# Patient Record
Sex: Female | Born: 1950 | Race: Black or African American | Hispanic: No | Smoking: Never smoker
Health system: Southern US, Community
[De-identification: ages and names within clinical notes are randomized; demographics above are authoritative.]

## PROBLEM LIST (undated history)

## (undated) DIAGNOSIS — I10 Essential (primary) hypertension: Secondary | ICD-10-CM

## (undated) DIAGNOSIS — G4730 Sleep apnea, unspecified: Secondary | ICD-10-CM

## (undated) DIAGNOSIS — E7800 Pure hypercholesterolemia, unspecified: Secondary | ICD-10-CM

## (undated) DIAGNOSIS — M1990 Unspecified osteoarthritis, unspecified site: Secondary | ICD-10-CM

## (undated) HISTORY — PX: FRACTURE SURGERY: SHX138

## (undated) HISTORY — PX: ABDOMINAL HYSTERECTOMY: SHX81

---

## 2000-08-11 ENCOUNTER — Emergency Department (HOSPITAL_COMMUNITY): Admission: EM | Admit: 2000-08-11 | Discharge: 2000-08-11 | Payer: Self-pay | Admitting: *Deleted

## 2000-12-22 ENCOUNTER — Encounter: Payer: Self-pay | Admitting: Family Medicine

## 2000-12-22 ENCOUNTER — Ambulatory Visit (HOSPITAL_COMMUNITY): Admission: RE | Admit: 2000-12-22 | Discharge: 2000-12-22 | Payer: Self-pay | Admitting: Family Medicine

## 2001-02-09 ENCOUNTER — Emergency Department (HOSPITAL_COMMUNITY): Admission: EM | Admit: 2001-02-09 | Discharge: 2001-02-10 | Payer: Self-pay | Admitting: *Deleted

## 2001-04-02 ENCOUNTER — Emergency Department (HOSPITAL_COMMUNITY): Admission: EM | Admit: 2001-04-02 | Discharge: 2001-04-02 | Payer: Self-pay | Admitting: Emergency Medicine

## 2001-04-02 ENCOUNTER — Encounter: Payer: Self-pay | Admitting: *Deleted

## 2001-12-24 ENCOUNTER — Ambulatory Visit (HOSPITAL_COMMUNITY): Admission: RE | Admit: 2001-12-24 | Discharge: 2001-12-24 | Payer: Self-pay | Admitting: Family Medicine

## 2001-12-24 ENCOUNTER — Encounter: Payer: Self-pay | Admitting: Family Medicine

## 2002-03-01 ENCOUNTER — Ambulatory Visit (HOSPITAL_BASED_OUTPATIENT_CLINIC_OR_DEPARTMENT_OTHER): Admission: RE | Admit: 2002-03-01 | Discharge: 2002-03-01 | Payer: Self-pay | Admitting: Orthopedic Surgery

## 2002-03-17 ENCOUNTER — Encounter (HOSPITAL_COMMUNITY): Admission: RE | Admit: 2002-03-17 | Discharge: 2002-04-16 | Payer: Self-pay | Admitting: Orthopedic Surgery

## 2002-04-18 ENCOUNTER — Encounter (HOSPITAL_COMMUNITY): Admission: RE | Admit: 2002-04-18 | Discharge: 2002-05-18 | Payer: Self-pay | Admitting: Orthopedic Surgery

## 2002-05-23 ENCOUNTER — Encounter (HOSPITAL_COMMUNITY): Admission: RE | Admit: 2002-05-23 | Discharge: 2002-06-22 | Payer: Self-pay | Admitting: Orthopedic Surgery

## 2002-06-22 ENCOUNTER — Encounter (HOSPITAL_COMMUNITY): Admission: RE | Admit: 2002-06-22 | Discharge: 2002-07-22 | Payer: Self-pay | Admitting: Orthopedic Surgery

## 2003-03-13 ENCOUNTER — Ambulatory Visit (HOSPITAL_COMMUNITY): Admission: RE | Admit: 2003-03-13 | Discharge: 2003-03-13 | Payer: Self-pay | Admitting: Family Medicine

## 2003-03-19 ENCOUNTER — Emergency Department (HOSPITAL_COMMUNITY): Admission: EM | Admit: 2003-03-19 | Discharge: 2003-03-19 | Payer: Self-pay | Admitting: Emergency Medicine

## 2004-05-09 ENCOUNTER — Ambulatory Visit (HOSPITAL_COMMUNITY): Admission: RE | Admit: 2004-05-09 | Discharge: 2004-05-09 | Payer: Self-pay | Admitting: Family Medicine

## 2004-06-08 ENCOUNTER — Emergency Department (HOSPITAL_COMMUNITY): Admission: EM | Admit: 2004-06-08 | Discharge: 2004-06-08 | Payer: Self-pay | Admitting: *Deleted

## 2004-07-30 ENCOUNTER — Emergency Department (HOSPITAL_COMMUNITY): Admission: EM | Admit: 2004-07-30 | Discharge: 2004-07-30 | Payer: Self-pay | Admitting: *Deleted

## 2005-03-04 ENCOUNTER — Ambulatory Visit (HOSPITAL_COMMUNITY): Admission: RE | Admit: 2005-03-04 | Discharge: 2005-03-04 | Payer: Self-pay | Admitting: Family Medicine

## 2005-03-24 ENCOUNTER — Emergency Department (HOSPITAL_COMMUNITY): Admission: EM | Admit: 2005-03-24 | Discharge: 2005-03-24 | Payer: Self-pay | Admitting: Emergency Medicine

## 2005-04-04 ENCOUNTER — Ambulatory Visit (HOSPITAL_COMMUNITY): Admission: RE | Admit: 2005-04-04 | Discharge: 2005-04-04 | Payer: Self-pay | Admitting: Family Medicine

## 2005-05-19 ENCOUNTER — Emergency Department (HOSPITAL_COMMUNITY): Admission: EM | Admit: 2005-05-19 | Discharge: 2005-05-19 | Payer: Self-pay | Admitting: Emergency Medicine

## 2005-07-04 ENCOUNTER — Ambulatory Visit (HOSPITAL_COMMUNITY): Admission: RE | Admit: 2005-07-04 | Discharge: 2005-07-04 | Payer: Self-pay | Admitting: Family Medicine

## 2005-07-22 ENCOUNTER — Ambulatory Visit: Admission: RE | Admit: 2005-07-22 | Discharge: 2005-07-22 | Payer: Self-pay | Admitting: Family Medicine

## 2005-08-07 ENCOUNTER — Ambulatory Visit: Payer: Self-pay | Admitting: Pulmonary Disease

## 2006-05-21 IMAGING — US US ABDOMEN COMPLETE
1 series · 14 of 25 positions shown · non-contrast
Comparison: none

CLINICAL DATA: Abdominal pain.
 ABDOMEN ULTRASOUND:
TECHNIQUE: Complete abdominal ultrasound examination was performed including evaluation of the liver, gallbladder, bile ducts, pancreas, kidneys, spleen, IVC, and abdominal aorta.

[Series 1: unknown · 0.33mm/px · 14 of 58 slices shown]
[im 1/58]
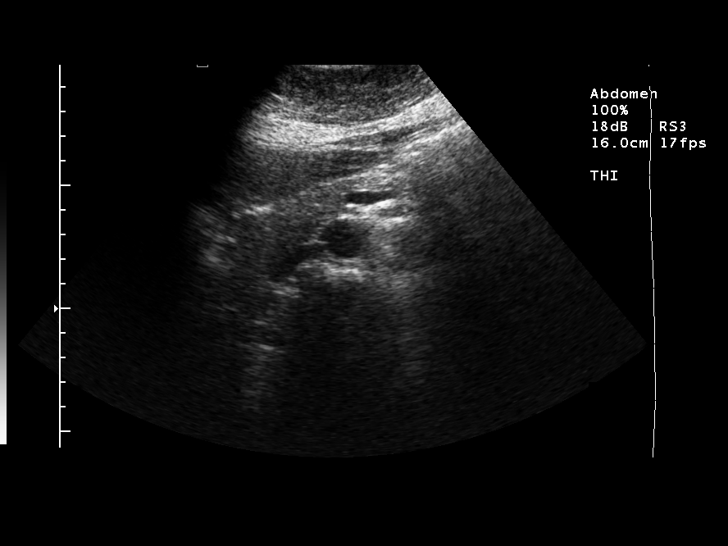
[im 5/58]
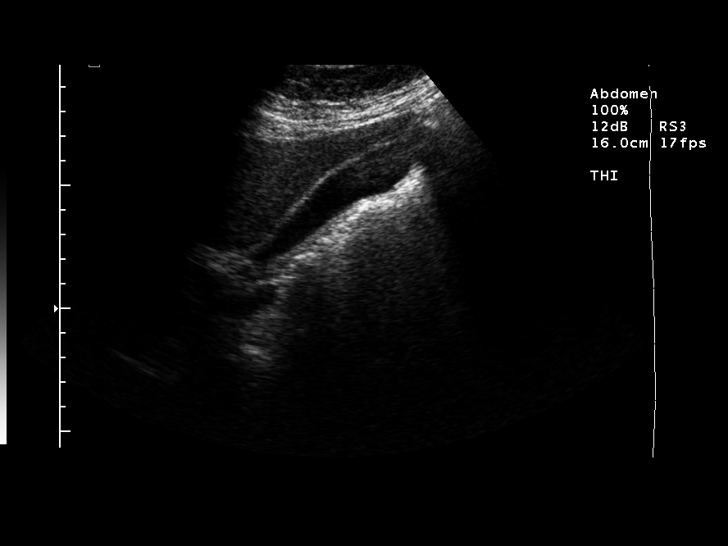
[im 10/58]
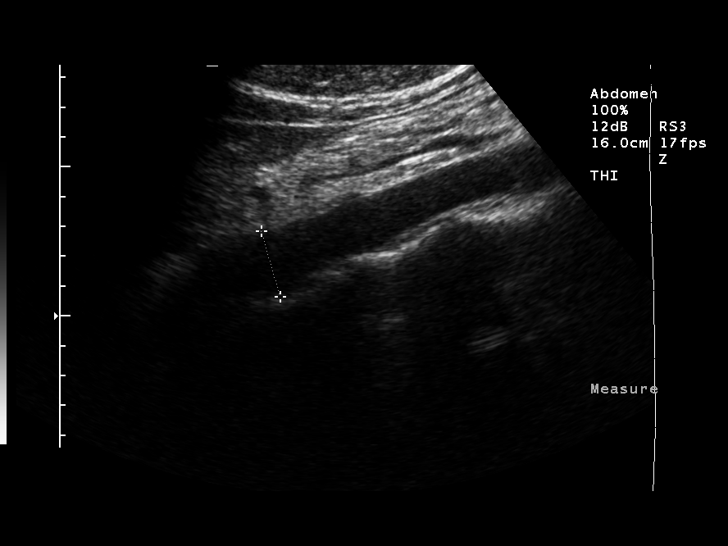
[im 15/58]
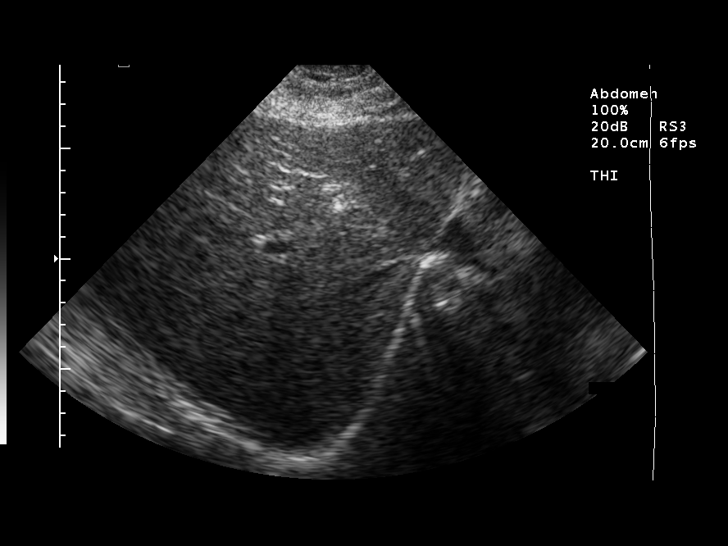
[im 20/58]
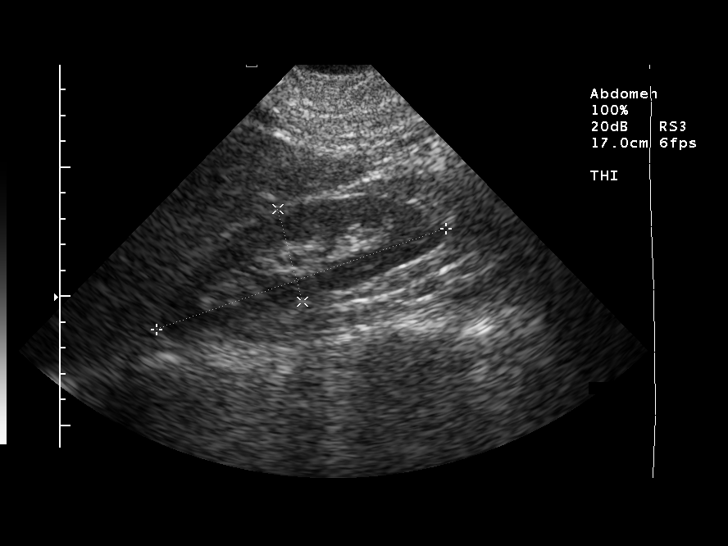
[im 22/58]
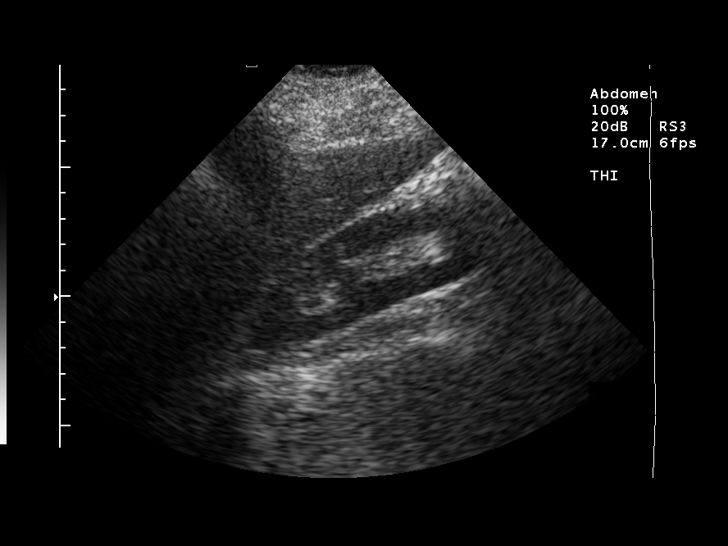
[im 27/58]
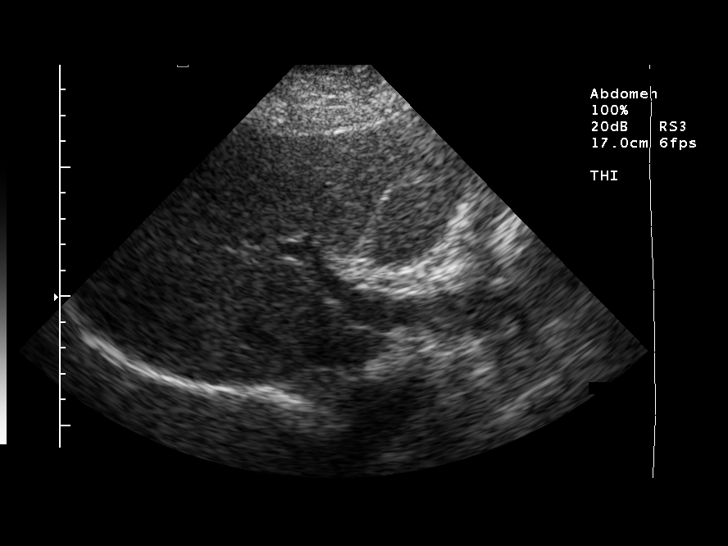
[im 31/58]
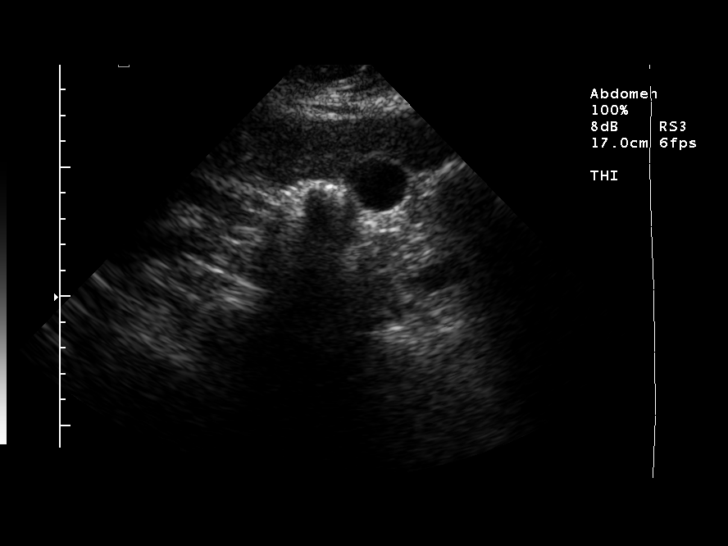
[im 36/58]
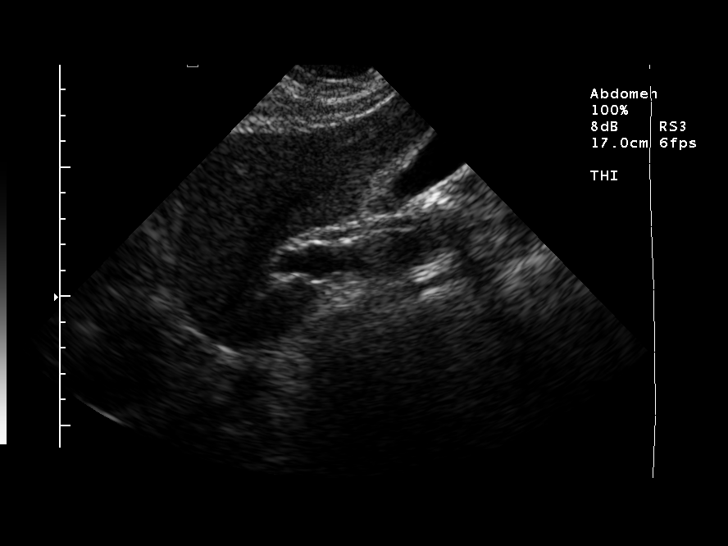
[im 39/58]
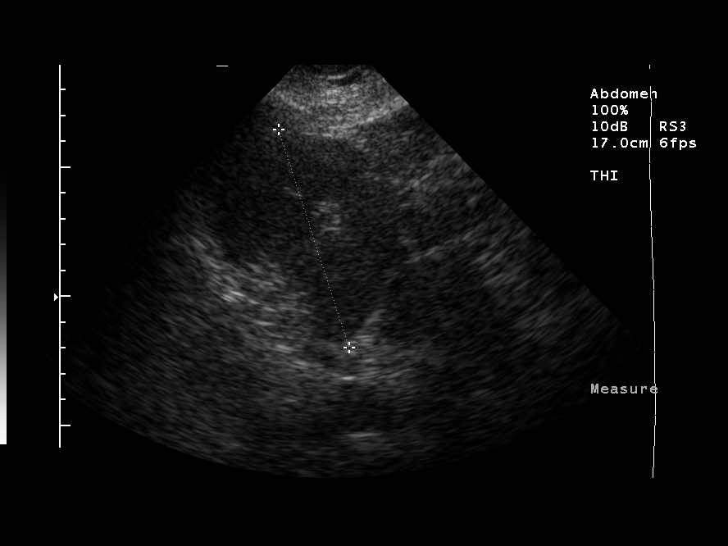
[im 43/58]
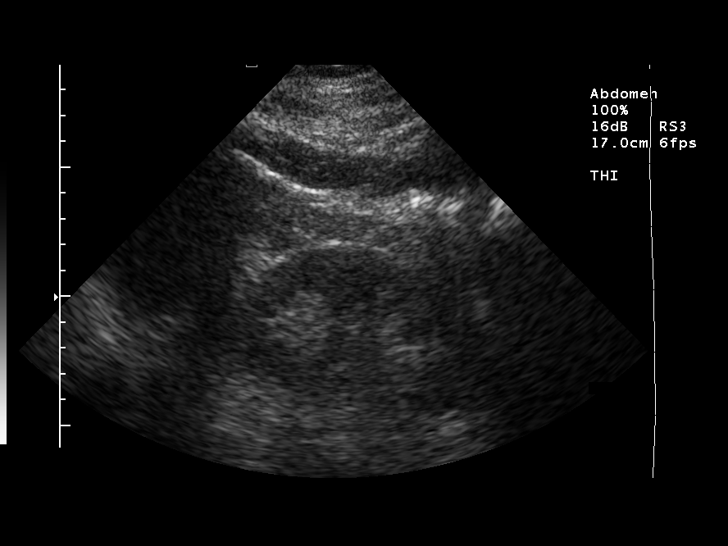
[im 48/58]
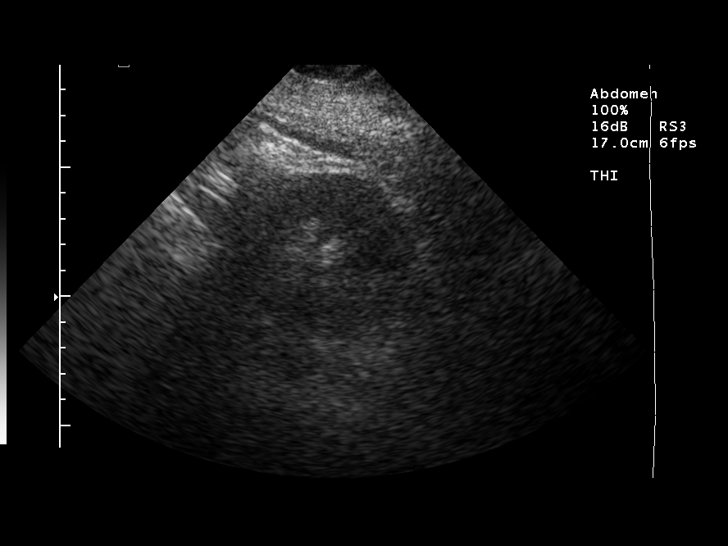
[im 53/58]
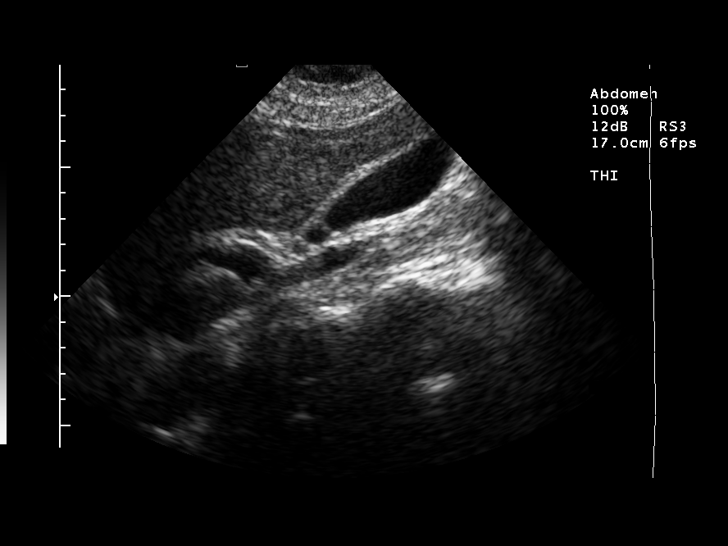
[im 58/58]
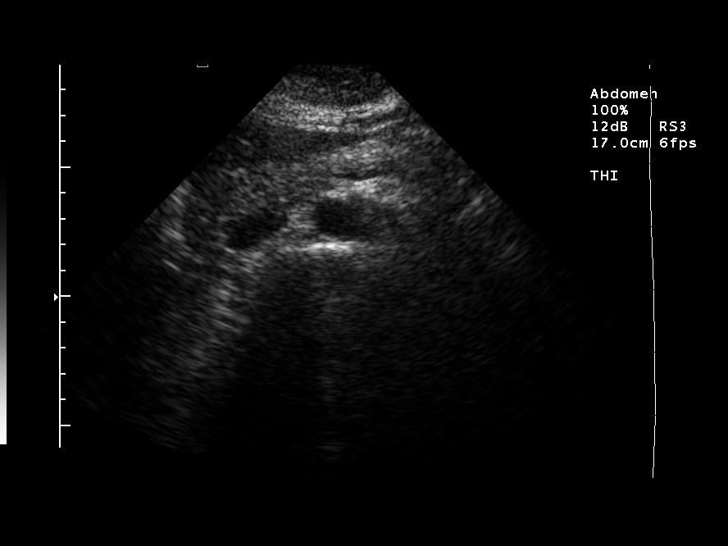

[14 of 25 positions shown; findings below may reference images not displayed]

FINDINGS: Multiple scans of the abdomen are made and show the gallbladder to be normal with a wall thickness of 2.6 mm.  The common bile duct is normal and measures 5.2 mm in diameter.  The liver, inferior vena cava and pancreas are normal.  Spleen is normal and measures 8.7 cm.  The right kidney is normal and measures 11.8 cm.  The left kidney is normal and measures 11.2 cm.  Abdominal aorta is normal and measures 2.3 cm.
IMPRESSION: Normal complete abdominal ultrasound.

## 2007-01-28 ENCOUNTER — Emergency Department (HOSPITAL_COMMUNITY): Admission: EM | Admit: 2007-01-28 | Discharge: 2007-01-28 | Payer: Self-pay | Admitting: Emergency Medicine

## 2007-02-16 ENCOUNTER — Ambulatory Visit (HOSPITAL_COMMUNITY): Admission: RE | Admit: 2007-02-16 | Discharge: 2007-02-16 | Payer: Self-pay | Admitting: Family Medicine

## 2007-03-14 ENCOUNTER — Emergency Department (HOSPITAL_COMMUNITY): Admission: EM | Admit: 2007-03-14 | Discharge: 2007-03-14 | Payer: Self-pay | Admitting: Emergency Medicine

## 2007-03-15 ENCOUNTER — Emergency Department (HOSPITAL_COMMUNITY): Admission: EM | Admit: 2007-03-15 | Discharge: 2007-03-15 | Payer: Self-pay | Admitting: Emergency Medicine

## 2007-05-20 ENCOUNTER — Emergency Department (HOSPITAL_COMMUNITY): Admission: EM | Admit: 2007-05-20 | Discharge: 2007-05-20 | Payer: Self-pay | Admitting: Emergency Medicine

## 2007-06-22 ENCOUNTER — Emergency Department (HOSPITAL_COMMUNITY): Admission: EM | Admit: 2007-06-22 | Discharge: 2007-06-22 | Payer: Self-pay | Admitting: Emergency Medicine

## 2007-06-25 ENCOUNTER — Emergency Department (HOSPITAL_COMMUNITY): Admission: EM | Admit: 2007-06-25 | Discharge: 2007-06-25 | Payer: Self-pay | Admitting: Emergency Medicine

## 2007-11-28 ENCOUNTER — Emergency Department (HOSPITAL_COMMUNITY): Admission: EM | Admit: 2007-11-28 | Discharge: 2007-11-28 | Payer: Self-pay | Admitting: Emergency Medicine

## 2008-03-15 ENCOUNTER — Ambulatory Visit (HOSPITAL_COMMUNITY): Admission: RE | Admit: 2008-03-15 | Discharge: 2008-03-15 | Payer: Self-pay | Admitting: Family Medicine

## 2008-10-02 ENCOUNTER — Ambulatory Visit (HOSPITAL_COMMUNITY): Admission: RE | Admit: 2008-10-02 | Discharge: 2008-10-02 | Payer: Self-pay | Admitting: Pulmonary Disease

## 2008-10-16 ENCOUNTER — Ambulatory Visit (HOSPITAL_COMMUNITY): Admission: RE | Admit: 2008-10-16 | Discharge: 2008-10-16 | Payer: Self-pay | Admitting: Pulmonary Disease

## 2008-10-21 ENCOUNTER — Emergency Department (HOSPITAL_COMMUNITY): Admission: EM | Admit: 2008-10-21 | Discharge: 2008-10-21 | Payer: Self-pay | Admitting: Emergency Medicine

## 2009-03-19 ENCOUNTER — Emergency Department (HOSPITAL_COMMUNITY): Admission: EM | Admit: 2009-03-19 | Discharge: 2009-03-19 | Payer: Self-pay | Admitting: Emergency Medicine

## 2009-04-16 ENCOUNTER — Ambulatory Visit (HOSPITAL_COMMUNITY): Admission: RE | Admit: 2009-04-16 | Discharge: 2009-04-16 | Payer: Self-pay | Admitting: Family Medicine

## 2009-04-24 ENCOUNTER — Emergency Department (HOSPITAL_COMMUNITY): Admission: EM | Admit: 2009-04-24 | Discharge: 2009-04-24 | Payer: Self-pay | Admitting: Emergency Medicine

## 2009-09-25 ENCOUNTER — Emergency Department (HOSPITAL_COMMUNITY): Admission: EM | Admit: 2009-09-25 | Discharge: 2009-09-25 | Payer: Self-pay | Admitting: Emergency Medicine

## 2010-03-20 ENCOUNTER — Emergency Department (HOSPITAL_COMMUNITY)
Admission: EM | Admit: 2010-03-20 | Discharge: 2010-03-20 | Disposition: A | Discharge status: Home / Self Care | Payer: Self-pay | Attending: Emergency Medicine | Admitting: Emergency Medicine

## 2010-03-20 DIAGNOSIS — M7660 Achilles tendinitis, unspecified leg: Secondary | ICD-10-CM | POA: Insufficient documentation

## 2010-03-20 DIAGNOSIS — I10 Essential (primary) hypertension: Secondary | ICD-10-CM | POA: Insufficient documentation

## 2010-03-20 DIAGNOSIS — M25579 Pain in unspecified ankle and joints of unspecified foot: Secondary | ICD-10-CM | POA: Insufficient documentation

## 2010-03-23 ENCOUNTER — Emergency Department (HOSPITAL_COMMUNITY)
Admission: EM | Admit: 2010-03-23 | Discharge: 2010-03-23 | Disposition: A | Discharge status: Home / Self Care | Payer: Self-pay | Attending: Emergency Medicine | Admitting: Emergency Medicine

## 2010-03-23 DIAGNOSIS — H538 Other visual disturbances: Secondary | ICD-10-CM | POA: Insufficient documentation

## 2010-03-23 DIAGNOSIS — J45909 Unspecified asthma, uncomplicated: Secondary | ICD-10-CM | POA: Insufficient documentation

## 2010-03-23 DIAGNOSIS — I10 Essential (primary) hypertension: Secondary | ICD-10-CM | POA: Insufficient documentation

## 2010-03-23 DIAGNOSIS — Z79899 Other long term (current) drug therapy: Secondary | ICD-10-CM | POA: Insufficient documentation

## 2010-04-08 ENCOUNTER — Other Ambulatory Visit (HOSPITAL_COMMUNITY): Payer: Self-pay | Admitting: Family Medicine

## 2010-04-08 DIAGNOSIS — Z139 Encounter for screening, unspecified: Secondary | ICD-10-CM

## 2010-05-03 ENCOUNTER — Ambulatory Visit (HOSPITAL_COMMUNITY)
Admission: RE | Admit: 2010-05-03 | Discharge: 2010-05-03 | Disposition: A | Discharge status: Home / Self Care | Payer: PRIVATE HEALTH INSURANCE | Source: Ambulatory Visit | Attending: Family Medicine | Admitting: Family Medicine

## 2010-05-03 DIAGNOSIS — Z139 Encounter for screening, unspecified: Secondary | ICD-10-CM

## 2010-05-03 DIAGNOSIS — Z1231 Encounter for screening mammogram for malignant neoplasm of breast: Secondary | ICD-10-CM | POA: Insufficient documentation

## 2010-05-10 LAB — BLOOD GAS, ARTERIAL
Acid-Base Excess: 1.3 mmol/L (ref 0.0–2.0)
Bicarbonate: 25 mEq/L — ABNORMAL HIGH (ref 20.0–24.0)
FIO2: 0.21 %
O2 Saturation: 97.1 %
Patient temperature: 37
TCO2: 21.8 mmol/L (ref 0–100)
pCO2 arterial: 37.4 mmHg (ref 35.0–45.0)

## 2010-06-21 NOTE — Op Note (Signed)
Sheri Fleming, Sheri Fleming                         ACCOUNT NO.:  [id number]   MEDICAL RECORD NO.:  [id number]                   PATIENT TYPE:  AMB   LOCATION:  DSC                                  FACILITY:  [facility]   PHYSICIAN:  Leonides Grills, M.D.                  DATE OF BIRTH:  [birth year]   DATE OF PROCEDURE:  03/01/2002  DATE OF DISCHARGE:                                 OPERATIVE REPORT   PREOPERATIVE DIAGNOSES:  1. Left ankle complication of hardware, status post open reduction and     internal fixation bimalleolar ankle fracture.  2. Left dorsal medial talar spurs.  3. Left anterior medial tibial spurs.   POSTOPERATIVE DIAGNOSES:  1. Left ankle complication of hardware, status post open reduction and     internal fixation bimalleolar ankle fracture.  2. Left dorsal medial talar spurs.  3. Left anterior medial tibial spurs.  4. Anterior talar dome osteochondral lesion.   PROCEDURE:  1. Hardware removal deep left ankle.  2. Excision of left tibial spurs.  3. Excision of left talar spurs.  4. Left ankle arthrotomy with drilling of anterior medial talar dome     osteochondral lesion.   SURGEON:  Leonides Grills, M.D.   ASSISTANT:  Lianne Cure, P.A.   ANESTHESIA:  General endotracheal.   ESTIMATED BLOOD LOSS:  Minimal.   TOURNIQUET TIME:  Approximately 1/2 hour.   COMPLICATIONS:  Stable.   INDICATIONS:  This is a 60 year old female who has had longstanding ankle  pain since she has had open reduction and internal fixation of a bimalleolar  ankle fracture.  She was consented for the above procedure.  All risks which  include infection, neurovascular injury, stiffness, arthritis, persistent  pain, worsening of pain, possible ankle fusion in the future were all  explained.  Questions were encouraged and answered.   DESCRIPTION OF PROCEDURE:  The patient was brought to the operating room and  placed in the supine position.  After adequate general endotracheal  anesthesia was administered as well as Ancef 1 g IV piggyback, the left  lower extremity was then prepped and draped in a sterile manner with a  proximally placed thigh tourniquet.  The left limb was gravity  exsanguinated, and the tourniquet was elevated to 290 mmHg.  We went through  the previous medial approach that was used for open reduction internal  fixation of her medial malleolar ankle fracture.  Dissection was carried  down to the medial malleolus.  Hemostasis was obtained.  Soft tissues were  elevated anteriorly to the medial malleolus.  There was a large spur on the  dorsal medial aspect of the talar dome that was impinging the corner of the  tibia, and there was a kissing osteophyte off the tibia as well that  extended along the entire anterior aspect of the medial malleolus, tibia  region down to the corner where the screw and  wire were removed with needle-  nose pliers and screwdrivers, respectively.  Both were intact without any  evidence of hardware failure.  We then with the curved 0.25 inch osteotome,  these spurs were removed off the tibia and talus, respectively.  There was  an approximately 4 x 3 mm osteochondral lesion on the anterior medial aspect  of the talar dome with a 6Q K-wire.  Multiple drill holes were then placed  in this area as well.  The wound was copiously irrigated with normal saline.  All loose bodies and debris were removed meteciously with rongeur and  irrigation.  The tourniquet was deflated.  Hemostasis was obtained.  The  wound was closed in layers with 3-0 Vicryl and 4-0 nylon, respectively.  Cam  walker boot was applied.  Sterile dressing was applied.  The patient was  stable to the PR.                                                   Leonides Grills, M.D.    PB/MEDQ  D:  03/01/2002  T:  03/01/2002  Job:  [id number]

## 2010-06-21 NOTE — Procedures (Signed)
Sheri Fleming, Sheri Fleming               ACCOUNT NO.:  [id number]   MEDICAL RECORD NO.:  [id number]          PATIENT TYPE:  OUT   LOCATION:  SLEEP LAB                     FACILITY:  [facility]   PHYSICIAN:  Marcelyn Bruins, M.D. [facility] DATE OF BIRTH:  [birth year]   DATE OF STUDY:  07/22/2005                              NOCTURNAL POLYSOMNOGRAM   REFERRING PHYSICIAN:  Dr. Lilyan Punt   INDICATION FOR STUDY:  Hypersomnia with sleep apnea.   EPWORTH SCORE:  13.   SLEEP ARCHITECTURE:  The patient had a total sleep time of 316 minutes with  significantly decreased slow wave sleep as well as REM.  Sleep onset latency  was normal and REM onset was prolonged at 164 minutes.  Sleep efficiency was  decreased at 74%.   RESPIRATORY DATA:  Patient was found to have 56 hypopneas and 43 apneas for  a respiratory disturbance index of 19 events per hour.  The events occurred  in all body positions, but were more common in the supine position.  There  was moderate to loud snoring noted throughout.   OXYGEN DATA:  The patient had O2 desaturation as low as 88% with her  obstructive events.   CARDIAC DATA:  Frequent PACs were noted throughout.   MOVEMENT/PARASOMNIA:  Patient was found to have small numbers of leg jerks  with little to no sleep disruption.   IMPRESSION/RECOMMENDATION:  1.  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  The patient did not meet split night criteria secondary      to the majority of her events occurring well after 2 a.m.  Treatment for      this degree of sleep apnea should focus on weight loss alone if      applicable, upper airway surgery, oral appliance, and also CPAP.      Clinical correlation is      suggested.  2.  Frequent PACs noted throughout.                                          ______________________________  Marcelyn Bruins, M.D. [facility]  Diplomate,  American Board of Sleep  Medicine    KC/MEDQ  D:  08/08/2005 11:33:56  T:  08/08/2005 12:13:55  Job:  [id number]

## 2010-10-30 LAB — BASIC METABOLIC PANEL
BUN: 12
Calcium: 8.8
Creatinine, Ser: 0.78

## 2010-10-30 LAB — DIFFERENTIAL
Basophils Absolute: 0
Basophils Relative: 0
Lymphocytes Relative: 5 — ABNORMAL LOW
Monocytes Absolute: 0.2
Monocytes Relative: 3
Neutro Abs: 6.2

## 2010-10-30 LAB — URINALYSIS, ROUTINE W REFLEX MICROSCOPIC
Glucose, UA: NEGATIVE
Ketones, ur: NEGATIVE
Nitrite: NEGATIVE
Protein, ur: NEGATIVE
Urobilinogen, UA: 0.2
pH: 6

## 2010-10-30 LAB — URINE MICROSCOPIC-ADD ON

## 2010-10-30 LAB — CBC
HCT: 39.8
Hemoglobin: 13.9
RBC: 4.56
RDW: 12.7
WBC: 6.8

## 2010-12-27 ENCOUNTER — Emergency Department (HOSPITAL_COMMUNITY): Payer: Self-pay

## 2010-12-27 ENCOUNTER — Emergency Department (HOSPITAL_COMMUNITY)
Admission: EM | Admit: 2010-12-27 | Discharge: 2010-12-28 | Disposition: A | Discharge status: Home / Self Care | Payer: Self-pay | Attending: Emergency Medicine | Admitting: Emergency Medicine

## 2010-12-27 DIAGNOSIS — J45909 Unspecified asthma, uncomplicated: Secondary | ICD-10-CM | POA: Insufficient documentation

## 2010-12-27 DIAGNOSIS — J40 Bronchitis, not specified as acute or chronic: Secondary | ICD-10-CM | POA: Insufficient documentation

## 2010-12-27 DIAGNOSIS — I10 Essential (primary) hypertension: Secondary | ICD-10-CM | POA: Insufficient documentation

## 2010-12-27 HISTORY — DX: Essential (primary) hypertension: I10

## 2010-12-27 MED ORDER — IPRATROPIUM BROMIDE 0.02 % IN SOLN
0.5000 mg | Freq: Once | RESPIRATORY_TRACT | Status: AC
  Administered 2010-12-27: 0.5 mg via RESPIRATORY_TRACT
  Filled 2010-12-27: qty 2.5

## 2010-12-27 MED ORDER — ALBUTEROL SULFATE (5 MG/ML) 0.5% IN NEBU
5.0000 mg | INHALATION_SOLUTION | Freq: Once | RESPIRATORY_TRACT | Status: AC
  Administered 2010-12-27: 5 mg via RESPIRATORY_TRACT
  Filled 2010-12-27: qty 1

## 2010-12-27 NOTE — ED Notes (Signed)
Pt reports productive cough recently, using nebs and inhalers without relief.

## 2010-12-27 NOTE — ED Provider Notes (Signed)
History     CSN: [id number] Arrival date & time: 12/27/2010 11:15 PM   First MD Initiated Contact with Patient 12/27/10 2316      Chief Complaint  Patient presents with  . Cough  . Wheezing    (Consider location/radiation/quality/duration/timing/severity/associated sxs/prior treatment) HPI Pt presents with c/o asthma flare.  She states she has had URI symptom with nasal congestion and cough productive of yellow sputum.  Has been using albuterol twice daily with some relief.  Has run out of her singulair.  No fever/chills.  No vomiting, no abdominal pain or chest pain.   Has continued to drink liquids well.  No leg swelling.  Albuterol seems to help her symptoms, nothing makes them worse.  No other systemic symptoms.  Has a hx of prior asthma exacerbations- has not been intubated, last steriods approx 1 year ago.  Past Medical History  Diagnosis Date  . Asthma   . Hypertension     History reviewed. No pertinent past surgical history.  No family history on file.  History  Substance Use Topics  . Smoking status: Never Smoker   . Smokeless tobacco: Not on file  . Alcohol Use: No    OB History    Grav Para Term Preterm Abortions TAB SAB Ect Mult Living                  Review of Systems ROS reviewed and otherwise negative except for mentioned in HPI  Allergies  Review of patient's allergies indicates no known allergies.  Home Medications   Current Outpatient Rx  Name Route Sig Dispense Refill  . ALBUTEROL SULFATE (2.5 MG/3ML) 0.083% IN NEBU Nebulization Take 2.5 mg by nebulization every 6 (six) hours as needed.      Marland Kitchen MONTELUKAST SODIUM 10 MG PO TABS Oral Take 10 mg by mouth at bedtime.      Marland Kitchen MONTELUKAST SODIUM 10 MG PO TABS Oral Take 1 tablet (10 mg total) by mouth at bedtime. 30 tablet 0    BP 192/107  Pulse 87  Temp(Src) 98.3 F (36.8 C) (Oral)  Resp 20  Ht 5' 3.5" (1.613 m)  Wt 180 lb (81.647 kg)  BMI 31.39 kg/m2  SpO2 99% Vitals reviewed Physical  Exam Physical Examination: General appearance - alert, well appearing, and in no distress Mental status - alert, oriented to person, place, and time Eyes - pupils equal and reactive, no conjunctival injection Mouth - mucous membranes moist, pharynx normal without lesions Lymphatics - no palpable lymphadenopathy Chest - clear to auscultation, no wheezes, rales or rhonchi, symmetric air entry Heart - normal rate, regular rhythm, normal S1, S2, no murmurs, rubs, clicks or gallops Abdomen - soft, nontender, nondistended, no masses or organomegaly Neurological - motor and sensory grossly normal bilaterally Musculoskeletal - no joint tenderness, deformity or swelling Extremities - peripheral pulses normal, no pedal edema, no clubbing or cyanosis Skin - normal coloration and turgor, no rashes  ED Course  Procedures (including critical care time)   Labs Reviewed  POCT I-STAT, CHEM 8  I-STAT, CHEM 8   Dg Chest 2 View  12/27/2010  *RADIOLOGY REPORT*  Clinical Data: Wheezing, productive cough and congestion; history of asthma.  CHEST - 2 VIEW  Comparison: Chest radiograph performed 04/24/2009  Findings: The lungs are well-aerated and clear.  There is no evidence of focal opacification, pleural effusion or pneumothorax.  The heart is normal in size; the mediastinal contour is within normal limits.  No acute osseous abnormalities are seen.  There is elevation of the left hemidiaphragm.  IMPRESSION: No acute cardiopulmonary process seen.  Original Report Authenticated By: Tonia Ghent, M.D.     1. Bronchitis   2. Asthma   3. Hypertension       MDM  Pt with URI symptoms and sensation of asthma exacerbation- she also c/o productive cough.  CXR was negative for acute process.  Pt without wheezing on exam- has been using albuterol only twice daily- no indication for steroid therapy at this time- advised patient to maximize bronchidilator therapy.  She is also hypertensive- istat reassuring- no  signs of end organ damage.  She also needs a refill of her singulair.  Discharged with strict return precautions.  Pt agreeable with plan.         Ethelda Chick, MD 12/28/10 [phone]

## 2010-12-28 LAB — POCT I-STAT, CHEM 8
Chloride: 104 mEq/L (ref 96–112)
Hemoglobin: 12.9 g/dL (ref 12.0–15.0)
Potassium: 3.9 mEq/L (ref 3.5–5.1)
Sodium: 142 mEq/L (ref 135–145)

## 2010-12-28 MED ORDER — MONTELUKAST SODIUM 10 MG PO TABS: 10.0000 mg | ORAL_TABLET | Freq: Every day | ORAL | Status: DC

## 2011-05-08 ENCOUNTER — Other Ambulatory Visit (HOSPITAL_COMMUNITY): Payer: Self-pay | Admitting: Physician Assistant

## 2011-05-08 DIAGNOSIS — Z1231 Encounter for screening mammogram for malignant neoplasm of breast: Secondary | ICD-10-CM

## 2011-05-12 ENCOUNTER — Ambulatory Visit (HOSPITAL_COMMUNITY)
Admission: RE | Admit: 2011-05-12 | Discharge: 2011-05-12 | Disposition: A | Discharge status: Home / Self Care | Payer: Self-pay | Source: Ambulatory Visit | Attending: Physician Assistant | Admitting: Physician Assistant

## 2011-05-12 DIAGNOSIS — Z1231 Encounter for screening mammogram for malignant neoplasm of breast: Secondary | ICD-10-CM

## 2012-06-01 ENCOUNTER — Emergency Department (HOSPITAL_COMMUNITY): Payer: Self-pay

## 2012-06-01 ENCOUNTER — Emergency Department (HOSPITAL_COMMUNITY)
Admission: EM | Admit: 2012-06-01 | Discharge: 2012-06-01 | Disposition: A | Discharge status: Home / Self Care | Payer: Self-pay | Attending: Emergency Medicine | Admitting: Emergency Medicine

## 2012-06-01 ENCOUNTER — Encounter (HOSPITAL_COMMUNITY): Payer: Self-pay | Admitting: *Deleted

## 2012-06-01 DIAGNOSIS — I10 Essential (primary) hypertension: Secondary | ICD-10-CM | POA: Insufficient documentation

## 2012-06-01 DIAGNOSIS — Z8639 Personal history of other endocrine, nutritional and metabolic disease: Secondary | ICD-10-CM | POA: Insufficient documentation

## 2012-06-01 DIAGNOSIS — R05 Cough: Secondary | ICD-10-CM | POA: Insufficient documentation

## 2012-06-01 DIAGNOSIS — Z862 Personal history of diseases of the blood and blood-forming organs and certain disorders involving the immune mechanism: Secondary | ICD-10-CM | POA: Insufficient documentation

## 2012-06-01 DIAGNOSIS — J209 Acute bronchitis, unspecified: Secondary | ICD-10-CM | POA: Insufficient documentation

## 2012-06-01 DIAGNOSIS — J309 Allergic rhinitis, unspecified: Secondary | ICD-10-CM | POA: Insufficient documentation

## 2012-06-01 DIAGNOSIS — J302 Other seasonal allergic rhinitis: Secondary | ICD-10-CM

## 2012-06-01 DIAGNOSIS — R059 Cough, unspecified: Secondary | ICD-10-CM | POA: Insufficient documentation

## 2012-06-01 DIAGNOSIS — Z79899 Other long term (current) drug therapy: Secondary | ICD-10-CM | POA: Insufficient documentation

## 2012-06-01 DIAGNOSIS — J45901 Unspecified asthma with (acute) exacerbation: Secondary | ICD-10-CM | POA: Insufficient documentation

## 2012-06-01 HISTORY — DX: Pure hypercholesterolemia, unspecified: E78.00

## 2012-06-01 HISTORY — DX: Sleep apnea, unspecified: G47.30

## 2012-06-01 MED ORDER — PREDNISONE 10 MG PO TABS: 20.0000 mg | ORAL_TABLET | Freq: Every day | ORAL | Status: DC

## 2012-06-01 MED ORDER — ALBUTEROL SULFATE (2.5 MG/3ML) 0.083% IN NEBU: 2.5000 mg | INHALATION_SOLUTION | Freq: Four times a day (QID) | RESPIRATORY_TRACT | Status: DC | PRN

## 2012-06-01 MED ORDER — BENZONATATE 100 MG PO CAPS: 100.0000 mg | ORAL_CAPSULE | Freq: Three times a day (TID) | ORAL | Status: DC

## 2012-06-01 MED ORDER — LORATADINE 10 MG PO TABS: 10.0000 mg | ORAL_TABLET | Freq: Every day | ORAL | Status: DC

## 2012-06-01 NOTE — ED Notes (Signed)
Pt c/o cough that is productive with sputum, no color noted, runny nose, wheezing, has to use her inhalers more frequently without improvement at home, denies any pain.

## 2012-06-01 NOTE — ED Provider Notes (Deleted)
History     CSN: [id number]  Arrival date & time 06/01/12  1726   First MD Initiated Contact with Patient 06/01/12 1758      Chief Complaint  Patient presents with  . Asthma    (Consider location/radiation/quality/duration/timing/severity/associated sxs/prior treatment) HPI  Past Medical History  Diagnosis Date  . Asthma   . Hypertension   . Sleep apnea   . High cholesterol     Past Surgical History  Procedure Laterality Date  . Abdominal hysterectomy      No family history on file.  History  Substance Use Topics  . Smoking status: Never Smoker   . Smokeless tobacco: Not on file  . Alcohol Use: No    OB History   Grav Para Term Preterm Abortions TAB SAB Ect Mult Living                  Review of Systems  Allergies  Review of patient's allergies indicates no known allergies.  Home Medications   Current Outpatient Rx  Name  Route  Sig  Dispense  Refill  . albuterol (PROVENTIL) (2.5 MG/3ML) 0.083% nebulizer solution   Nebulization   Take 3 mLs (2.5 mg total) by nebulization every 6 (six) hours as needed.   75 mL   0   . benzonatate (TESSALON) 100 MG capsule   Oral   Take 1 capsule (100 mg total) by mouth every 8 (eight) hours.   21 capsule   0   . loratadine (CLARITIN) 10 MG tablet   Oral   Take 1 tablet (10 mg total) by mouth daily. One po daily x 5 days   5 tablet   0   . montelukast (SINGULAIR) 10 MG tablet   Oral   Take 10 mg by mouth at bedtime.           Marland Kitchen EXPIRED: montelukast (SINGULAIR) 10 MG tablet   Oral   Take 1 tablet (10 mg total) by mouth at bedtime.   30 tablet   0   . predniSONE (DELTASONE) 10 MG tablet   Oral   Take 2 tablets (20 mg total) by mouth daily.   10 tablet   0     BP 137/71  Pulse 82  Temp(Src) 98.5 F (36.9 C) (Oral)  Resp 20  SpO2 100%  Physical Exam  ED Course  Procedures (including critical care time)  Labs Reviewed - No data to display Dg Chest 2 View  06/01/2012  *RADIOLOGY REPORT*   Clinical Data: Productive cough.  Rhinorrhea.  Wheezing.  Asthma.  CHEST - 2 VIEW  Comparison: 12/27/2010.  Findings: Stable normal sized heart and left diaphragmatic eventration.  Clear lungs.  Mild central peribronchial thickening with mild progression.  Thoracic spine degenerative changes.  IMPRESSION: Mild bronchitic changes with mild progression.   Original Report Authenticated By: Beckie Salts, M.D.      1. Bronchitis with bronchospasm   2. Seasonal allergies       MDM  I personally performed the services described in this documentation, which was scribed in my presence. The recorded information has been reviewed and is accurate.          Loren Racer, MD 06/01/12 [phone]

## 2012-06-01 NOTE — ED Notes (Signed)
Pt alert & oriented x4, stable gait. Patient given discharge instructions, paperwork & prescription(s). Patient  instructed to stop at the registration desk to finish any additional paperwork. Patient verbalized understanding. Pt left department w/ no further questions. 

## 2012-06-01 NOTE — ED Provider Notes (Signed)
History  This chart was scribed for Loren Racer, MD, by Candelaria Stagers, ED Scribe. This patient was seen in room [facility] and the patient's care was started at 6:05 PM   CSN: [id number]  Arrival date & time 06/01/12  1726   First MD Initiated Contact with Patient 06/01/12 1758      Chief Complaint  Patient presents with  . Asthma     The history is provided by the patient. No language interpreter was used.   Sheri Fleming is a 62 y.o. female who presents to the Emergency Department complaining of  exacerbation of asthma that started today including wheezing and productive cough.  Pt has h/o asthma and reports worsening symptoms with seasonal allergies.  Pt has used prescribed inhaler with some improvements.  She reports she has home breathing treatments, but is out of the medication.    Past Medical History  Diagnosis Date  . Asthma   . Hypertension   . Sleep apnea   . High cholesterol     Past Surgical History  Procedure Laterality Date  . Abdominal hysterectomy      No family history on file.  History  Substance Use Topics  . Smoking status: Never Smoker   . Smokeless tobacco: Not on file  . Alcohol Use: No    OB History   Grav Para Term Preterm Abortions TAB SAB Ect Mult Living                  Review of Systems  Respiratory: Positive for cough and wheezing.   All other systems reviewed and are negative.    Allergies  Review of patient's allergies indicates no known allergies.  Home Medications   Current Outpatient Rx  Name  Route  Sig  Dispense  Refill  . albuterol (PROVENTIL) (2.5 MG/3ML) 0.083% nebulizer solution   Nebulization   Take 3 mLs (2.5 mg total) by nebulization every 6 (six) hours as needed.   75 mL   0   . benzonatate (TESSALON) 100 MG capsule   Oral   Take 1 capsule (100 mg total) by mouth every 8 (eight) hours.   21 capsule   0   . loratadine (CLARITIN) 10 MG tablet   Oral   Take 1 tablet (10 mg total) by mouth  daily. One po daily x 5 days   5 tablet   0   . montelukast (SINGULAIR) 10 MG tablet   Oral   Take 10 mg by mouth at bedtime.           Marland Kitchen EXPIRED: montelukast (SINGULAIR) 10 MG tablet   Oral   Take 1 tablet (10 mg total) by mouth at bedtime.   30 tablet   0   . predniSONE (DELTASONE) 10 MG tablet   Oral   Take 2 tablets (20 mg total) by mouth daily.   10 tablet   0     BP 137/71  Pulse 82  Temp(Src) 98.5 F (36.9 C) (Oral)  Resp 20  SpO2 100%  Physical Exam  Nursing note and vitals reviewed. Constitutional: She is oriented to person, place, and time. She appears well-developed and well-nourished.  HENT:  Head: Normocephalic and atraumatic.  Mouth/Throat: Oropharynx is clear and moist. No oropharyngeal exudate.  Eyes: Conjunctivae and EOM are normal. Pupils are equal, round, and reactive to light.  Neck: Normal range of motion.  Cardiovascular: Normal rate, regular rhythm and normal heart sounds.   Pulmonary/Chest: Effort normal and breath sounds  normal. She has no wheezes. She has no rales.  Abdominal: Soft. Bowel sounds are normal. There is no tenderness.  Musculoskeletal: Normal range of motion. She exhibits no edema and no tenderness.  Neurological: She is alert and oriented to person, place, and time.  Skin: Skin is warm and dry.  Psychiatric: She has a normal mood and affect.    ED Course  Procedures   DIAGNOSTIC STUDIES: Oxygen Saturation is 100% on room air, normal by my interpretation.    COORDINATION OF CARE:  6:15 PM Discussed course of care with pt which includes chest xray and steroids.  Pt understands and agrees.  Labs Reviewed - No data to display No results found.   1. Bronchitis with bronchospasm   2. Seasonal allergies       MDM  I personally performed the services described in this documentation, which was scribed in my presence. The recorded information has been reviewed and is accurate.         Loren Racer,  MD 06/11/12 1650

## 2012-10-27 ENCOUNTER — Other Ambulatory Visit (HOSPITAL_COMMUNITY): Payer: Self-pay | Admitting: Physician Assistant

## 2012-10-27 DIAGNOSIS — Z139 Encounter for screening, unspecified: Secondary | ICD-10-CM

## 2012-11-15 ENCOUNTER — Ambulatory Visit (HOSPITAL_COMMUNITY): Payer: Self-pay

## 2012-11-21 ENCOUNTER — Encounter (HOSPITAL_COMMUNITY): Payer: Self-pay | Admitting: Emergency Medicine

## 2012-11-21 ENCOUNTER — Emergency Department (HOSPITAL_COMMUNITY)
Admission: EM | Admit: 2012-11-21 | Discharge: 2012-11-21 | Disposition: A | Discharge status: Home / Self Care | Payer: Self-pay | Attending: Emergency Medicine | Admitting: Emergency Medicine

## 2012-11-21 ENCOUNTER — Emergency Department (HOSPITAL_COMMUNITY): Payer: Self-pay

## 2012-11-21 DIAGNOSIS — J45901 Unspecified asthma with (acute) exacerbation: Secondary | ICD-10-CM | POA: Insufficient documentation

## 2012-11-21 DIAGNOSIS — Z79899 Other long term (current) drug therapy: Secondary | ICD-10-CM | POA: Insufficient documentation

## 2012-11-21 DIAGNOSIS — J069 Acute upper respiratory infection, unspecified: Secondary | ICD-10-CM | POA: Insufficient documentation

## 2012-11-21 DIAGNOSIS — E7800 Pure hypercholesterolemia, unspecified: Secondary | ICD-10-CM | POA: Insufficient documentation

## 2012-11-21 DIAGNOSIS — I10 Essential (primary) hypertension: Secondary | ICD-10-CM | POA: Insufficient documentation

## 2012-11-21 DIAGNOSIS — IMO0002 Reserved for concepts with insufficient information to code with codable children: Secondary | ICD-10-CM | POA: Insufficient documentation

## 2012-11-21 MED ORDER — IPRATROPIUM BROMIDE 0.02 % IN SOLN
0.5000 mg | Freq: Once | RESPIRATORY_TRACT | Status: AC
  Administered 2012-11-21: 0.5 mg via RESPIRATORY_TRACT
  Filled 2012-11-21: qty 2.5

## 2012-11-21 MED ORDER — PREDNISONE 20 MG PO TABS: 40.0000 mg | ORAL_TABLET | Freq: Every day | ORAL | Status: DC

## 2012-11-21 MED ORDER — ALBUTEROL SULFATE (5 MG/ML) 0.5% IN NEBU
5.0000 mg | INHALATION_SOLUTION | Freq: Once | RESPIRATORY_TRACT | Status: AC
  Administered 2012-11-21: 5 mg via RESPIRATORY_TRACT
  Filled 2012-11-21: qty 1

## 2012-11-21 MED ORDER — PREDNISONE 50 MG PO TABS
60.0000 mg | ORAL_TABLET | Freq: Once | ORAL | Status: AC
  Administered 2012-11-21: 60 mg via ORAL
  Filled 2012-11-21 (×2): qty 1

## 2012-11-21 MED ORDER — BENZONATATE 100 MG PO CAPS: 100.0000 mg | ORAL_CAPSULE | Freq: Three times a day (TID) | ORAL | Status: DC | PRN

## 2012-11-21 MED ORDER — ALBUTEROL SULFATE (2.5 MG/3ML) 0.083% IN NEBU: 2.5000 mg | INHALATION_SOLUTION | RESPIRATORY_TRACT | Status: DC | PRN

## 2012-11-21 NOTE — ED Provider Notes (Signed)
CSN: [id number]     Arrival date & time 11/21/12  1543 History   First MD Initiated Contact with Patient 11/21/12 1553     Chief Complaint  Patient presents with  . Shortness of Breath    HPI Pt was seen at 1610.   Per pt, c/o gradual onset and worsening of persistent cough, wheezing and SOB for the past 2 days.  Describes her symptoms as "my asthma is acting up."  Has been using home MDI with transient relief. States she has run out of her neb solution. Has been associated with runny/stuffy nose and sinus congestion. Denies CP/palpitations, no back pain, no abd pain, no N/V/D, no fevers, no rash.    Past Medical History  Diagnosis Date  . Asthma   . Hypertension   . Sleep apnea   . High cholesterol    Past Surgical History  Procedure Laterality Date  . Abdominal hysterectomy      History  Substance Use Topics  . Smoking status: Never Smoker   . Smokeless tobacco: Not on file  . Alcohol Use: No    Review of Systems ROS: Statement: All systems negative except as marked or noted in the HPI; Constitutional: Negative for fever and chills. ; ; Eyes: Negative for eye pain, redness and discharge. ; ; ENMT: Negative for ear pain, hoarseness, sore throat. +nasal congestion, rhinorrhea, sinus pressure. ; ; Cardiovascular: Negative for chest pain, palpitations, diaphoresis and peripheral edema. ; ; Respiratory: +cough, wheezing, SOB. Negative for stridor. ; ; Gastrointestinal: Negative for nausea, vomiting, diarrhea, abdominal pain, blood in stool, hematemesis, jaundice and rectal bleeding. . ; ; Genitourinary: Negative for dysuria, flank pain and hematuria. ; ; Musculoskeletal: Negative for back pain and neck pain. Negative for swelling and trauma.; ; Skin: Negative for pruritus, rash, abrasions, blisters, bruising and skin lesion.; ; Neuro: Negative for headache, lightheadedness and neck stiffness. Negative for weakness, altered level of consciousness , altered mental status, extremity  weakness, paresthesias, involuntary movement, seizure and syncope.       Allergies  Review of patient's allergies indicates no known allergies.  Home Medications   Current Outpatient Rx  Name  Route  Sig  Dispense  Refill  . albuterol (PROVENTIL) (2.5 MG/3ML) 0.083% nebulizer solution   Nebulization   Take 3 mLs (2.5 mg total) by nebulization every 6 (six) hours as needed.   75 mL   0   . benzonatate (TESSALON) 100 MG capsule   Oral   Take 1 capsule (100 mg total) by mouth every 8 (eight) hours.   21 capsule   0   . loratadine (CLARITIN) 10 MG tablet   Oral   Take 1 tablet (10 mg total) by mouth daily. One po daily x 5 days   5 tablet   0   . montelukast (SINGULAIR) 10 MG tablet   Oral   Take 10 mg by mouth at bedtime.           Marland Kitchen EXPIRED: montelukast (SINGULAIR) 10 MG tablet   Oral   Take 1 tablet (10 mg total) by mouth at bedtime.   30 tablet   0   . predniSONE (DELTASONE) 10 MG tablet   Oral   Take 2 tablets (20 mg total) by mouth daily.   10 tablet   0    BP 143/83  Pulse 109  Temp(Src) 99.7 F (37.6 C) (Oral)  Resp 18  SpO2 98% Physical Exam 1615: Physical examination:  Nursing notes reviewed; Vital signs  and O2 SAT reviewed;  Constitutional: Well developed, Well nourished, Well hydrated, In no acute distress; Head:  Normocephalic, atraumatic; Eyes: EOMI, PERRL, No scleral icterus; ENMT: TM's clear bilat. +edemetous nasal turbinates bilat with clear rhinorrhea. Mouth and pharynx normal, Mucous membranes moist; Neck: Supple, Full range of motion, No lymphadenopathy; Cardiovascular: Regular rate and rhythm, No gallop; Respiratory: Breath sounds diminished & equal bilaterally, faint scattered wheezes. +non-productive cough during exam. Speaking full sentences with ease, Normal respiratory effort/excursion; Chest: Nontender, Movement normal; Abdomen: Soft, Nontender, Nondistended, Normal bowel sounds; Genitourinary: No CVA tenderness; Extremities: Pulses  normal, No tenderness, No edema, No calf edema or asymmetry.; Neuro: AA&Ox3, Major CN grossly intact.  Speech clear. No gross focal motor or sensory deficits in extremities.; Skin: Color normal, Warm, Dry.   ED Course  Procedures     EKG Interpretation   None       MDM  MDM Reviewed: previous chart, nursing note and vitals Interpretation: x-ray     Dg Chest 2 View 11/21/2012   CLINICAL DATA:  Short of breath  EXAM: CHEST  2 VIEW  COMPARISON:  06/01/2012  FINDINGS: Normal heart size. Clear lungs. Left hemidiaphragm remains elevated. No pleural effusion. No pneumothorax.  IMPRESSION: No active cardiopulmonary disease.   Electronically Signed   By: Maryclare Bean M.D.   On: 11/21/2012 17:29     1745:  Pt states she "feels better" after neb and steroid.  NAD, lungs CTA bilat, no wheezing, resps easy, speaking full sentences, Sats 99% R/A. Has been ambulatory with steady gait, easy resps. Wants to go home now. Dx and testing d/w pt and family.  Questions answered.  Verb understanding, agreeable to d/c home with outpt f/u.  Marland Kitchen    Laray Anger, DO 11/23/12 1130

## 2012-11-21 NOTE — ED Notes (Signed)
Patient: -DC'd in no apparent distress with RR and effort WDL -ambulatory out of the ED with stable gait noted and companion at side -reported understanding DC instructions -stable vitals noted at DC -DC'd with 3 prescriptions

## 2012-11-21 NOTE — ED Notes (Addendum)
Pt reports has asthma.  C/O SOB, cough, and runny nose since yesterday.  Denies fever. Pt says is out of her albuterol nebulizer.

## 2012-11-21 NOTE — ED Notes (Signed)
Respiratory at bedside.

## 2012-11-21 NOTE — ED Notes (Signed)
Respiratory called and informed the patient is ready for breathing TX.

## 2012-11-29 ENCOUNTER — Ambulatory Visit (HOSPITAL_COMMUNITY): Payer: Self-pay

## 2013-01-03 ENCOUNTER — Ambulatory Visit (HOSPITAL_COMMUNITY)
Admission: RE | Admit: 2013-01-03 | Discharge: 2013-01-03 | Disposition: A | Discharge status: Home / Self Care | Payer: Self-pay | Source: Ambulatory Visit | Attending: Physician Assistant | Admitting: Physician Assistant

## 2013-01-03 DIAGNOSIS — Z139 Encounter for screening, unspecified: Secondary | ICD-10-CM | POA: Insufficient documentation

## 2013-03-26 ENCOUNTER — Emergency Department (HOSPITAL_COMMUNITY): Payer: Self-pay

## 2013-03-26 ENCOUNTER — Encounter (HOSPITAL_COMMUNITY): Payer: Self-pay | Admitting: Emergency Medicine

## 2013-03-26 ENCOUNTER — Emergency Department (HOSPITAL_COMMUNITY)
Admission: EM | Admit: 2013-03-26 | Discharge: 2013-03-26 | Disposition: A | Discharge status: Home / Self Care | Payer: Self-pay | Attending: Emergency Medicine | Admitting: Emergency Medicine

## 2013-03-26 DIAGNOSIS — J45909 Unspecified asthma, uncomplicated: Secondary | ICD-10-CM | POA: Insufficient documentation

## 2013-03-26 DIAGNOSIS — E7800 Pure hypercholesterolemia, unspecified: Secondary | ICD-10-CM | POA: Insufficient documentation

## 2013-03-26 DIAGNOSIS — I10 Essential (primary) hypertension: Secondary | ICD-10-CM | POA: Insufficient documentation

## 2013-03-26 DIAGNOSIS — J069 Acute upper respiratory infection, unspecified: Secondary | ICD-10-CM | POA: Insufficient documentation

## 2013-03-26 DIAGNOSIS — Z79899 Other long term (current) drug therapy: Secondary | ICD-10-CM | POA: Insufficient documentation

## 2013-03-26 MED ORDER — ALBUTEROL SULFATE (2.5 MG/3ML) 0.083% IN NEBU
INHALATION_SOLUTION | RESPIRATORY_TRACT | Status: AC
  Administered 2013-03-26: 2.5 mg via RESPIRATORY_TRACT
  Filled 2013-03-26: qty 3

## 2013-03-26 MED ORDER — ALBUTEROL SULFATE (2.5 MG/3ML) 0.083% IN NEBU
2.5000 mg | INHALATION_SOLUTION | Freq: Once | RESPIRATORY_TRACT | Status: AC
  Administered 2013-03-26 (×2): 2.5 mg via RESPIRATORY_TRACT

## 2013-03-26 MED ORDER — BENZONATATE 100 MG PO CAPS: 100.0000 mg | ORAL_CAPSULE | Freq: Three times a day (TID) | ORAL | Status: DC | PRN

## 2013-03-26 MED ORDER — IPRATROPIUM-ALBUTEROL 0.5-2.5 (3) MG/3ML IN SOLN
RESPIRATORY_TRACT | Status: AC
  Administered 2013-03-26: 3 mL via RESPIRATORY_TRACT
  Filled 2013-03-26: qty 3

## 2013-03-26 MED ORDER — IPRATROPIUM-ALBUTEROL 0.5-2.5 (3) MG/3ML IN SOLN
3.0000 mL | Freq: Once | RESPIRATORY_TRACT | Status: AC
  Administered 2013-03-26 (×2): 3 mL via RESPIRATORY_TRACT

## 2013-03-26 NOTE — ED Notes (Signed)
Pt c/o asthma flaring up. States that she has had a cold and noticed yesterday she was getting worse, admits to cough that is productive with yellow sputum, runny nose, congestion, has been using her inhaler with no relief in symptoms.

## 2013-03-26 NOTE — ED Notes (Signed)
Pt alert & oriented x4, stable gait. Patient given discharge instructions, paperwork & prescription(s). Patient  instructed to stop at the registration desk to finish any additional paperwork. Patient verbalized understanding. Pt left department w/ no further questions. 

## 2013-03-26 NOTE — ED Provider Notes (Signed)
CSN: [id number]     Arrival date & time 03/26/13  1558 History   First MD Initiated Contact with Patient 03/26/13 1609     Chief Complaint  Patient presents with  . Asthma     HPI Pt was seen at 1610.  Per pt, c/o gradual onset and persistence of constant runny/stuffy nose, sinus congestion, and cough for the past 2 to 3 days. Pt has been using her albuterol MDI without improvement in her cough. Denies wheezing, no SOB, no fevers, no rash, no CP/palpitations, no N/V/D, no abd pain.     Past Medical History  Diagnosis Date  . Asthma   . Hypertension   . Sleep apnea   . High cholesterol    Past Surgical History  Procedure Laterality Date  . Abdominal hysterectomy      History  Substance Use Topics  . Smoking status: Never Smoker   . Smokeless tobacco: Not on file  . Alcohol Use: No    Review of Systems ROS: Statement: All systems negative except as marked or noted in the HPI; Constitutional: Negative for fever and chills. ; ; Eyes: Negative for eye pain, redness and discharge. ; ; ENMT: Negative for ear pain, hoarseness, sore throat. +nasal congestion, sinus pressure. ; ; Cardiovascular: Negative for chest pain, palpitations, diaphoresis, dyspnea and peripheral edema. ; ; Respiratory: +cough. Negative for wheezing and stridor. ; ; Gastrointestinal: Negative for nausea, vomiting, diarrhea, abdominal pain, blood in stool, hematemesis, jaundice and rectal bleeding. . ; ; Genitourinary: Negative for dysuria, flank pain and hematuria. ; ; Musculoskeletal: Negative for back pain and neck pain. Negative for swelling and trauma.; ; Skin: Negative for pruritus, rash, abrasions, blisters, bruising and skin lesion.; ; Neuro: Negative for headache, lightheadedness and neck stiffness. Negative for weakness, altered level of consciousness , altered mental status, extremity weakness, paresthesias, involuntary movement, seizure and syncope.      Allergies  Review of patient's allergies indicates  no known allergies.  Home Medications   Current Outpatient Rx  Name  Route  Sig  Dispense  Refill  . albuterol (PROVENTIL HFA;VENTOLIN HFA) 108 (90 BASE) MCG/ACT inhaler   Inhalation   Inhale 2 puffs into the lungs every 6 (six) hours as needed for wheezing.         Marland Kitchen albuterol (PROVENTIL) (2.5 MG/3ML) 0.083% nebulizer solution   Nebulization   Take 3 mLs (2.5 mg total) by nebulization every 6 (six) hours as needed.   75 mL   0   . albuterol (PROVENTIL) (2.5 MG/3ML) 0.083% nebulizer solution   Nebulization   Take 3 mLs (2.5 mg total) by nebulization every 4 (four) hours as needed for wheezing.   75 mL   0   . benzonatate (TESSALON) 100 MG capsule   Oral   Take 1 capsule (100 mg total) by mouth 3 (three) times daily as needed for cough.   15 capsule   0   . lisinopril-hydrochlorothiazide (PRINZIDE,ZESTORETIC) 20-12.5 MG per tablet   Oral   Take 1 tablet by mouth every morning.         Marland Kitchen EXPIRED: montelukast (SINGULAIR) 10 MG tablet   Oral   Take 1 tablet (10 mg total) by mouth at bedtime.   30 tablet   0   . pravastatin (PRAVACHOL) 20 MG tablet   Oral   Take 20 mg by mouth at bedtime.         . predniSONE (DELTASONE) 20 MG tablet   Oral   Take  2 tablets (40 mg total) by mouth daily.   10 tablet   0    BP 181/98  Pulse 90  Temp(Src) 98.3 F (36.8 C) (Oral)  Resp 20  SpO2 100% Physical Exam 1615: Physical examination:  Nursing notes reviewed; Vital signs and O2 SAT reviewed;  Constitutional: Well developed, Well nourished, Well hydrated, In no acute distress. Talking on her cellphone on my arrival to ED exam room.; Head:  Normocephalic, atraumatic; Eyes: EOMI, PERRL, No scleral icterus; ENMT: TM's clear bilat. +edemetous nasal turbinates bilat with clear rhinorrhea. Mouth and pharynx normal, Mucous membranes moist; Neck: Supple, Full range of motion, No lymphadenopathy; Cardiovascular: Regular rate and rhythm, No murmur, rub, or gallop; Respiratory: Breath  sounds clear & equal bilaterally, No wheezes.  Speaking full sentences with ease, Normal respiratory effort/excursion; Chest: Nontender, Movement normal; Abdomen: Soft, Nontender, Nondistended, Normal bowel sounds; Genitourinary: No CVA tenderness; Extremities: Pulses normal, No tenderness, No edema, No calf edema or asymmetry.; Neuro: AA&Ox3, Major CN grossly intact.  Speech clear. No gross focal motor or sensory deficits in extremities. Climbs on and off stretcher easily by herself. Gait steady.; Skin: Color normal, Warm, Dry.   ED Course  Procedures     EKG Interpretation   None       MDM  MDM Reviewed: previous chart, nursing note and vitals Interpretation: x-ray   Dg Chest 2 View 03/26/2013   CLINICAL DATA:  Cough.  Shortness of breath.  Asthma.  EXAM: CHEST  2 VIEW  COMPARISON:  11/21/12  FINDINGS: Mild elevation of left hemidiaphragm is stable. Heart size is normal. Both lungs are clear. No evidence of pleural effusion. No mass or lymphadenopathy identified.  IMPRESSION: Stable exam.  No active disease.   Electronically Signed   By: Earle Gell M.D.   On: 03/26/2013 17:20    1725:  Lungs CTA bilat, no wheezing, Sats 100% R/A: but pt requesting neb treatment. Neb given without change in assessment. Pt states she feels "better" and wants to go home now. No infiltrate on CXR. Will tx symptomatically for URI at this time. Dx and testing d/w pt.  Questions answered.  Verb understanding, agreeable to d/c home with outpt f/u.   Alfonzo Feller, DO 03/29/13 1259

## 2013-03-26 NOTE — Discharge Instructions (Signed)
°Emergency Department Resource Guide °1) Find a Doctor and Pay Out of Pocket °Although you won't have to find out who is covered by your insurance plan, it is a good idea to ask around and get recommendations. You will then need to call the office and see if the doctor you have chosen will accept you as a new patient and what types of options they offer for patients who are self-pay. Some doctors offer discounts or will set up payment plans for their patients who do not have insurance, but you will need to ask so you aren't surprised when you get to your appointment. ° °2) Contact Your Local Health Department °Not all health departments have doctors that can see patients for sick visits, but many do, so it is worth a call to see if yours does. If you don't know where your local health department is, you can check in your phone book. The CDC also has a tool to help you locate your state's health department, and many state websites also have listings of all of their local health departments. ° °3) Find a Walk-in Clinic °If your illness is not likely to be very severe or complicated, you may want to try a walk in clinic. These are popping up all over the country in pharmacies, drugstores, and shopping centers. They're usually staffed by nurse practitioners or physician assistants that have been trained to treat common illnesses and complaints. They're usually fairly quick and inexpensive. However, if you have serious medical issues or chronic medical problems, these are probably not your best option. ° °No Primary Care Doctor: °- Call [facility] at  832-8000 - they can help you locate a primary care doctor that  accepts your insurance, provides certain services, etc. °- Physician Referral Service- 1-800-533-3463 ° °Chronic Pain Problems: °Organization         Address  Phone   Notes  °[facility]  (336) [phone] Patients need to be referred by their primary care doctor.  ° °Medication  Assistance: °Organization         Address  Phone   Notes  °[place] [facility] [address] °[place], [place] [address] (336) 641-8030 --Must be a resident of [place] °-- Must have NO insurance coverage whatsoever (no Medicaid/ Medicare, etc.) °-- The pt. MUST have a primary care doctor that directs their care regularly and follows them in the community °  °[facility]  (866) 331-1348   °[employer]  (888) 892-1162   ° °Agencies that provide inexpensive medical care: °Organization         Address  Phone   Notes  °[facility]  (336) 832-8035   °[facility]    (336) 832-7272   °[facility] [address] °[place], [place] [address] (336) 832-4777   °[facility] [address], °[place] (336) 271-4999   °[facility]    (336) 373-0678   °[facility]    (336) 272-1050   °[facility] ° [address], [place] Phone:  (336) 832-4444, Fax:  (336) 832-4440 Hours of Operation:  9 am - 6 pm, M-F.  Also accepts Medicaid/Medicare and self-pay.  °[facility] ° [address], [place] Phone: (336) 832-3150, Fax: (336) [phone]. Hours of Operation:  8:30 am - 5:30 pm, M-F.  Also accepts Medicaid and self-pay.  °[facility] [place] [address], [place] Phone: (336) 878-6027   °[facility] [address], [place], [place] (336)723-1848, Ext. 123 Mondays & Thursdays: 7-9 AM.  First 15 patients are seen on a first come, first serve basis. °  ° °Medicaid-accepting [place] Providers: ° °Organization         Address  Phone   Notes  °[facility] [address], [place] (336) 641-2100 Also accepts self-pay patients.  °[facility] [address], [place] ° (336) 856-9996   °[facility] [address], [place]  (336) 288-8857   °[facility] [address] [place] [address], [place] (336) 299-7000   °Veita Bland [address], [place]  ° (336) 373-1557 Only accepts [place] Access Medicaid patients after they have their name applied to their card.  ° °Self-Pay (no insurance) in [place]: ° °Organization         Address  Phone   Notes  °Sickle Cell Patients, [facility] [address], [place] (336) 832-1970   °[facility] [address], [place] (336) 832-4400   °[facility] ° [address], [place] (336) 992-4800   °[facility]/Dr. Osei-Bonsu ° [address] [place] [address], [place] or [address], [place] (336) [phone] Phone number for both [place] and [place] locations is the same.  °[facility] [address], [place] (336) 299-0000   °[facility] [address] [place] [address], [place] or [address] (336) 852-7530 °(336) 878-2260   °[facility] [address], [place] (336) 350-1642, phone; (336) 294-5005, fax Sees patients 1st and 3rd Saturday of every month.  Must not qualify for public or private insurance (i.e. Medicaid, Medicare, [place] Health Choice, Veterans' Benefits) • Household income should be no more than 200% of the poverty level •The clinic cannot treat you if you are pregnant or think you are pregnant • Sexually transmitted diseases are not treated at the clinic.  ° ° °Dental Care: °Organization         Address  Phone  Notes  °[place] [facility] [address], [place] (336) 641-6152 Accepts children up to age 21 who are enrolled in Medicaid or [place] Health Choice; pregnant women with a Medicaid card; and children who have applied for Medicaid or [place] Health Choice, but were declined, whose parents can pay a reduced fee at time of service.  °[place]  [facility] [place]  [address], [place] (336) 641-7733 Accepts children up to age 21 who are enrolled in Medicaid or [place] Health Choice; pregnant women with a Medicaid card; and children who have applied for Medicaid or [place] Health Choice, but were declined, whose parents can pay a reduced fee at time of service.  °[facility] ° [address], [place] (336) 641-4533 Patients are seen by appointment only. Walk-ins are not accepted. [facility] will see patients 18 years of age and older. °Monday - Tuesday (8am-5pm) °Most Wednesdays (8:30-5pm) °$30 per visit, cash only  °[facility] ° [address], [place] (336) 641-4533 Patients are seen by appointment only. Walk-ins are not accepted. [facility] will see patients 18 years of age and older. °One   Wednesday Evening (Monthly: Volunteer Based).  $30 per visit, cash only  °[facility]  (919) 537-3737 for adults; Children under age 4, call Graduate Pediatric Dentistry at ([phone]) [phone]. Children aged 4-14, please call (919) [phone] to request a pediatric application. ° Dental services are provided in all areas of dental care including fillings, crowns and bridges, complete and partial dentures, implants, gum treatment, root canals, and extractions. Preventive care is also provided. Treatment is provided to both adults and children. °Patients are selected via a lottery and there is often a waiting list. °  °[facility] [address], °[place] ° (336) 763-8833 [email or web address] °  °[facility] [address], [place], [place] (336)723-1848, Ext. 123 Second and Fourth Thursday of each month, opens at 6:30 AM; Clinic ends at 9 AM.  Patients are seen on a first-come first-served basis, and a limited number are seen during each clinic.  ° °[facility] ° [address], [place], [place] (336) 723-7904    Eligibility Requirements °You must have lived in [place], [place], or [place] counties for at least the last three months. °  You cannot be eligible for state or federal sponsored healthcare insurance, including Veterans Administration, Medicaid, or Medicare. °  You generally cannot be eligible for healthcare insurance through your employer.  °  How to apply: °Eligibility screenings are held every Tuesday and Wednesday afternoon from 1:00 pm until 4:00 pm. You do not need an appointment for the interview!  °[facility] [address], [place], [place] 336-631-2330   °[facility]  336-342-8273   °[facility]  336-703-3100   °[facility]  336-570-6415   ° °Behavioral Health Resources in the Community: °Intensive Outpatient Programs °Organization         Address  Phone  Notes  °[facility] [address], [place], [place] 336-878-6098   °[facility] [address], [place], [place] 336-832-9800   °[facility] [address], [place], [place] ° 336-882-2125   °[facility] [address],  °[place], [place] 1-800-853-5163 or 336-641-4981   °Substance Abuse Resources °Organization         Address  Phone  Notes  °[facility]  336-882-2125   °[facility]  336-784-9470   °[facility]  336-285-9073   °[facility]  336-845-3988   °Residential & Outpatient Substance Abuse Program  1-800-659-3381   °Psychological Services °Organization         Address  Phone  Notes  °[facility]  336- 832-9600   °[facility]  336- 378-7881   °[facility] [address], [place] 1-800-853-5163 or 336-641-4981   ° °Mobile Crisis Teams °Organization         Address  Phone  Notes  °[facility], Mobile Crisis Care Unit  1-877-626-1772   °Assertive °[facility] ° [address]  [place], [place] 336-834-9664   °Sharon DeEsch [address] °[place] 336-554-5454   ° °Self-Help/Support Groups °Organization         Address  Phone             Notes  °[facility] - variety of support groups  336- 373-1402 Call for more information  °Narcotics Anonymous (NA), [facility] [address], °[place]  2 meetings at this location  ° °  Residential Treatment Programs Organization         Address  Phone  Notes  [facility] [address]    [place]  1-601-360-3181   [facility]  [address] [place] [address], [place], [place]   [facility] [place], [place] 620-036-1271 Admissions: 8am-3pm M-F  [facility] [place] [address],    [place], [place] 174-081-4481   [facility] [address] [place], [place], [place]   [facility] [address],  [place], [place]   [facility] Intensive Outpatient [address], [place], [place]   [facility] ([place] [place]  [place], [place] 1-607-574-6339 or 667-061-1904   [facility] [address] [place], [place] Accepts Medicaid  [facility] [address]  [place] 1-870-457-2570 Substance Abuse/Addiction Treatment   [facility]         Address  Phone  Notes  [facility]  (343)004-3998   Domenic Schwab, PhD [address] Arlis Porta [place], [place]   331-735-2445 or (516) 228-2101   [facility] [place], [place] 820-694-8168   [facility] [address] [place], [place] (804)756-0682 Insurance/Medicaid/sponsorship through [facility] [address]                                    [place], [place] (705)701-8567 [place] [address], [place] (705)[phone]    Dr. Adele Schilder  470-774-7384   [facility] Dept. 1) [address] [place] 2) [place] 3)  [address], [place] (408)[phone] ([phone])191-2960  2170631796   [place] 9171202655 or ([phone] (After Hours)       Take the prescription as directed.  Use your albuterol inhaler (2 to 4 puffs) or your albuterol nebulizer (1 unit dose) every 4 hours for the next 7 days, then as needed for cough, wheezing, or shortness of breath. Take over the counter decongestant (such as sudafed), as directed on packaging, for the next week.  Use over the counter normal saline nasal spray, as instructed in the Emergency Department, several times per day for the next 2 weeks.  Call your regular medical doctor on Monday to schedule a follow up appointment in the next 2 to 3 days.  Return to the Emergency Department immediately if worsening.

## 2013-10-16 ENCOUNTER — Emergency Department (HOSPITAL_COMMUNITY): Payer: Self-pay

## 2013-10-16 ENCOUNTER — Encounter (HOSPITAL_COMMUNITY): Payer: Self-pay | Admitting: Emergency Medicine

## 2013-10-16 ENCOUNTER — Emergency Department (HOSPITAL_COMMUNITY)
Admission: EM | Admit: 2013-10-16 | Discharge: 2013-10-16 | Disposition: A | Discharge status: Home / Self Care | Payer: Self-pay | Attending: Emergency Medicine | Admitting: Emergency Medicine

## 2013-10-16 DIAGNOSIS — J45901 Unspecified asthma with (acute) exacerbation: Secondary | ICD-10-CM | POA: Insufficient documentation

## 2013-10-16 DIAGNOSIS — J40 Bronchitis, not specified as acute or chronic: Secondary | ICD-10-CM

## 2013-10-16 DIAGNOSIS — I10 Essential (primary) hypertension: Secondary | ICD-10-CM | POA: Insufficient documentation

## 2013-10-16 DIAGNOSIS — Z8639 Personal history of other endocrine, nutritional and metabolic disease: Secondary | ICD-10-CM | POA: Insufficient documentation

## 2013-10-16 DIAGNOSIS — J45909 Unspecified asthma, uncomplicated: Secondary | ICD-10-CM | POA: Insufficient documentation

## 2013-10-16 DIAGNOSIS — Z862 Personal history of diseases of the blood and blood-forming organs and certain disorders involving the immune mechanism: Secondary | ICD-10-CM | POA: Insufficient documentation

## 2013-10-16 MED ORDER — AMOXICILLIN 500 MG PO CAPS: 500.0000 mg | ORAL_CAPSULE | Freq: Three times a day (TID) | ORAL | Status: DC

## 2013-10-16 MED ORDER — HYDROCODONE-HOMATROPINE 5-1.5 MG/5ML PO SYRP: 5.0000 mL | ORAL_SOLUTION | Freq: Four times a day (QID) | ORAL | Status: DC | PRN

## 2013-10-16 MED ORDER — PREDNISONE 20 MG PO TABS: 60.0000 mg | ORAL_TABLET | Freq: Every day | ORAL | Status: DC

## 2013-10-16 NOTE — ED Notes (Signed)
Pt with asthma since Friday with productive cough of yellow phlegm, last breathing treatment this morning (inhaler and neb) at home

## 2013-10-16 NOTE — Discharge Instructions (Signed)
Asthma Asthma is a recurring condition in which the airways tighten and narrow. Asthma can make it difficult to breathe. It can cause coughing, wheezing, and shortness of breath. Asthma episodes, also called asthma attacks, range from minor to life-threatening. Asthma cannot be cured, but medicines and lifestyle changes can help control it. CAUSES Asthma is believed to be caused by inherited (genetic) and environmental factors, but its exact cause is unknown. Asthma may be triggered by allergens, lung infections, or irritants in the air. Asthma triggers are different for each person. Common triggers include:   Animal dander.  Dust mites.  Cockroaches.  Pollen from trees or grass.  Mold.  Smoke.  Air pollutants such as dust, household cleaners, hair sprays, aerosol sprays, paint fumes, strong chemicals, or strong odors.  Cold air, weather changes, and winds (which increase molds and pollens in the air).  Strong emotional expressions such as crying or laughing hard.  Stress.  Certain medicines (such as aspirin) or types of drugs (such as beta-blockers).  Sulfites in foods and drinks. Foods and drinks that may contain sulfites include dried fruit, potato chips, and sparkling grape juice.  Infections or inflammatory conditions such as the flu, a cold, or an inflammation of the nasal membranes (rhinitis).  Gastroesophageal reflux disease (GERD).  Exercise or strenuous activity. SYMPTOMS Symptoms may occur immediately after asthma is triggered or many hours later. Symptoms include:  Wheezing.  Excessive nighttime or early morning coughing.  Frequent or severe coughing with a common cold.  Chest tightness.  Shortness of breath. DIAGNOSIS  The diagnosis of asthma is made by a review of your medical history and a physical exam. Tests may also be performed. These may include:  Lung function studies. These tests show how much air you breathe in and out.  Allergy  tests.  Imaging tests such as X-rays. TREATMENT  Asthma cannot be cured, but it can usually be controlled. Treatment involves identifying and avoiding your asthma triggers. It also involves medicines. There are 2 classes of medicine used for asthma treatment:   Controller medicines. These prevent asthma symptoms from occurring. They are usually taken every day.  Reliever or rescue medicines. These quickly relieve asthma symptoms. They are used as needed and provide short-term relief. Your health care provider will help you create an asthma action plan. An asthma action plan is a written plan for managing and treating your asthma attacks. It includes a list of your asthma triggers and how they may be avoided. It also includes information on when medicines should be taken and when their dosage should be changed. An action plan may also involve the use of a device called a peak flow meter. A peak flow meter measures how well the lungs are working. It helps you monitor your condition. HOME CARE INSTRUCTIONS   Take medicines only as directed by your health care provider. Speak with your health care provider if you have questions about how or when to take the medicines.  Use a peak flow meter as directed by your health care provider. Record and keep track of readings.  Understand and use the action plan to help minimize or stop an asthma attack without needing to seek medical care.  Control your home environment in the following ways to help prevent asthma attacks:  Do not smoke. Avoid being exposed to secondhand smoke.  Change your heating and air conditioning filter regularly.  Limit your use of fireplaces and wood stoves.  Get rid of pests (such as roaches and  mice) and their droppings. °¨ Throw away plants if you see mold on them. °¨ Clean your floors and dust regularly. Use unscented cleaning products. °¨ Try to have someone else vacuum for you regularly. Stay out of rooms while they are  being vacuumed and for a short while afterward. If you vacuum, use a dust mask from a hardware store, a double-layered or microfilter vacuum cleaner bag, or a vacuum cleaner with a HEPA filter. °¨ Replace carpet with wood, tile, or vinyl flooring. Carpet can trap dander and dust. °¨ Use allergy-proof pillows, mattress covers, and box spring covers. °¨ Wash bed sheets and blankets every week in hot water and dry them in a dryer. °¨ Use blankets that are made of polyester or cotton. °¨ Clean bathrooms and kitchens with bleach. If possible, have someone repaint the walls in these rooms with mold-resistant paint. Keep out of the rooms that are being cleaned and painted. °¨ Wash hands frequently. °SEEK MEDICAL CARE IF:  °· You have wheezing, shortness of breath, or a cough even if taking medicine to prevent attacks. °· The colored mucus you cough up (sputum) is thicker than usual. °· Your sputum changes from clear or white to yellow, green, gray, or bloody. °· You have any problems that may be related to the medicines you are taking (such as a rash, itching, swelling, or trouble breathing). °· You are using a reliever medicine more than 2-3 times per week. °· Your peak flow is still at 50-79% of your personal best after following your action plan for 1 hour. °· You have a fever. °SEEK IMMEDIATE MEDICAL CARE IF:  °· You seem to be getting worse and are unresponsive to treatment during an asthma attack. °· You are short of breath even at rest. °· You get short of breath when doing very little physical activity. °· You have difficulty eating, drinking, or talking due to asthma symptoms. °· You develop chest pain. °· You develop a fast heartbeat. °· You have a bluish color to your lips or fingernails. °· You are light-headed, dizzy, or faint. °· Your peak flow is less than 50% of your personal best. °MAKE SURE YOU:  °· Understand these instructions. °· Will watch your condition. °· Will get help right away if you are not  doing well or get worse. °Document Released: 01/20/2005 Document Revised: 06/06/2013 Document Reviewed: 08/19/2012 °Exit[employer]® Patient Information ©2015 [employer]. This information is not intended to replace advice given to you by your health care provider. Make sure you discuss any questions you have with your health care provider. °Acute Bronchitis °Bronchitis is inflammation of the airways that extend from the windpipe into the lungs (bronchi). The inflammation often causes mucus to develop. This leads to a cough, which is the most common symptom of bronchitis.  °In acute bronchitis, the condition usually develops suddenly and goes away over time, usually in a couple weeks. Smoking, allergies, and asthma can make bronchitis worse. Repeated episodes of bronchitis may cause further lung problems.  °CAUSES °Acute bronchitis is most often caused by the same virus that causes a cold. The virus can spread from person to person (contagious) through coughing, sneezing, and touching contaminated objects. °SIGNS AND SYMPTOMS  °· Cough.   °· Fever.   °· Coughing up mucus.   °· Body aches.   °· Chest congestion.   °· Chills.   °· Shortness of breath.   °· Sore throat.   °DIAGNOSIS  °Acute bronchitis is usually diagnosed through a physical exam. Your health care provider will also   ask you questions about your medical history. Tests, such as chest X-rays, are sometimes done to rule out other conditions.  °TREATMENT  °Acute bronchitis usually goes away in a couple weeks. Oftentimes, no medical treatment is necessary. Medicines are sometimes given for relief of fever or cough. Antibiotic medicines are usually not needed but may be prescribed in certain situations. In some cases, an inhaler may be recommended to help reduce shortness of breath and control the cough. A cool mist vaporizer may also be used to help thin bronchial secretions and make it easier to clear the chest.  °HOME CARE INSTRUCTIONS °· Get plenty of rest.    °· Drink enough fluids to keep your urine clear or pale yellow (unless you have a medical condition that requires fluid restriction). Increasing fluids may help thin your respiratory secretions (sputum) and reduce chest congestion, and it will prevent dehydration.   °· Take medicines only as directed by your health care provider. °· If you were prescribed an antibiotic medicine, finish it all even if you start to feel better. °· Avoid smoking and secondhand smoke. Exposure to cigarette smoke or irritating chemicals will make bronchitis worse. If you are a smoker, consider using nicotine gum or skin patches to help control withdrawal symptoms. Quitting smoking will help your lungs heal faster.   °· Reduce the chances of another bout of acute bronchitis by washing your hands frequently, avoiding people with cold symptoms, and trying not to touch your hands to your mouth, nose, or eyes.   °· Keep all follow-up visits as directed by your health care provider.   °SEEK MEDICAL CARE IF: °Your symptoms do not improve after 1 week of treatment.  °SEEK IMMEDIATE MEDICAL CARE IF: °· You develop an increased fever or chills.   °· You have chest pain.   °· You have severe shortness of breath. °· You have bloody sputum.   °· You develop dehydration. °· You faint or repeatedly feel like you are going to pass out. °· You develop repeated vomiting. °· You develop a severe headache. °MAKE SURE YOU:  °· Understand these instructions. °· Will watch your condition. °· Will get help right away if you are not doing well or get worse. °Document Released: 02/28/2004 Document Revised: 06/06/2013 Document Reviewed: 07/13/2012 °ExitCare® Patient Information ©2015 [employer]. This information is not intended to replace advice given to you by your health care provider. Make sure you discuss any questions you have with your health care provider. ° °

## 2013-10-16 NOTE — ED Provider Notes (Signed)
CSN: [id number]     Arrival date & time 10/16/13  1211 History   First MD Initiated Contact with Patient 10/16/13 1256     Chief Complaint  Patient presents with  . Asthma     (Consider location/radiation/quality/duration/timing/severity/associated sxs/prior Treatment) HPI Comments: Patient presents with complaints of nasal congestion, cough and increased wheezing for the last 2 days. Cough is productive of yellow sputum. He has been using her albuterol at home with only some relief. There is no chest pain.  Patient is a 63 y.o. female presenting with asthma.  Asthma Associated symptoms include shortness of breath.    Past Medical History  Diagnosis Date  . Asthma   . Hypertension   . Sleep apnea   . High cholesterol    Past Surgical History  Procedure Laterality Date  . Abdominal hysterectomy     History reviewed. No pertinent family history. History  Substance Use Topics  . Smoking status: Never Smoker   . Smokeless tobacco: Not on file  . Alcohol Use: No   OB History   Grav Para Term Preterm Abortions TAB SAB Ect Mult Living                 Review of Systems  HENT: Positive for congestion.   Respiratory: Positive for cough and shortness of breath.   All other systems reviewed and are negative.     Allergies  Review of patient's allergies indicates no known allergies.  Home Medications   Prior to Admission medications   Medication Sig Start Date End Date Taking? Authorizing Provider  albuterol (PROVENTIL HFA;VENTOLIN HFA) 108 (90 BASE) MCG/ACT inhaler Inhale 2 puffs into the lungs every 6 (six) hours as needed for wheezing.   Yes Historical Provider, MD  albuterol (PROVENTIL) (2.5 MG/3ML) 0.083% nebulizer solution Take 2.5 mg by nebulization every 6 (six) hours as needed for shortness of breath. 06/01/12  Yes Julianne Rice, MD  lisinopril-hydrochlorothiazide (PRINZIDE,ZESTORETIC) 20-25 MG per tablet Take 1 tablet by mouth daily.   Yes Historical Provider,  MD  montelukast (SINGULAIR) 10 MG tablet Take 10 mg by mouth daily.   Yes Historical Provider, MD   BP 150/80  Pulse 93  Temp(Src) 99.8 F (37.7 C) (Oral)  Resp 18  Ht 5\' 3"  (1.6 m)  Wt 174 lb 11.2 oz (79.243 kg)  BMI 30.95 kg/m2  SpO2 100% Physical Exam  Constitutional: She is oriented to person, place, and time. She appears well-developed and well-nourished. No distress.  HENT:  Head: Normocephalic and atraumatic.  Right Ear: Hearing normal.  Left Ear: Hearing normal.  Nose: Nose normal.  Mouth/Throat: Oropharynx is clear and moist and mucous membranes are normal.  Eyes: Conjunctivae and EOM are normal. Pupils are equal, round, and reactive to light.  Neck: Normal range of motion. Neck supple.  Cardiovascular: Regular rhythm, S1 normal and S2 normal.  Exam reveals no gallop and no friction rub.   No murmur heard. Pulmonary/Chest: Effort normal and breath sounds normal. No respiratory distress. She exhibits no tenderness.  Abdominal: Soft. Normal appearance and bowel sounds are normal. There is no hepatosplenomegaly. There is no tenderness. There is no rebound, no guarding, no tenderness at McBurney's point and negative Murphy's sign. No hernia.  Musculoskeletal: Normal range of motion.  Neurological: She is alert and oriented to person, place, and time. She has normal strength. No cranial nerve deficit or sensory deficit. Coordination normal. GCS eye subscore is 4. GCS verbal subscore is 5. GCS motor subscore is 6.  Skin: Skin is warm, dry and intact. No rash noted. No cyanosis.  Psychiatric: She has a normal mood and affect. Her speech is normal and behavior is normal. Thought content normal.    ED Course  Procedures (including critical care time) Labs Review Labs Reviewed - No data to display  Imaging Review No results found.   EKG Interpretation None      MDM   Final diagnoses:  None   asthma  Bronchitis  Vision with upper respiratory nasal congestion and  productive cough. Patient does have a history of asthma. She is using her inhaler and nebulizer with partial relief. Currently, however, there is no bronchospasm. Oxygenation is 100%. Patient does have low-grade fever, but x-rays showing no evidence of pneumonia. Date for mucopurulent bronchitis and asthma exacerbation.    Orpah Greek, MD 10/16/13 [phone]

## 2014-01-17 ENCOUNTER — Emergency Department (HOSPITAL_COMMUNITY)
Admission: EM | Admit: 2014-01-17 | Discharge: 2014-01-17 | Disposition: A | Discharge status: Home / Self Care | Payer: Medicare HMO | Attending: Emergency Medicine | Admitting: Emergency Medicine

## 2014-01-17 ENCOUNTER — Encounter (HOSPITAL_COMMUNITY): Payer: Self-pay | Admitting: *Deleted

## 2014-01-17 DIAGNOSIS — J45909 Unspecified asthma, uncomplicated: Secondary | ICD-10-CM | POA: Insufficient documentation

## 2014-01-17 DIAGNOSIS — Z8669 Personal history of other diseases of the nervous system and sense organs: Secondary | ICD-10-CM | POA: Insufficient documentation

## 2014-01-17 DIAGNOSIS — Z8639 Personal history of other endocrine, nutritional and metabolic disease: Secondary | ICD-10-CM | POA: Insufficient documentation

## 2014-01-17 DIAGNOSIS — Z760 Encounter for issue of repeat prescription: Secondary | ICD-10-CM | POA: Diagnosis not present

## 2014-01-17 DIAGNOSIS — Z79899 Other long term (current) drug therapy: Secondary | ICD-10-CM | POA: Diagnosis not present

## 2014-01-17 DIAGNOSIS — Z7952 Long term (current) use of systemic steroids: Secondary | ICD-10-CM | POA: Insufficient documentation

## 2014-01-17 DIAGNOSIS — I10 Essential (primary) hypertension: Secondary | ICD-10-CM | POA: Diagnosis present

## 2014-01-17 MED ORDER — LISINOPRIL-HYDROCHLOROTHIAZIDE 20-25 MG PO TABS
1.0000 | ORAL_TABLET | Freq: Every day | ORAL | Status: DC
Start: 2014-01-17 — End: 2014-03-08

## 2014-01-17 MED ORDER — MONTELUKAST SODIUM 10 MG PO TABS: 10.0000 mg | ORAL_TABLET | Freq: Every day | ORAL | Status: DC

## 2014-01-17 NOTE — ED Notes (Signed)
Pt says she thinks her bp is up .  Took her last bp med yesterday, and is now out of meds

## 2014-01-17 NOTE — ED Provider Notes (Signed)
CSN: [id number]     Arrival date & time 01/17/14  1643 History   First MD Initiated Contact with Patient 01/17/14 1722     Chief Complaint  Patient presents with  . Hypertension     (Consider location/radiation/quality/duration/timing/severity/associated sxs/prior Treatment) Patient is a 63 y.o. female presenting with hypertension. The history is provided by the patient.  Hypertension This is a new problem. The current episode started today.   Sheri Fleming is a 63 y.o. female who presents to the ED for BP check. She ran out of her medication yesterday and is afraid her pressure is up. She denies headache or any symptoms of high BP just missed her medication today. She is in the process of changing doctors and ran out of her medication.   Past Medical History  Diagnosis Date  . Asthma   . Hypertension   . Sleep apnea   . High cholesterol    Past Surgical History  Procedure Laterality Date  . Abdominal hysterectomy    . Fracture surgery     History reviewed. No pertinent family history. History  Substance Use Topics  . Smoking status: Never Smoker   . Smokeless tobacco: Not on file  . Alcohol Use: No   OB History    No data available     Review of Systems Negative except as stated in HPI   Allergies  Review of patient's allergies indicates no known allergies.  Home Medications   Prior to Admission medications   Medication Sig Start Date End Date Taking? Authorizing Provider  albuterol (PROVENTIL HFA;VENTOLIN HFA) 108 (90 BASE) MCG/ACT inhaler Inhale 2 puffs into the lungs every 6 (six) hours as needed for wheezing.    Historical Provider, MD  albuterol (PROVENTIL) (2.5 MG/3ML) 0.083% nebulizer solution Take 2.5 mg by nebulization every 6 (six) hours as needed for shortness of breath. 06/01/12   Julianne Rice, MD  amoxicillin (AMOXIL) 500 MG capsule Take 1 capsule (500 mg total) by mouth 3 (three) times daily. 10/16/13   Orpah Greek, MD   HYDROcodone-homatropine [facility]) 5-1.5 MG/5ML syrup Take 5 mLs by mouth every 6 (six) hours as needed for cough. 10/16/13   Orpah Greek, MD  lisinopril-hydrochlorothiazide (PRINZIDE,ZESTORETIC) 20-25 MG per tablet Take 1 tablet by mouth daily.    Historical Provider, MD  montelukast (SINGULAIR) 10 MG tablet Take 10 mg by mouth daily.    Historical Provider, MD  predniSONE (DELTASONE) 20 MG tablet Take 3 tablets (60 mg total) by mouth daily with breakfast. 10/16/13   Orpah Greek, MD   BP 145/76 mmHg  Pulse 83  Temp(Src) 97.6 F (36.4 C) (Oral)  Resp 18  Ht 5\' 2"  (1.575 m)  Wt 174 lb (78.926 kg)  BMI 31.82 kg/m2  SpO2 100% Physical Exam  Constitutional: She is oriented to person, place, and time. She appears well-developed and well-nourished. No distress.  HENT:  Head: Normocephalic.  Eyes: EOM are normal.  Neck: Neck supple.  Cardiovascular: Normal rate and regular rhythm.   Pulmonary/Chest: Effort normal and breath sounds normal.  Abdominal: Soft.  Musculoskeletal: Normal range of motion.  Neurological: She is alert and oriented to person, place, and time. No cranial nerve deficit.  Skin: Skin is warm and dry.  Psychiatric: She has a normal mood and affect. Her behavior is normal.  Nursing note and vitals reviewed.   ED Course  Procedures   MDM   64 y.o. female here for medication refill. Will refill while she is trying  to become established with a new PCP. Stable for discharge with normal VS. Discussed with the patient and all questioned fully answered. She will return if any problems arise.      [place], NP 01/17/14 [place], MD 01/17/14 [phone]

## 2014-01-17 NOTE — Discharge Instructions (Signed)
Medication Refill, Emergency Department °We have refilled your medication today as a courtesy to you. It is best for your medical care, however, to take care of getting refills done through your primary caregiver's office. They have your records and can do a better job of follow-up than we can in the emergency department. °On maintenance medications, we often only prescribe enough medications to get you by until you are able to see your regular caregiver. This is a more expensive way to refill medications. °In the future, please plan for refills so that you will not have to use the emergency department for this. °Thank you for your help. Your help allows us to better take care of the daily emergencies that enter our department. °Document Released: 05/09/2003 Document Revised: 04/14/2011 Document Reviewed: 04/29/2013 °[employer]® Patient Information ©2015 [employer]. This information is not intended to replace advice given to you by your health care provider. Make sure you discuss any questions you have with your health care provider. ° °

## 2014-01-19 ENCOUNTER — Emergency Department (HOSPITAL_COMMUNITY)
Admission: EM | Admit: 2014-01-19 | Discharge: 2014-01-19 | Disposition: A | Discharge status: Home / Self Care | Payer: Medicare HMO | Attending: Emergency Medicine | Admitting: Emergency Medicine

## 2014-01-19 ENCOUNTER — Encounter (HOSPITAL_COMMUNITY): Payer: Self-pay | Admitting: Emergency Medicine

## 2014-01-19 DIAGNOSIS — Z8639 Personal history of other endocrine, nutritional and metabolic disease: Secondary | ICD-10-CM | POA: Diagnosis not present

## 2014-01-19 DIAGNOSIS — M2550 Pain in unspecified joint: Secondary | ICD-10-CM

## 2014-01-19 DIAGNOSIS — Z7952 Long term (current) use of systemic steroids: Secondary | ICD-10-CM | POA: Diagnosis not present

## 2014-01-19 DIAGNOSIS — M25511 Pain in right shoulder: Secondary | ICD-10-CM | POA: Insufficient documentation

## 2014-01-19 DIAGNOSIS — I10 Essential (primary) hypertension: Secondary | ICD-10-CM | POA: Diagnosis not present

## 2014-01-19 DIAGNOSIS — J45909 Unspecified asthma, uncomplicated: Secondary | ICD-10-CM | POA: Diagnosis not present

## 2014-01-19 DIAGNOSIS — Z8669 Personal history of other diseases of the nervous system and sense organs: Secondary | ICD-10-CM | POA: Insufficient documentation

## 2014-01-19 MED ORDER — ACETAMINOPHEN 325 MG PO TABS
650.0000 mg | ORAL_TABLET | Freq: Once | ORAL | Status: AC
  Administered 2014-01-19: 650 mg via ORAL
  Filled 2014-01-19: qty 2

## 2014-01-19 NOTE — ED Provider Notes (Signed)
CSN: [id number]     Arrival date & time 01/19/14  0827 History  This chart was scribed for Sharyon Cable, MD by Tula Nakayama, ED Scribe. This patient was seen in room APA09/APA09 and the patient's care was started at 8:39 AM.    Chief Complaint  Patient presents with  . Shoulder Pain   Patient is a 63 y.o. female presenting with shoulder pain. The history is provided by the patient. No language interpreter was used.  Shoulder Pain Location:  Shoulder Time since incident:  6 hours Injury: no   Shoulder location:  R shoulder Pain details:    Quality:  Unable to specify   Radiates to:  Does not radiate   Severity:  Moderate   Onset quality:  Gradual   Timing:  Constant   Progression:  Unchanged Dislocation: no   Foreign body present:  No foreign bodies Prior injury to area:  No Relieved by:  None tried Worsened by:  Movement Ineffective treatments:  None tried Associated symptoms: no back pain, no decreased range of motion, no fatigue, no fever, no neck pain, no numbness and no tingling   Risk factors: no frequent fractures     HPI Comments: Sheri Fleming is a 63 y.o. female who presents to the Emergency Department complaining of constant, nonradiating right shoulder pain that started 6 hours ago. Pt notes pain becomes worse with movement and touch. She has not tried any treatment for her symptoms. Pt states a history of shoulder pain that occurred a long time ago, but denies prior surgeries. She denies recent injuries or falls. Pt also denies diaphoresis, fatigue, numbness or weakness in her right hand, fever, vomiting, CP, SOB, neck pain and abdominal pain as associated symptoms.  Past Medical History  Diagnosis Date  . Asthma   . Hypertension   . Sleep apnea   . High cholesterol    Past Surgical History  Procedure Laterality Date  . Abdominal hysterectomy    . Fracture surgery     No family history on file. History  Substance Use Topics  . Smoking status: Never  Smoker   . Smokeless tobacco: Not on file  . Alcohol Use: No   OB History    No data available     Review of Systems  Constitutional: Negative for fever, diaphoresis and fatigue.  Respiratory: Negative for shortness of breath.   Cardiovascular: Negative for chest pain.  Gastrointestinal: Negative for vomiting and abdominal pain.  Musculoskeletal: Positive for arthralgias. Negative for back pain, joint swelling and neck pain.  Neurological: Negative for weakness and numbness.  All other systems reviewed and are negative.  Allergies  Review of patient's allergies indicates no known allergies.  Home Medications   Prior to Admission medications   Medication Sig Start Date End Date Taking? Authorizing Provider  albuterol (PROVENTIL HFA;VENTOLIN HFA) 108 (90 BASE) MCG/ACT inhaler Inhale 2 puffs into the lungs every 6 (six) hours as needed for wheezing.    Historical Provider, MD  albuterol (PROVENTIL) (2.5 MG/3ML) 0.083% nebulizer solution Take 2.5 mg by nebulization every 6 (six) hours as needed for shortness of breath. 06/01/12   Julianne Rice, MD  lisinopril-hydrochlorothiazide (PRINZIDE,ZESTORETIC) 20-25 MG per tablet Take 1 tablet by mouth daily. 01/17/14   Hope Bunnie Pion, NP  montelukast (SINGULAIR) 10 MG tablet Take 1 tablet (10 mg total) by mouth at bedtime. 01/17/14   Hope Bunnie Pion, NP  predniSONE (DELTASONE) 20 MG tablet Take 3 tablets (60 mg total) by mouth  daily with breakfast. 10/16/13   Orpah Greek, MD   BP 134/83 mmHg  Pulse 92  Temp(Src) 98 F (36.7 C) (Oral)  Resp 18  Ht 5\' 2"  (1.575 m)  Wt 170 lb (77.111 kg)  BMI 31.09 kg/m2  SpO2 99% Physical Exam  Nursing note and vitals reviewed.  CONSTITUTIONAL: Well developed/well nourished HEAD: Normocephalic/atraumatic EYES: EOMI/PERRL ENMT: Mucous membranes moist NECK: supple no meningeal signs SPINE/BACK:entire spine nontender CV: S1/S2 noted, no murmurs/rubs/gallops noted LUNGS: Lungs are clear to  auscultation bilaterally, no apparent distress ABDOMEN: soft, nontender, no rebound or guarding, bowel sounds noted throughout abdomen NEURO: Pt is awake/alert/appropriate, moves all extremitiesx4.  No facial droop.  Equal power (5/5) with hand grip, wrist flex/extension, elbow flex/extension, and equal power with shoulder abduction/adduction.  No focal sensory deficit to light touch is noted in either UE.   Equal (2+) biceps/brachioradialis/tricep reflex in bilateral UE EXTREMITIES: pulses normal/equal, full ROM; mild tenderness to right AC joint; no erythema or edema SKIN: warm, color normal PSYCH: no abnormalities of mood noted, alert and oriented to situation   ED Course  Procedures  DIAGNOSTIC STUDIES: Oxygen Saturation is 99% on RA, normal by my interpretation.    COORDINATION OF CARE: 8:44 AM Discussed treatment plan with pt which includes Tylenol. Pt agreed to plan. No signs of deformity or bony injury Imaging deferred Referred to ortho  MDM   Final diagnoses:  Right shoulder pain  Arthralgia    Nursing notes including past medical history and social history reviewed and considered in documentation   I personally performed the services described in this documentation, which was scribed in my presence. The recorded information has been reviewed and is accurate.      Sharyon Cable, MD 01/19/14 1003

## 2014-01-19 NOTE — ED Notes (Signed)
Patient with no complaints at this time. Respirations even and unlabored. Skin warm/dry. Discharge instructions reviewed with patient at this time. Patient given opportunity to voice concerns/ask questions. Patient discharged at this time and left Emergency Department with steady gait.   

## 2014-01-19 NOTE — Discharge Instructions (Signed)

## 2014-01-19 NOTE — ED Notes (Signed)
Patient c/o right shoulder pain. Per patient woke this morning with the pain. Denies any known injury. Denies any chest pain. Per patient worse with movement and touch. Full ROM noted.

## 2014-02-05 ENCOUNTER — Encounter (HOSPITAL_COMMUNITY): Payer: Self-pay | Admitting: Emergency Medicine

## 2014-02-05 ENCOUNTER — Emergency Department (HOSPITAL_COMMUNITY): Payer: Medicare HMO

## 2014-02-05 ENCOUNTER — Emergency Department (HOSPITAL_COMMUNITY)
Admission: EM | Admit: 2014-02-05 | Discharge: 2014-02-05 | Disposition: A | Discharge status: Home / Self Care | Payer: Medicare HMO | Attending: Emergency Medicine | Admitting: Emergency Medicine

## 2014-02-05 DIAGNOSIS — I10 Essential (primary) hypertension: Secondary | ICD-10-CM | POA: Diagnosis not present

## 2014-02-05 DIAGNOSIS — Z8669 Personal history of other diseases of the nervous system and sense organs: Secondary | ICD-10-CM | POA: Insufficient documentation

## 2014-02-05 DIAGNOSIS — M19011 Primary osteoarthritis, right shoulder: Secondary | ICD-10-CM | POA: Insufficient documentation

## 2014-02-05 DIAGNOSIS — J45909 Unspecified asthma, uncomplicated: Secondary | ICD-10-CM | POA: Insufficient documentation

## 2014-02-05 DIAGNOSIS — M25511 Pain in right shoulder: Secondary | ICD-10-CM | POA: Diagnosis present

## 2014-02-05 DIAGNOSIS — E780 Pure hypercholesterolemia: Secondary | ICD-10-CM | POA: Diagnosis not present

## 2014-02-05 DIAGNOSIS — Z79899 Other long term (current) drug therapy: Secondary | ICD-10-CM | POA: Diagnosis not present

## 2014-02-05 MED ORDER — TRAMADOL HCL 50 MG PO TABS: 50.0000 mg | ORAL_TABLET | Freq: Four times a day (QID) | ORAL | Status: DC | PRN

## 2014-02-05 NOTE — Discharge Instructions (Signed)
Arthritis, Nonspecific Arthritis is inflammation of a joint. This usually means pain, redness, warmth or swelling are present. One or more joints may be involved. There are a number of types of arthritis. Your caregiver may not be able to tell what type of arthritis you have right away. CAUSES  The most common cause of arthritis is the wear and tear on the joint (osteoarthritis). This causes damage to the cartilage, which can break down over time. The knees, hips, back and neck are most often affected by this type of arthritis. Other types of arthritis and common causes of joint pain include:  Sprains and other injuries near the joint. Sometimes minor sprains and injuries cause pain and swelling that develop hours later.  Rheumatoid arthritis. This affects hands, feet and knees. It usually affects both sides of your body at the same time. It is often associated with chronic ailments, fever, weight loss and general weakness.  Crystal arthritis. Gout and pseudo gout can cause occasional acute severe pain, redness and swelling in the foot, ankle, or knee.  Infectious arthritis. Bacteria can get into a joint through a break in overlying skin. This can cause infection of the joint. Bacteria and viruses can also spread through the blood and affect your joints.  Drug, infectious and allergy reactions. Sometimes joints can become mildly painful and slightly swollen with these types of illnesses. SYMPTOMS   Pain is the main symptom.  Your joint or joints can also be red, swollen and warm or hot to the touch.  You may have a fever with certain types of arthritis, or even feel overall ill.  The joint with arthritis will hurt with movement. Stiffness is present with some types of arthritis. DIAGNOSIS  Your caregiver will suspect arthritis based on your description of your symptoms and on your exam. Testing may be needed to find the type of arthritis:  Blood and sometimes urine tests.  X-ray tests  and sometimes CT or MRI scans.  Removal of fluid from the joint (arthrocentesis) is done to check for bacteria, crystals or other causes. Your caregiver (or a specialist) will numb the area over the joint with a local anesthetic, and use a needle to remove joint fluid for examination. This procedure is only minimally uncomfortable.  Even with these tests, your caregiver may not be able to tell what kind of arthritis you have. Consultation with a specialist (rheumatologist) may be helpful. TREATMENT  Your caregiver will discuss with you treatment specific to your type of arthritis. If the specific type cannot be determined, then the following general recommendations may apply. Treatment of severe joint pain includes:  Rest.  Elevation.  Anti-inflammatory medication (for example, ibuprofen) may be prescribed. Avoiding activities that cause increased pain.  Only take over-the-counter or prescription medicines for pain and discomfort as recommended by your caregiver.  Hot packs sometimes feel better, apply for 20 minutes several times daily, but do not use overnight. Do not use hot packs if you are diabetic without your caregiver's permission.  A cortisone shot into arthritic joints may help reduce pain and swelling.  Any acute arthritis that gets worse over the next 1 to 2 days needs to be looked at to be sure there is no joint infection. Long-term arthritis treatment involves modifying activities and lifestyle to reduce joint stress jarring. This can include weight loss. Also, exercise is needed to nourish the joint cartilage and remove waste. This helps keep the muscles around the joint strong. HOME CARE INSTRUCTIONS  Do not take aspirin to relieve pain if gout is suspected. This elevates uric acid levels.  Only take over-the-counter or prescription medicines for pain, discomfort or fever as directed by your caregiver.  Rest the joint as much as possible.  If your joint is swollen,  keep it elevated.  Use crutches if the painful joint is in your leg.  Drinking plenty of fluids may help for certain types of arthritis.  Follow your caregiver's dietary instructions.  Try low-impact exercise such as:  Swimming.  Water aerobics.  Biking.  Walking.  Morning stiffness is often relieved by a warm shower.  Put your joints through regular range-of-motion. SEEK MEDICAL CARE IF:   You do not feel better in 24 hours or are getting worse.  You have side effects to medications, or are not getting better with treatment. SEEK IMMEDIATE MEDICAL CARE IF:   You have a fever.  You develop severe joint pain, swelling or redness.  Many joints are involved and become painful and swollen.  There is severe back pain and/or leg weakness.  You have loss of bowel or bladder control. Document Released: 02/28/2004 Document Revised: 04/14/2011 Document Reviewed: 03/15/2008 [facility] Patient Information 2015 [place], [place]. This information is not intended to replace advice given to you by your health care provider. Make sure you discuss any questions you have with your health care provider.   You may take the tramadol prescribed for pain relief.  This will make you drowsy - do not drive within 4 hours of taking this medication.  I also recommend taking arthritis strength Tylenol (does not have to be the name brand - acetaminophen 650 mg every 6 hours may help relieve your arthritis pain).

## 2014-02-05 NOTE — ED Notes (Addendum)
Pt reports right shoulder pain since waking up yesterday. Pt reports pain is worse with movement. Pt reports history of same. Pt denies any cp,sob,numbness,tingling,new or recent injury. No obvious deformity noted.

## 2014-02-05 NOTE — ED Provider Notes (Signed)
CSN: [id number]     Arrival date & time 02/05/14  1356 History  This chart was scribed for non-physician practitioner, Evalee Jefferson, PA-C working with Richarda Blade, MD by Tula Nakayama, ED scribe. This patient was seen in room APFT24/APFT24 and the patient's care was started at 3:27 PM   Chief Complaint  Patient presents with  . Shoulder Pain   The history is provided by the patient. No language interpreter was used.    HPI Comments: Sheri Fleming is a 64 y.o. female with a history of HTN, asthma and high cholesterol who presents to the Emergency Department complaining of recurrent, moderate right shoulder pain that started yesterday. She notes pain is worse with movement. Pt was in the ED on 12/17 for the same symptoms and was prescribed Tylenol-650 which provided relief while taking. She states improvement of arm pain since last visit, but that pain returned yesterday. Pt states she sleeps on her left shoulder and denies new injuries. Pt has not followed up with PCP regarding shoulder pain. Pt is right-handed and does not work. She denies CP, SOB, neck pain, wheezing, numbness and tingling as associated symptoms.  Past Medical History  Diagnosis Date  . Asthma   . Hypertension   . Sleep apnea   . High cholesterol    Past Surgical History  Procedure Laterality Date  . Abdominal hysterectomy    . Fracture surgery     Family History  Problem Relation Age of Onset  . Stroke Mother   . Stroke Other    History  Substance Use Topics  . Smoking status: Never Smoker   . Smokeless tobacco: Never Used  . Alcohol Use: No   OB History    Gravida Para Term Preterm AB TAB SAB Ectopic Multiple Living   4 4 4       4      Review of Systems  Constitutional: Negative for fever.  Respiratory: Negative for shortness of breath and wheezing.   Cardiovascular: Negative for chest pain.  Musculoskeletal: Positive for arthralgias. Negative for myalgias, joint swelling and neck pain.   Neurological: Negative for weakness and numbness.  All other systems reviewed and are negative.  Allergies  Review of patient's allergies indicates no known allergies.  Home Medications   Prior to Admission medications   Medication Sig Start Date End Date Taking? Authorizing Provider  acetaminophen (TYLENOL) 500 MG tablet Take 500 mg by mouth every 6 (six) hours as needed for mild pain or moderate pain.   Yes Historical Provider, MD  albuterol (PROVENTIL HFA;VENTOLIN HFA) 108 (90 BASE) MCG/ACT inhaler Inhale into the lungs every 6 (six) hours as needed for wheezing or shortness of breath.   Yes Historical Provider, MD  lisinopril-hydrochlorothiazide (PRINZIDE,ZESTORETIC) 20-25 MG per tablet Take 1 tablet by mouth daily. 01/17/14  Yes Hope Bunnie Pion, NP  montelukast (SINGULAIR) 10 MG tablet Take 1 tablet (10 mg total) by mouth at bedtime. 01/17/14  Yes Hope Bunnie Pion, NP  simvastatin (ZOCOR) 20 MG tablet Take 20 mg by mouth every evening.   Yes Historical Provider, MD  traMADol (ULTRAM) 50 MG tablet Take 1 tablet (50 mg total) by mouth every 6 (six) hours as needed. 02/05/14   Evalee Jefferson, PA-C   BP 145/76 mmHg  Pulse 78  Temp(Src) 98.1 F (36.7 C) (Oral)  Resp 18  Ht 5\' 2"  (1.575 m)  Wt 170 lb (77.111 kg)  BMI 31.09 kg/m2  SpO2 100% Physical Exam  Constitutional: She appears well-developed  and well-nourished.  HENT:  Head: Atraumatic.  Neck: Normal range of motion.  Cardiovascular: Normal rate and regular rhythm.   Pulses equal bilaterally  Pulmonary/Chest: Effort normal and breath sounds normal. No respiratory distress. She has no wheezes. She has no rales.  Lungs clear  Musculoskeletal: She exhibits tenderness.  No tenderness along clavicle or AC joint; tender at her proximal lateral humeral head; no palpable deformity; no crepitus; pain is worse with active flexion; radial pulses intact  Neurological: She is alert. She has normal strength. She displays normal reflexes. No sensory  deficit.  Equal grip strength.  Skin: Skin is warm and dry.  Psychiatric: She has a normal mood and affect.  Nursing note and vitals reviewed.   ED Course  Procedures (including critical care time) DIAGNOSTIC STUDIES: Oxygen Saturation is 100% on RA, normal by my interpretation.    COORDINATION OF CARE: 3:33 PM Discussed treatment plan with pt at bedside and pt agreed to plan. Advised pt to follow up with an orthopedist.  Labs Review Labs Reviewed - No data to display  Imaging Review Dg Shoulder Right  02/05/2014   CLINICAL DATA:  Right shoulder pain since yesterday. No known injury.  EXAM: RIGHT SHOULDER - 2+ VIEW  COMPARISON:  None.  FINDINGS: There are mild to moderate AC joint degenerative changes. The glenohumeral joint is maintained. No acute fracture or abnormal soft tissue calcifications. The right lung is clear.  IMPRESSION: AC joint degenerative changes but no acute bony findings.   Electronically Signed   By: Kalman Jewels M.D.   On: 02/05/2014 14:37     EKG Interpretation None      MDM   Final diagnoses:  Osteoarthritis of right shoulder, unspecified osteoarthritis type    Patients labs and/or radiological studies were viewed and considered during the medical decision making and disposition process. Pt was encouraged to continue arthritis strength tylenol.  Tramadol prescribed for prn use also. Advised f/u for further care,  No pcp since hers retired.  Referrals given.  Also referral to ortho prn.  I personally performed the services described in this documentation, which was scribed in my presence. The recorded information has been reviewed and is accurate.    Evalee Jefferson, PA-C 02/06/14 [place], MD 02/08/14 1151

## 2014-03-08 ENCOUNTER — Emergency Department (HOSPITAL_COMMUNITY)
Admission: EM | Admit: 2014-03-08 | Discharge: 2014-03-08 | Disposition: A | Discharge status: Home / Self Care | Payer: Medicare HMO | Attending: Emergency Medicine | Admitting: Emergency Medicine

## 2014-03-08 ENCOUNTER — Encounter (HOSPITAL_COMMUNITY): Payer: Self-pay | Admitting: *Deleted

## 2014-03-08 DIAGNOSIS — Z79899 Other long term (current) drug therapy: Secondary | ICD-10-CM | POA: Diagnosis not present

## 2014-03-08 DIAGNOSIS — I10 Essential (primary) hypertension: Secondary | ICD-10-CM | POA: Insufficient documentation

## 2014-03-08 DIAGNOSIS — Z8669 Personal history of other diseases of the nervous system and sense organs: Secondary | ICD-10-CM | POA: Diagnosis not present

## 2014-03-08 DIAGNOSIS — Z760 Encounter for issue of repeat prescription: Secondary | ICD-10-CM | POA: Diagnosis present

## 2014-03-08 DIAGNOSIS — J45909 Unspecified asthma, uncomplicated: Secondary | ICD-10-CM | POA: Diagnosis not present

## 2014-03-08 DIAGNOSIS — E780 Pure hypercholesterolemia: Secondary | ICD-10-CM | POA: Diagnosis not present

## 2014-03-08 MED ORDER — MONTELUKAST SODIUM 10 MG PO TABS: 10.0000 mg | ORAL_TABLET | Freq: Every day | ORAL | Status: DC

## 2014-03-08 MED ORDER — LISINOPRIL 10 MG PO TABS
20.0000 mg | ORAL_TABLET | Freq: Once | ORAL | Status: AC
  Administered 2014-03-08: 20 mg via ORAL
  Filled 2014-03-08: qty 2

## 2014-03-08 MED ORDER — LISINOPRIL-HYDROCHLOROTHIAZIDE 20-25 MG PO TABS: 1.0000 | ORAL_TABLET | Freq: Every day | ORAL | Status: DC

## 2014-03-08 MED ORDER — HYDROCHLOROTHIAZIDE 25 MG PO TABS
25.0000 mg | ORAL_TABLET | Freq: Every day | ORAL | Status: DC
  Administered 2014-03-08: 25 mg via ORAL
  Filled 2014-03-08 (×2): qty 1

## 2014-03-08 NOTE — ED Notes (Addendum)
Pt states that she needs some blood pressure medications.  States the last time she had a prescription was when she was here last, and hasn't been able to find a physician to see.  Pt also states that she needs some of her asthma medications.

## 2014-03-08 NOTE — Discharge Instructions (Signed)
Medication Refill, Emergency Department °We have refilled your medication today as a courtesy to you. It is best for your medical care, however, to take care of getting refills done through your primary caregiver's office. They have your records and can do a better job of follow-up than we can in the emergency department. °On maintenance medications, we often only prescribe enough medications to get you by until you are able to see your regular caregiver. This is a more expensive way to refill medications. °In the future, please plan for refills so that you will not have to use the emergency department for this. °Thank you for your help. Your help allows us to better take care of the daily emergencies that enter our department. °Document Released: 05/09/2003 Document Revised: 04/14/2011 Document Reviewed: 04/29/2013 °[employer]® Patient Information ©2015 [employer]. This information is not intended to replace advice given to you by your health care provider. Make sure you discuss any questions you have with your health care provider. ° °

## 2014-03-11 NOTE — ED Provider Notes (Signed)
CSN: [id number]     Arrival date & time 03/08/14  1908 History   First MD Initiated Contact with Patient 03/08/14 2059     Chief Complaint  Patient presents with  . Medication Refill     (Consider location/radiation/quality/duration/timing/severity/associated sxs/prior Treatment) HPI  Sheri Fleming is a 64 y.o. female who presents to the Emergency Department requesting refill of her singular and her lisinopril/HCTZ.  She states she has been without her medications for several weeks and her last refill was from here as well.  She has not been able to establish primary care in this area.  She denies any symptoms at this time.      Past Medical History  Diagnosis Date  . Asthma   . Hypertension   . Sleep apnea   . High cholesterol    Past Surgical History  Procedure Laterality Date  . Abdominal hysterectomy    . Fracture surgery     Family History  Problem Relation Age of Onset  . Stroke Mother   . Stroke Other    History  Substance Use Topics  . Smoking status: Never Smoker   . Smokeless tobacco: Never Used  . Alcohol Use: No   OB History    Gravida Para Term Preterm AB TAB SAB Ectopic Multiple Living   4 4 4       4      Review of Systems  Constitutional: Negative for fever, chills and fatigue.  Respiratory: Negative for cough and shortness of breath.   Cardiovascular: Negative for chest pain and palpitations.  Gastrointestinal: Negative for nausea, vomiting and abdominal pain.  Musculoskeletal: Negative for myalgias, back pain, arthralgias, neck pain and neck stiffness.  Skin: Negative for rash.  Neurological: Negative for dizziness, weakness, light-headedness, numbness and headaches.  All other systems reviewed and are negative.     Allergies  Review of patient's allergies indicates no known allergies.  Home Medications   Prior to Admission medications   Medication Sig Start Date End Date Taking? Authorizing Provider  acetaminophen (TYLENOL) 500 MG  tablet Take 500 mg by mouth every 6 (six) hours as needed for mild pain or moderate pain.    Historical Provider, MD  albuterol (PROVENTIL HFA;VENTOLIN HFA) 108 (90 BASE) MCG/ACT inhaler Inhale into the lungs every 6 (six) hours as needed for wheezing or shortness of breath.    Historical Provider, MD  lisinopril-hydrochlorothiazide (PRINZIDE,ZESTORETIC) 20-25 MG per tablet Take 1 tablet by mouth daily. 03/08/14   Careena Degraffenreid L. Leelyn Jasinski, PA-C  montelukast (SINGULAIR) 10 MG tablet Take 1 tablet (10 mg total) by mouth at bedtime. 03/08/14   Judianne Seiple L. Jonavan Vanhorn, PA-C  simvastatin (ZOCOR) 20 MG tablet Take 20 mg by mouth every evening.    Historical Provider, MD  traMADol (ULTRAM) 50 MG tablet Take 1 tablet (50 mg total) by mouth every 6 (six) hours as needed. 02/05/14   Evalee Jefferson, PA-C   BP 161/82 mmHg  Pulse 75  Temp(Src) 98 F (36.7 C) (Oral)  Resp 20  Ht 5\' 3"  (1.6 m)  Wt 175 lb (79.379 kg)  BMI 31.01 kg/m2  SpO2 100% Physical Exam  Constitutional: She is oriented to person, place, and time. She appears well-developed and well-nourished. No distress.  HENT:  Head: Normocephalic and atraumatic.  Mouth/Throat: Oropharynx is clear and moist.  Neck: Normal range of motion. Neck supple.  Cardiovascular: Normal rate, regular rhythm, normal heart sounds and intact distal pulses.   No murmur heard. Pulmonary/Chest: Effort normal and breath sounds  normal. No respiratory distress. She exhibits no tenderness.  Abdominal: Soft. She exhibits no distension. There is no tenderness. There is no rebound and no guarding.  Musculoskeletal: Normal range of motion. She exhibits no tenderness.  Lymphadenopathy:    She has no cervical adenopathy.  Neurological: She is alert and oriented to person, place, and time. She exhibits normal muscle tone. Coordination normal.  Skin: Skin is warm and dry.  Nursing note and vitals reviewed.   ED Course  Procedures (including critical care time) Labs Review Labs Reviewed - No  data to display  Imaging Review No results found.   EKG Interpretation None      MDM   Final diagnoses:  Medication refill    Patient is well appearing, vitals stable.  Patient was given resource guide including [facility].  She agrees to arrange follow-up.  Appears stable for d/c.  rx's written for singular and lisinopril/HCTZ  Jaelee Laughter L. Ammie Ferrier 03/11/14 2111  Carmin Muskrat, MD 03/16/14 [phone]

## 2014-03-19 ENCOUNTER — Emergency Department (HOSPITAL_COMMUNITY)
Admission: EM | Admit: 2014-03-19 | Discharge: 2014-03-19 | Disposition: A | Discharge status: Home / Self Care | Payer: Commercial Managed Care - HMO | Attending: Emergency Medicine | Admitting: Emergency Medicine

## 2014-03-19 ENCOUNTER — Encounter (HOSPITAL_COMMUNITY): Payer: Self-pay | Admitting: Emergency Medicine

## 2014-03-19 DIAGNOSIS — Z90710 Acquired absence of both cervix and uterus: Secondary | ICD-10-CM | POA: Diagnosis not present

## 2014-03-19 DIAGNOSIS — R109 Unspecified abdominal pain: Secondary | ICD-10-CM | POA: Diagnosis present

## 2014-03-19 DIAGNOSIS — J45909 Unspecified asthma, uncomplicated: Secondary | ICD-10-CM | POA: Diagnosis not present

## 2014-03-19 DIAGNOSIS — R1011 Right upper quadrant pain: Secondary | ICD-10-CM | POA: Diagnosis not present

## 2014-03-19 DIAGNOSIS — I10 Essential (primary) hypertension: Secondary | ICD-10-CM | POA: Insufficient documentation

## 2014-03-19 DIAGNOSIS — Z79899 Other long term (current) drug therapy: Secondary | ICD-10-CM | POA: Diagnosis not present

## 2014-03-19 DIAGNOSIS — Z8669 Personal history of other diseases of the nervous system and sense organs: Secondary | ICD-10-CM | POA: Diagnosis not present

## 2014-03-19 DIAGNOSIS — E780 Pure hypercholesterolemia: Secondary | ICD-10-CM | POA: Diagnosis not present

## 2014-03-19 DIAGNOSIS — R1012 Left upper quadrant pain: Secondary | ICD-10-CM | POA: Insufficient documentation

## 2014-03-19 MED ORDER — PROMETHAZINE HCL 12.5 MG PO TABS
25.0000 mg | ORAL_TABLET | Freq: Once | ORAL | Status: AC
  Administered 2014-03-19: 25 mg via ORAL
  Filled 2014-03-19: qty 2

## 2014-03-19 MED ORDER — HYDROCODONE-ACETAMINOPHEN 5-325 MG PO TABS: 1.0000 | ORAL_TABLET | ORAL | Status: DC | PRN

## 2014-03-19 MED ORDER — OXYCODONE-ACETAMINOPHEN 5-325 MG PO TABS
1.0000 | ORAL_TABLET | Freq: Once | ORAL | Status: AC
  Administered 2014-03-19: 1 via ORAL
  Filled 2014-03-19: qty 1

## 2014-03-19 MED ORDER — PROMETHAZINE HCL 25 MG PO TABS: 25.0000 mg | ORAL_TABLET | Freq: Four times a day (QID) | ORAL | Status: DC | PRN

## 2014-03-19 MED ORDER — PANTOPRAZOLE SODIUM 40 MG PO TBEC
40.0000 mg | DELAYED_RELEASE_TABLET | Freq: Once | ORAL | Status: AC
  Administered 2014-03-19: 40 mg via ORAL
  Filled 2014-03-19: qty 1

## 2014-03-19 MED ORDER — FAMOTIDINE 20 MG PO TABS: 20.0000 mg | ORAL_TABLET | Freq: Two times a day (BID) | ORAL | Status: DC

## 2014-03-19 NOTE — ED Notes (Signed)
Pt reports lower abdominal pain x2 days. Pt denies n/v/d. LBM yesterday.

## 2014-03-19 NOTE — ED Provider Notes (Signed)
CSN: [id number]     Arrival date & time 03/19/14  1505 History  This chart was scribed for Nat Christen, MD by Zola Button, ED Scribe. This patient was seen in room APA07/APA07 and the patient's care was started at 7:12 AM.       Chief Complaint  Patient presents with  . Abdominal Pain   The history is provided by the patient. No language interpreter was used.   HPI Comments: Sheri Fleming is a 64 y.o. female with a PSHx of abdominal hysterectomy who presents to the Emergency Department complaining of gradual onset, intermittent bilateral mid and upper abdominal pain that started yesterday. Patient notes that she has been eating greasy foods and believes she has acid reflux. Her abdominal pain episodes typically last a few minutes. She notes that she has been eating fine and that her last bowel movement was yesterday, without any problems. Patient denies vomiting, diarrhea, appetite loss and fever. She also denies smoking and using EtOH. She states that she has had similar pain a long time ago.  PCP: Dr. Criss Rosales  Past Medical History  Diagnosis Date  . Asthma   . Hypertension   . Sleep apnea   . High cholesterol    Past Surgical History  Procedure Laterality Date  . Abdominal hysterectomy    . Fracture surgery     Family History  Problem Relation Age of Onset  . Stroke Mother   . Stroke Other    History  Substance Use Topics  . Smoking status: Never Smoker   . Smokeless tobacco: Never Used  . Alcohol Use: No   OB History    Gravida Para Term Preterm AB TAB SAB Ectopic Multiple Living   4 4 4       4      Review of Systems  Constitutional: Negative for fever and appetite change.  Gastrointestinal: Positive for abdominal pain. Negative for nausea, vomiting and diarrhea.  All other systems reviewed and are negative.     Allergies  Review of patient's allergies indicates no known allergies.  Home Medications   Prior to Admission medications   Medication Sig Start Date  End Date Taking? Authorizing Provider  acetaminophen (TYLENOL) 500 MG tablet Take 500 mg by mouth every 6 (six) hours as needed for mild pain or moderate pain.    Historical Provider, MD  albuterol (PROVENTIL HFA;VENTOLIN HFA) 108 (90 BASE) MCG/ACT inhaler Inhale into the lungs every 6 (six) hours as needed for wheezing or shortness of breath.    Historical Provider, MD  famotidine (PEPCID) 20 MG tablet Take 1 tablet (20 mg total) by mouth 2 (two) times daily. 03/19/14   Nat Christen, MD  HYDROcodone-acetaminophen (NORCO) 5-325 MG per tablet Take 1-2 tablets by mouth every 4 (four) hours as needed. 03/19/14   Nat Christen, MD  lisinopril-hydrochlorothiazide (PRINZIDE,ZESTORETIC) 20-25 MG per tablet Take 1 tablet by mouth daily. 03/08/14   Tammy L. Triplett, PA-C  montelukast (SINGULAIR) 10 MG tablet Take 1 tablet (10 mg total) by mouth at bedtime. 03/08/14   Tammy L. Triplett, PA-C  promethazine (PHENERGAN) 25 MG tablet Take 1 tablet (25 mg total) by mouth every 6 (six) hours as needed. 03/19/14   Nat Christen, MD  simvastatin (ZOCOR) 20 MG tablet Take 20 mg by mouth every evening.    Historical Provider, MD  traMADol (ULTRAM) 50 MG tablet Take 1 tablet (50 mg total) by mouth every 6 (six) hours as needed. 02/05/14   Evalee Jefferson, PA-C  BP 146/83 mmHg  Pulse 91  Temp(Src) 98.1 F (36.7 C) (Oral)  Resp 18  Ht 5\' 2"  (1.575 m)  Wt 170 lb (77.111 kg)  BMI 31.09 kg/m2  SpO2 100% Physical Exam  Constitutional: She is oriented to person, place, and time. She appears well-developed and well-nourished.  HENT:  Head: Normocephalic and atraumatic.  Eyes: Conjunctivae and EOM are normal. Pupils are equal, round, and reactive to light.  Neck: Normal range of motion. Neck supple.  Cardiovascular: Normal rate and regular rhythm.   Pulmonary/Chest: Effort normal and breath sounds normal.  Abdominal: Soft. Bowel sounds are normal. There is tenderness.  Minimal RUQ and LUQ tenderness.  Musculoskeletal: Normal range of  motion.  Neurological: She is alert and oriented to person, place, and time.  Skin: Skin is warm and dry.  Psychiatric: She has a normal mood and affect. Her behavior is normal.  Nursing note and vitals reviewed.   ED Course  Procedures  DIAGNOSTIC STUDIES: Oxygen Saturation is 100% on room air, normal by my interpretation.    COORDINATION OF CARE: 7:34 AM-Discussed treatment plan which includes medications with pt at bedside and pt agreed to plan. Patient did not want to have blood work done.   Labs Review Labs Reviewed - No data to display  Imaging Review No results found.   EKG Interpretation None      MDM   Final diagnoses:  Abdominal pain, unspecified abdominal location   Patient is in no acute distress. No acute abdomen. She is eating without vomiting or diarrhea. She does not want a workup. Discharge medications Norco, Pepcid 20 mg, Phenergan 25 mg   I personally performed the services described in this documentation, which was scribed in my presence. The recorded information has been reviewed and is accurate.    Nat Christen, MD 03/19/14 [phone]

## 2014-03-19 NOTE — Discharge Instructions (Signed)
Abdominal Pain Many things can cause belly (abdominal) pain. Most times, the belly pain is not dangerous. Many cases of belly pain can be watched and treated at home. HOME CARE   Do not take medicines that help you go poop (laxatives) unless told to by your doctor.  Only take medicine as told by your doctor.  Eat or drink as told by your doctor. Your doctor will tell you if you should be on a special diet. GET HELP IF:  You do not know what is causing your belly pain.  You have belly pain while you are sick to your stomach (nauseous) or have runny poop (diarrhea).  You have pain while you pee or poop.  Your belly pain wakes you up at night.  You have belly pain that gets worse or better when you eat.  You have belly pain that gets worse when you eat fatty foods.  You have a fever. GET HELP RIGHT AWAY IF:   The pain does not go away within 2 hours.  You keep throwing up (vomiting).  The pain changes and is only in the right or left part of the belly.  You have bloody or tarry looking poop. MAKE SURE YOU:   Understand these instructions.  Will watch your condition.  Will get help right away if you are not doing well or get worse. Document Released: 07/09/2007 Document Revised: 01/25/2013 Document Reviewed: 09/29/2012 [facility] Patient Information 2015 [place], [place]. This information is not intended to replace advice given to you by your health care provider. Make sure you discuss any questions you have with your health care provider.  Medication for pain, nausea, stomach irritation.  Clear liquids.  Return if worse.

## 2014-05-12 ENCOUNTER — Other Ambulatory Visit (HOSPITAL_COMMUNITY): Payer: Self-pay | Admitting: Radiology

## 2014-05-12 DIAGNOSIS — G4730 Sleep apnea, unspecified: Secondary | ICD-10-CM

## 2014-06-14 ENCOUNTER — Emergency Department (HOSPITAL_COMMUNITY): Payer: Commercial Managed Care - HMO

## 2014-06-14 ENCOUNTER — Emergency Department (HOSPITAL_COMMUNITY)
Admission: EM | Admit: 2014-06-14 | Discharge: 2014-06-14 | Disposition: A | Discharge status: Home / Self Care | Payer: Commercial Managed Care - HMO | Attending: Emergency Medicine | Admitting: Emergency Medicine

## 2014-06-14 ENCOUNTER — Encounter (HOSPITAL_COMMUNITY): Payer: Self-pay | Admitting: Emergency Medicine

## 2014-06-14 DIAGNOSIS — J069 Acute upper respiratory infection, unspecified: Secondary | ICD-10-CM | POA: Diagnosis not present

## 2014-06-14 DIAGNOSIS — Z79899 Other long term (current) drug therapy: Secondary | ICD-10-CM | POA: Diagnosis not present

## 2014-06-14 DIAGNOSIS — R0981 Nasal congestion: Secondary | ICD-10-CM | POA: Diagnosis present

## 2014-06-14 DIAGNOSIS — J45909 Unspecified asthma, uncomplicated: Secondary | ICD-10-CM | POA: Diagnosis not present

## 2014-06-14 DIAGNOSIS — I10 Essential (primary) hypertension: Secondary | ICD-10-CM | POA: Diagnosis not present

## 2014-06-14 DIAGNOSIS — Z8669 Personal history of other diseases of the nervous system and sense organs: Secondary | ICD-10-CM | POA: Diagnosis not present

## 2014-06-14 DIAGNOSIS — E780 Pure hypercholesterolemia: Secondary | ICD-10-CM | POA: Diagnosis not present

## 2014-06-14 NOTE — ED Provider Notes (Signed)
CSN: [id number]     Arrival date & time 06/14/14  1714 History   First MD Initiated Contact with Patient 06/14/14 1854     Chief Complaint  Patient presents with  . Nasal Congestion  . Cough     (Consider location/radiation/quality/duration/timing/severity/associated sxs/prior Treatment) Patient is a 64 y.o. female presenting with URI. The history is provided by the patient.  URI Presenting symptoms: congestion and cough   Severity:  Moderate Onset quality:  Gradual Duration:  16 hours Timing:  Intermittent Progression:  Unchanged Chronicity:  New Relieved by:  Nothing Worsened by:  Nothing tried Ineffective treatments:  Inhaler Risk factors: chronic respiratory disease    LORIS SEELYE is a 64 y.o. female with a hx of asthma presents to the ED with cough, cold and congestion that started about 4am. She states that she is coughing up green sputum. She used her inhaler x 1 although she does not feel like she is wheezing.   Past Medical History  Diagnosis Date  . Asthma   . Hypertension   . Sleep apnea   . High cholesterol    Past Surgical History  Procedure Laterality Date  . Abdominal hysterectomy    . Fracture surgery     Family History  Problem Relation Age of Onset  . Stroke Mother   . Stroke Other    History  Substance Use Topics  . Smoking status: Never Smoker   . Smokeless tobacco: Never Used  . Alcohol Use: No   OB History    Gravida Para Term Preterm AB TAB SAB Ectopic Multiple Living   4 4 4       4      Review of Systems  HENT: Positive for congestion.   Respiratory: Positive for cough.   all other systems negative    Allergies  Review of patient's allergies indicates no known allergies.  Home Medications   Prior to Admission medications   Medication Sig Start Date End Date Taking? Authorizing Provider  acetaminophen (TYLENOL) 500 MG tablet Take 500 mg by mouth every 6 (six) hours as needed for mild pain or moderate pain.    Historical  Provider, MD  albuterol (PROVENTIL HFA;VENTOLIN HFA) 108 (90 BASE) MCG/ACT inhaler Inhale into the lungs every 6 (six) hours as needed for wheezing or shortness of breath.    Historical Provider, MD  famotidine (PEPCID) 20 MG tablet Take 1 tablet (20 mg total) by mouth 2 (two) times daily. 03/19/14   Nat Christen, MD  HYDROcodone-acetaminophen (NORCO) 5-325 MG per tablet Take 1-2 tablets by mouth every 4 (four) hours as needed. 03/19/14   Nat Christen, MD  lisinopril-hydrochlorothiazide (PRINZIDE,ZESTORETIC) 20-25 MG per tablet Take 1 tablet by mouth daily. 03/08/14   Tammy Triplett, PA-C  montelukast (SINGULAIR) 10 MG tablet Take 1 tablet (10 mg total) by mouth at bedtime. 03/08/14   Tammy Triplett, PA-C  promethazine (PHENERGAN) 25 MG tablet Take 1 tablet (25 mg total) by mouth every 6 (six) hours as needed. 03/19/14   Nat Christen, MD  simvastatin (ZOCOR) 20 MG tablet Take 20 mg by mouth every evening.    Historical Provider, MD  traMADol (ULTRAM) 50 MG tablet Take 1 tablet (50 mg total) by mouth every 6 (six) hours as needed. 02/05/14   Evalee Jefferson, PA-C   BP 131/76 mmHg  Pulse 83  Temp(Src) 98.4 F (36.9 C) (Oral)  Resp 16  Ht 5\' 3"  (1.6 m)  Wt 174 lb (78.926 kg)  BMI 30.83 kg/m2  SpO2 99% Physical Exam  Constitutional: She is oriented to person, place, and time. She appears well-developed and well-nourished. No distress.  HENT:  Head: Normocephalic.  Right Ear: Tympanic membrane normal.  Left Ear: Tympanic membrane normal.  Nose: Rhinorrhea present.  Mouth/Throat: Uvula is midline, oropharynx is clear and moist and mucous membranes are normal.  Eyes: EOM are normal.  Neck: Normal range of motion. Neck supple.  Cardiovascular: Normal rate and regular rhythm.   Pulmonary/Chest: Effort normal. She has no wheezes. She has no rales.  Musculoskeletal: Normal range of motion.  Neurological: She is alert and oriented to person, place, and time. No cranial nerve deficit.  Skin: Skin is warm and dry.   Psychiatric: She has a normal mood and affect. Her behavior is normal.  Nursing note and vitals reviewed.   ED Course  Procedures (including critical care time) Labs Review Labs Reviewed - No data to display  Imaging Review Dg Chest 2 View  06/14/2014   CLINICAL DATA:  Cough, chest congestion, and shortness of breath since this morning. Asthma and hypertension.  EXAM: CHEST  2 VIEW  COMPARISON:  10/16/2013  FINDINGS: The heart size and mediastinal contours are within normal limits. Both lungs are clear. No evidence of pleural effusion. Chronic mild elevation of left hemidiaphragm again noted. No definite mass or lymphadenopathy identified.  IMPRESSION: Stable exam.  No active cardiopulmonary disease.   Electronically Signed   By: Earle Gell M.D.   On: 06/14/2014 18:18     MDM  64 y.o. female with congestion and cough that started about 4 am. Stable for d/c with normal chest x-ray, no fever and no respiratory distress. Dr. Rogene Houston in to see the patient and discussed use of OTC medications for treatment of URI. Patient voices understanding and agrees with plan.   Final diagnoses:  URI (upper respiratory infection)       Ashley Murrain, NP 06/14/14 1956

## 2014-06-14 NOTE — ED Notes (Signed)
Pt states that she started coughing with runny nose and congestion about 0300.

## 2014-06-14 NOTE — ED Provider Notes (Signed)
Medical screening examination/treatment/procedure(s) were conducted as a shared visit with non-physician practitioner(s) and myself.  I personally evaluated the patient during the encounter.   EKG Interpretation None      Results for orders placed or performed during the hospital encounter of 12/27/10  I-STAT, chem 8  Result Value Ref Range   Sodium 142 135 - 145 mEq/L   Potassium 3.9 3.5 - 5.1 mEq/L   Chloride 104 96 - 112 mEq/L   BUN 18 6 - 23 mg/dL   Creatinine, Ser 0.80 0.50 - 1.10 mg/dL   Glucose, Bld 94 70 - 99 mg/dL   Calcium, Ion 1.17 1.12 - 1.32 mmol/L   TCO2 26 0 - 100 mmol/L   Hemoglobin 12.9 12.0 - 15.0 g/dL   HCT 38.0 36.0 - 46.0 %   Dg Chest 2 View  06/14/2014   CLINICAL DATA:  Cough, chest congestion, and shortness of breath since this morning. Asthma and hypertension.  EXAM: CHEST  2 VIEW  COMPARISON:  10/16/2013  FINDINGS: The heart size and mediastinal contours are within normal limits. Both lungs are clear. No evidence of pleural effusion. Chronic mild elevation of left hemidiaphragm again noted. No definite mass or lymphadenopathy identified.  IMPRESSION: Stable exam.  No active cardiopulmonary disease.   Electronically Signed   By: Earle Gell M.D.   On: 06/14/2014 18:18    Patient with acute onset of coughing and runny nose and congestion at 3 today. Chest x-ray without evidence of pneumonia pneumothorax or pulmonary edema. Patient's room air saturations are 100%. Patient stable for discharge home. Lungs clear bilaterally.  Fredia Sorrow, MD 06/14/14 1910

## 2014-06-14 NOTE — Discharge Instructions (Signed)
Use Mucinex DM for your cough and congestion. Follow up with your doctor.

## 2014-06-14 NOTE — ED Notes (Signed)
Patient with no complaints at this time. Respirations even and unlabored. Skin warm/dry. Discharge instructions reviewed with patient at this time. Patient given opportunity to voice concerns/ask questions. Patient discharged at this time and left Emergency Department with steady gait.   

## 2014-09-01 ENCOUNTER — Other Ambulatory Visit (HOSPITAL_COMMUNITY): Payer: Self-pay | Admitting: Respiratory Therapy

## 2014-09-18 ENCOUNTER — Other Ambulatory Visit (HOSPITAL_COMMUNITY): Payer: Self-pay | Admitting: Respiratory Therapy

## 2014-11-14 ENCOUNTER — Emergency Department (HOSPITAL_COMMUNITY)
Admission: EM | Admit: 2014-11-14 | Discharge: 2014-11-14 | Disposition: A | Discharge status: Home / Self Care | Payer: Commercial Managed Care - HMO | Attending: Emergency Medicine | Admitting: Emergency Medicine

## 2014-11-14 ENCOUNTER — Encounter (HOSPITAL_COMMUNITY): Payer: Self-pay | Admitting: Emergency Medicine

## 2014-11-14 DIAGNOSIS — I10 Essential (primary) hypertension: Secondary | ICD-10-CM | POA: Insufficient documentation

## 2014-11-14 DIAGNOSIS — Z8669 Personal history of other diseases of the nervous system and sense organs: Secondary | ICD-10-CM | POA: Diagnosis not present

## 2014-11-14 DIAGNOSIS — Z79899 Other long term (current) drug therapy: Secondary | ICD-10-CM | POA: Diagnosis not present

## 2014-11-14 DIAGNOSIS — J069 Acute upper respiratory infection, unspecified: Secondary | ICD-10-CM

## 2014-11-14 DIAGNOSIS — E7800 Pure hypercholesterolemia, unspecified: Secondary | ICD-10-CM | POA: Insufficient documentation

## 2014-11-14 DIAGNOSIS — R05 Cough: Secondary | ICD-10-CM | POA: Diagnosis present

## 2014-11-14 DIAGNOSIS — J45909 Unspecified asthma, uncomplicated: Secondary | ICD-10-CM | POA: Diagnosis not present

## 2014-11-14 MED ORDER — PREDNISONE 10 MG PO TABS: ORAL_TABLET | ORAL | Status: DC

## 2014-11-14 MED ORDER — LORATADINE-PSEUDOEPHEDRINE ER 5-120 MG PO TB12: 1.0000 | ORAL_TABLET | Freq: Two times a day (BID) | ORAL | Status: DC

## 2014-11-14 NOTE — Discharge Instructions (Signed)

## 2014-11-14 NOTE — ED Notes (Signed)
Patient complaining of cough and nasal congestion since Saturday.

## 2014-11-14 NOTE — ED Provider Notes (Signed)
CSN: [id number]     Arrival date & time 11/14/14  1645 History   First MD Initiated Contact with Patient 11/14/14 1806     Chief Complaint  Patient presents with  . Cough  . Nasal Congestion     (Consider location/radiation/quality/duration/timing/severity/associated sxs/prior Treatment) Patient is a 64 y.o. female presenting with cough. The history is provided by the patient.  Cough Cough characteristics:  Non-productive Severity:  Moderate Onset quality:  Sudden Duration:  3 days Timing:  Intermittent Progression:  Worsening Chronicity:  New Smoker: no   Context: weather changes   Relieved by:  Nothing Worsened by:  Nothing tried Associated symptoms: rhinorrhea and sinus congestion   Associated symptoms: no chills, no fever, no sore throat and no wheezing   Risk factors: no recent travel     Past Medical History  Diagnosis Date  . Asthma   . Hypertension   . Sleep apnea   . High cholesterol    Past Surgical History  Procedure Laterality Date  . Abdominal hysterectomy    . Fracture surgery     Family History  Problem Relation Age of Onset  . Stroke Mother   . Stroke Other    Social History  Substance Use Topics  . Smoking status: Never Smoker   . Smokeless tobacco: Never Used  . Alcohol Use: No   OB History    Gravida Para Term Preterm AB TAB SAB Ectopic Multiple Living   4 4 4       4      Review of Systems  Constitutional: Negative for fever and chills.  HENT: Positive for rhinorrhea. Negative for sore throat.   Respiratory: Positive for cough. Negative for wheezing.   All other systems reviewed and are negative.     Allergies  Review of patient's allergies indicates no known allergies.  Home Medications   Prior to Admission medications   Medication Sig Start Date End Date Taking? Authorizing Provider  acetaminophen (TYLENOL) 500 MG tablet Take 500 mg by mouth every 6 (six) hours as needed for mild pain or moderate pain.    Historical  Provider, MD  albuterol (PROVENTIL HFA;VENTOLIN HFA) 108 (90 BASE) MCG/ACT inhaler Inhale into the lungs every 6 (six) hours as needed for wheezing or shortness of breath.    Historical Provider, MD  famotidine (PEPCID) 20 MG tablet Take 1 tablet (20 mg total) by mouth 2 (two) times daily. 03/19/14   Nat Christen, MD  HYDROcodone-acetaminophen (NORCO) 5-325 MG per tablet Take 1-2 tablets by mouth every 4 (four) hours as needed. 03/19/14   Nat Christen, MD  lisinopril-hydrochlorothiazide (PRINZIDE,ZESTORETIC) 20-25 MG per tablet Take 1 tablet by mouth daily. 03/08/14   Tammy Triplett, PA-C  montelukast (SINGULAIR) 10 MG tablet Take 1 tablet (10 mg total) by mouth at bedtime. 03/08/14   Tammy Triplett, PA-C  promethazine (PHENERGAN) 25 MG tablet Take 1 tablet (25 mg total) by mouth every 6 (six) hours as needed. 03/19/14   Nat Christen, MD  simvastatin (ZOCOR) 20 MG tablet Take 20 mg by mouth every evening.    Historical Provider, MD  traMADol (ULTRAM) 50 MG tablet Take 1 tablet (50 mg total) by mouth every 6 (six) hours as needed. 02/05/14   Evalee Jefferson, PA-C   BP 140/81 mmHg  Pulse 87  Temp(Src) 98.2 F (36.8 C) (Oral)  Resp 15  Ht 5\' 2"  (1.575 m)  Wt 170 lb 6.4 oz (77.293 kg)  BMI 31.16 kg/m2  SpO2 98% Physical Exam  Constitutional:  She is oriented to person, place, and time. She appears well-developed and well-nourished.  Non-toxic appearance.  HENT:  Head: Normocephalic.  Right Ear: Tympanic membrane and external ear normal.  Left Ear: Tympanic membrane and external ear normal.  Mild to moderate nasal congestion present.  Eyes: EOM and lids are normal. Pupils are equal, round, and reactive to light.  Neck: Normal range of motion. Neck supple. Carotid bruit is not present.  Cardiovascular: Normal rate, regular rhythm, normal heart sounds, intact distal pulses and normal pulses.   Pulmonary/Chest: Breath sounds normal. No respiratory distress. She has no wheezes. She has no rales. She exhibits no  tenderness.  Patient speaks in complete sentences without problem.  Abdominal: Soft. Bowel sounds are normal. There is no tenderness. There is no guarding.  Musculoskeletal: Normal range of motion. She exhibits no edema.  Negative Homans sign  Lymphadenopathy:       Head (right side): No submandibular adenopathy present.       Head (left side): No submandibular adenopathy present.    She has no cervical adenopathy.  Neurological: She is alert and oriented to person, place, and time. She has normal strength. No cranial nerve deficit or sensory deficit.  Skin: Skin is warm and dry.  Psychiatric: She has a normal mood and affect. Her speech is normal.  Nursing note and vitals reviewed.   ED Course  Procedures (including critical care time) Labs Review Labs Reviewed - No data to display  Imaging Review No results found. I have personally reviewed and evaluated these images and lab results as part of my medical decision-making.   EKG Interpretation None      MDM  The examination suggest an upper respiratory infection. Vital signs reviewed. Patient speaks in complete sentences without problem. The plan at this time is for the patient to increase fluids. She will be given on Claritin-D and prednisone taper. The patient is to follow with her primary physician for recheck.    Final diagnoses:  None    **I have reviewed nursing notes, vital signs, and all appropriate lab and imaging results for this patient.Lily Kocher, PA-C 11/14/14 Bosie Helper  Lacretia Leigh, MD 11/15/14 [id number]

## 2015-04-24 DIAGNOSIS — G4730 Sleep apnea, unspecified: Secondary | ICD-10-CM | POA: Diagnosis not present

## 2015-04-24 DIAGNOSIS — I10 Essential (primary) hypertension: Secondary | ICD-10-CM | POA: Diagnosis not present

## 2015-04-24 DIAGNOSIS — R7309 Other abnormal glucose: Secondary | ICD-10-CM | POA: Diagnosis not present

## 2015-04-24 DIAGNOSIS — E7800 Pure hypercholesterolemia, unspecified: Secondary | ICD-10-CM | POA: Diagnosis not present

## 2015-07-17 DIAGNOSIS — I10 Essential (primary) hypertension: Secondary | ICD-10-CM | POA: Diagnosis not present

## 2015-07-17 DIAGNOSIS — R7309 Other abnormal glucose: Secondary | ICD-10-CM | POA: Diagnosis not present

## 2015-07-17 DIAGNOSIS — G4730 Sleep apnea, unspecified: Secondary | ICD-10-CM | POA: Diagnosis not present

## 2015-07-17 DIAGNOSIS — E785 Hyperlipidemia, unspecified: Secondary | ICD-10-CM | POA: Diagnosis not present

## 2015-11-12 DIAGNOSIS — Z1231 Encounter for screening mammogram for malignant neoplasm of breast: Secondary | ICD-10-CM | POA: Diagnosis not present

## 2015-12-14 DIAGNOSIS — I10 Essential (primary) hypertension: Secondary | ICD-10-CM | POA: Diagnosis not present

## 2015-12-14 DIAGNOSIS — R7309 Other abnormal glucose: Secondary | ICD-10-CM | POA: Diagnosis not present

## 2015-12-14 DIAGNOSIS — E785 Hyperlipidemia, unspecified: Secondary | ICD-10-CM | POA: Diagnosis not present

## 2016-01-16 ENCOUNTER — Encounter (HOSPITAL_COMMUNITY): Payer: Self-pay | Admitting: Emergency Medicine

## 2016-01-16 ENCOUNTER — Emergency Department (HOSPITAL_COMMUNITY)
Admission: EM | Admit: 2016-01-16 | Discharge: 2016-01-16 | Disposition: A | Discharge status: Home / Self Care | Payer: Medicare Other | Attending: Emergency Medicine | Admitting: Emergency Medicine

## 2016-01-16 DIAGNOSIS — J069 Acute upper respiratory infection, unspecified: Secondary | ICD-10-CM | POA: Insufficient documentation

## 2016-01-16 DIAGNOSIS — Z79899 Other long term (current) drug therapy: Secondary | ICD-10-CM | POA: Insufficient documentation

## 2016-01-16 DIAGNOSIS — J45909 Unspecified asthma, uncomplicated: Secondary | ICD-10-CM | POA: Diagnosis not present

## 2016-01-16 DIAGNOSIS — R062 Wheezing: Secondary | ICD-10-CM | POA: Diagnosis not present

## 2016-01-16 DIAGNOSIS — I10 Essential (primary) hypertension: Secondary | ICD-10-CM | POA: Diagnosis not present

## 2016-01-16 DIAGNOSIS — R05 Cough: Secondary | ICD-10-CM | POA: Diagnosis present

## 2016-01-16 MED ORDER — MONTELUKAST SODIUM 10 MG PO TABS: 10.0000 mg | ORAL_TABLET | Freq: Every day | ORAL | 0 refills | Status: DC

## 2016-01-16 MED ORDER — PREDNISONE 50 MG PO TABS
60.0000 mg | ORAL_TABLET | Freq: Once | ORAL | Status: AC
  Administered 2016-01-16: 60 mg via ORAL
  Filled 2016-01-16: qty 1

## 2016-01-16 MED ORDER — PREDNISONE 10 MG PO TABS: ORAL_TABLET | ORAL | 0 refills | Status: DC

## 2016-01-16 NOTE — ED Triage Notes (Signed)
Patient complains of sinus drainage with cough x 2 days.

## 2016-01-17 NOTE — ED Provider Notes (Signed)
[facility] DEPT Provider Note   CSN: [id number] Arrival date & time: 01/16/16  1801     History   Chief Complaint Chief Complaint  Patient presents with  . Cough    HPI Sheri Fleming is a 65 y.o. female presenting with a 2 day history of uri type symptoms which includes nasal congestion with clear rhinorrhea, post nasal drip, mild sore throat and nonproductive cough with wheezing which she notes is more pronounced when trying to sleep.  Symptoms do not include shortness of breath, chest pain,  Nausea, vomiting or diarrhea.  The patient has taken no medicines prior to arrival.  She does state that her asthma used to be much better controlled when she was taking singulair.  The last time she tried to get this medicine filled, the pharmacy would not fill the medicine for unclear reasons per the patient.  Her pcp is Dr.Bland whom she has not seen recently.   The history is provided by the patient.    Past Medical History:  Diagnosis Date  . Asthma   . High cholesterol   . Hypertension   . Sleep apnea     There are no active problems to display for this patient.   Past Surgical History:  Procedure Laterality Date  . ABDOMINAL HYSTERECTOMY    . FRACTURE SURGERY      OB History    Gravida Para Term Preterm AB Living   4 4 4     4    SAB TAB Ectopic Multiple Live Births                   Home Medications    Prior to Admission medications   Medication Sig Start Date End Date Taking? Authorizing Provider  acetaminophen (TYLENOL) 500 MG tablet Take 500 mg by mouth every 6 (six) hours as needed for mild pain or moderate pain.    Historical Provider, MD  albuterol (PROVENTIL HFA;VENTOLIN HFA) 108 (90 BASE) MCG/ACT inhaler Inhale into the lungs every 6 (six) hours as needed for wheezing or shortness of breath.    Historical Provider, MD  famotidine (PEPCID) 20 MG tablet Take 1 tablet (20 mg total) by mouth 2 (two) times daily. 03/19/14   Nat Christen, MD    HYDROcodone-acetaminophen (NORCO) 5-325 MG per tablet Take 1-2 tablets by mouth every 4 (four) hours as needed. 03/19/14   Nat Christen, MD  lisinopril-hydrochlorothiazide (PRINZIDE,ZESTORETIC) 20-25 MG per tablet Take 1 tablet by mouth daily. 03/08/14   Tammy Triplett, PA-C  loratadine-pseudoephedrine (CLARITIN-D 12 HOUR) 5-120 MG tablet Take 1 tablet by mouth 2 (two) times daily. 11/14/14   Lily Kocher, PA-C  montelukast (SINGULAIR) 10 MG tablet Take 1 tablet (10 mg total) by mouth at bedtime. 01/16/16   Evalee Jefferson, PA-C  predniSONE (DELTASONE) 10 MG tablet 6, 5, 4, 3, 2 then 1 tablet by mouth daily for 6 days total. 01/16/16   Evalee Jefferson, PA-C  promethazine (PHENERGAN) 25 MG tablet Take 1 tablet (25 mg total) by mouth every 6 (six) hours as needed. 03/19/14   Nat Christen, MD  simvastatin (ZOCOR) 20 MG tablet Take 20 mg by mouth every evening.    Historical Provider, MD  traMADol (ULTRAM) 50 MG tablet Take 1 tablet (50 mg total) by mouth every 6 (six) hours as needed. 02/05/14   Evalee Jefferson, PA-C    Family History Family History  Problem Relation Age of Onset  . Stroke Mother   . Stroke Other  Social History Social History  Substance Use Topics  . Smoking status: Never Smoker  . Smokeless tobacco: Never Used  . Alcohol use No     Allergies   Patient has no known allergies.   Review of Systems Review of Systems  Constitutional: Negative for chills and fever.  HENT: Positive for congestion, postnasal drip, rhinorrhea and sore throat. Negative for ear pain, sinus pressure, trouble swallowing and voice change.   Eyes: Negative for discharge.  Respiratory: Positive for cough and wheezing. Negative for shortness of breath and stridor.   Cardiovascular: Negative for chest pain.  Gastrointestinal: Negative for abdominal pain.  Genitourinary: Negative.      Physical Exam Updated Vital Signs BP 155/93 (BP Location: Left Arm)   Pulse 90   Temp 99.1 F (37.3 C) (Oral)   Resp 17    Ht 5\' 2"  (1.575 m)   Wt 77.1 kg   SpO2 100%   BMI 31.09 kg/m   Physical Exam  Constitutional: She is oriented to person, place, and time. She appears well-developed and well-nourished.  HENT:  Head: Normocephalic and atraumatic.  Right Ear: Tympanic membrane and ear canal normal.  Left Ear: Tympanic membrane and ear canal normal.  Nose: Mucosal edema and rhinorrhea present.  Mouth/Throat: Uvula is midline, oropharynx is clear and moist and mucous membranes are normal. No oropharyngeal exudate, posterior oropharyngeal edema, posterior oropharyngeal erythema or tonsillar abscesses.  Eyes: Conjunctivae are normal.  Cardiovascular: Normal rate and normal heart sounds.   Pulmonary/Chest: Effort normal. No respiratory distress. She has no decreased breath sounds. She has no wheezes. She has no rales.  Lungs ctab.  Abdominal: Soft. There is no tenderness.  Musculoskeletal: Normal range of motion.  Neurological: She is alert and oriented to person, place, and time.  Skin: Skin is warm and dry. No rash noted.  Psychiatric: She has a normal mood and affect.     ED Treatments / Results  Labs (all labs ordered are listed, but only abnormal results are displayed) Labs Reviewed - No data to display  EKG  EKG Interpretation None       Radiology No results found.  Procedures Procedures (including critical care time)  Medications Ordered in ED Medications  predniSONE (DELTASONE) tablet 60 mg (60 mg Oral Given 01/16/16 2046)     Initial Impression / Assessment and Plan / ED Course  I have reviewed the triage vital signs and the nursing notes.  Pertinent labs & imaging results that were available during my care of the patient were reviewed by me and considered in my medical decision making (see chart for details).  Clinical Course     Pt with uri sx and wheezing per pt report, not currently on exam.  No respiratory distress during ed visit.  She was placed on a short prednisone  taper and also given a refill of her singulair.  Advised f/u here for any worsened sx, and f/u with pcp for further management/med refills prn.  The patient appears reasonably screened and/or stabilized for discharge and I doubt any other medical condition or other [facility] requiring further screening, evaluation, or treatment in the ED at this time prior to discharge.   Final Clinical Impressions(s) / ED Diagnoses   Final diagnoses:  Acute upper respiratory infection  Wheezing    New Prescriptions Discharge Medication List as of 01/16/2016  8:39 PM       Evalee Jefferson, PA-C 01/17/16 1249    Milton Ferguson, MD 01/21/16 1304

## 2016-04-22 DIAGNOSIS — R7309 Other abnormal glucose: Secondary | ICD-10-CM | POA: Diagnosis not present

## 2016-04-22 DIAGNOSIS — E785 Hyperlipidemia, unspecified: Secondary | ICD-10-CM | POA: Diagnosis not present

## 2016-05-31 DIAGNOSIS — I10 Essential (primary) hypertension: Secondary | ICD-10-CM | POA: Diagnosis not present

## 2016-05-31 DIAGNOSIS — E785 Hyperlipidemia, unspecified: Secondary | ICD-10-CM | POA: Diagnosis not present

## 2016-06-26 DIAGNOSIS — M8589 Other specified disorders of bone density and structure, multiple sites: Secondary | ICD-10-CM | POA: Diagnosis not present

## 2016-09-08 ENCOUNTER — Encounter (HOSPITAL_COMMUNITY): Payer: Self-pay | Admitting: Emergency Medicine

## 2016-09-08 ENCOUNTER — Emergency Department (HOSPITAL_COMMUNITY)
Admission: EM | Admit: 2016-09-08 | Discharge: 2016-09-08 | Disposition: A | Discharge status: Home / Self Care | Payer: Medicare Other | Attending: Emergency Medicine | Admitting: Emergency Medicine

## 2016-09-08 DIAGNOSIS — J45909 Unspecified asthma, uncomplicated: Secondary | ICD-10-CM | POA: Diagnosis not present

## 2016-09-08 DIAGNOSIS — R21 Rash and other nonspecific skin eruption: Secondary | ICD-10-CM | POA: Insufficient documentation

## 2016-09-08 DIAGNOSIS — I10 Essential (primary) hypertension: Secondary | ICD-10-CM | POA: Insufficient documentation

## 2016-09-08 NOTE — Discharge Instructions (Signed)
Use hydrocortisone cream 1% on the rash 3 timers daily . You can also take benadryl 50 mg 4 times daily as needed for itch. See Dr. Criss Rosales in the office if not improving in 2 or 3 days. Return for difficult breathing, swallowing or if you feel worse for any reason

## 2016-09-08 NOTE — ED Provider Notes (Signed)
[facility] DEPT Provider Note   CSN: [id number] Arrival date & time: 09/08/16  1434     History   Chief Complaint Chief Complaint  Patient presents with  . Rash    HPI ODALY PERI is a 66 y.o. female.   Complains of pruritic rash on right forearm which started 2 days ago. Rash is nonpainful. Located to right forearm only. No associated symptoms. No shortness of breath no difficulty swallowing no fever. Patient does not feel ill. Nothing makes symptoms better or worse. No treatment prior to coming here  HPI  Past Medical History:  Diagnosis Date  . Asthma   . High cholesterol   . Hypertension   . Sleep apnea     There are no active problems to display for this patient.   Past Surgical History:  Procedure Laterality Date  . ABDOMINAL HYSTERECTOMY    . FRACTURE SURGERY      OB History    Gravida Para Term Preterm AB Living   4 4 4     4    SAB TAB Ectopic Multiple Live Births                   Home Medications    Prior to Admission medications   Medication Sig Start Date End Date Taking? Authorizing Provider  acetaminophen (TYLENOL) 500 MG tablet Take 500 mg by mouth every 6 (six) hours as needed for mild pain or moderate pain.    [provider]  albuterol (PROVENTIL HFA;VENTOLIN HFA) 108 (90 BASE) MCG/ACT inhaler Inhale into the lungs every 6 (six) hours as needed for wheezing or shortness of breath.    [provider]  famotidine (PEPCID) 20 MG tablet Take 1 tablet (20 mg total) by mouth 2 (two) times daily. 03/19/14   Nat Christen, MD  HYDROcodone-acetaminophen [facility]) 5-325 MG per tablet Take 1-2 tablets by mouth every 4 (four) hours as needed. 03/19/14   Nat Christen, MD  lisinopril-hydrochlorothiazide (PRINZIDE,ZESTORETIC) 20-25 MG per tablet Take 1 tablet by mouth daily. 03/08/14   Triplett, Tammy, PA-C  loratadine-pseudoephedrine (CLARITIN-D 12 HOUR) 5-120 MG tablet Take 1 tablet by mouth 2 (two) times daily. 11/14/14   Lily Kocher,  PA-C  montelukast (SINGULAIR) 10 MG tablet Take 1 tablet (10 mg total) by mouth at bedtime. 01/16/16   Evalee Jefferson, PA-C  predniSONE (DELTASONE) 10 MG tablet 6, 5, 4, 3, 2 then 1 tablet by mouth daily for 6 days total. 01/16/16   Idol, Almyra Free, PA-C  promethazine (PHENERGAN) 25 MG tablet Take 1 tablet (25 mg total) by mouth every 6 (six) hours as needed. 03/19/14   Nat Christen, MD  simvastatin (ZOCOR) 20 MG tablet Take 20 mg by mouth every evening.    [provider]  traMADol (ULTRAM) 50 MG tablet Take 1 tablet (50 mg total) by mouth every 6 (six) hours as needed. 02/05/14   Evalee Jefferson, PA-C    Family History Family History  Problem Relation Age of Onset  . Stroke Mother   . Stroke Other     Social History Social History  Substance Use Topics  . Smoking status: Never Smoker  . Smokeless tobacco: Never Used  . Alcohol use No     Allergies   Patient has no known allergies.   Review of Systems Review of Systems  HENT: Negative for trouble swallowing.   Respiratory: Negative for shortness of breath.   Skin: Positive for rash.  All other systems reviewed and are negative.  Physical Exam Updated Vital Signs BP 127/68 (BP Location: Right Arm)   Pulse 81   Temp 98 F (36.7 C) (Oral)   Resp 16   Ht 5\' 3"  (1.6 m)   Wt 77.1 kg (170 lb)   SpO2 100%   BMI 30.11 kg/m   Physical Exam  Constitutional: She appears well-developed and well-nourished. No distress.  HENT:  Head: Normocephalic and atraumatic.  No mucosal lesion  Eyes: Conjunctivae and EOM are normal.  Neck: Neck supple. No tracheal deviation present. No thyromegaly present.  Cardiovascular: Normal rate and regular rhythm.   No murmur heard. Pulmonary/Chest: Effort normal.  Abdominal: Soft. Bowel sounds are normal. She exhibits no distension. There is no tenderness.  Musculoskeletal: Normal range of motion. She exhibits no edema or tenderness.  Neurological: She is alert. Coordination normal.  Skin:  Skin is warm and dry. Capillary refill takes less than 2 seconds. Rash noted.  Hive-like rash and stocking glove fashion at right forearm. Does not extend the hand or to upper arm. Radial pulse 2+. Rash is not tender. It is Minimally pinkish. No other rash noted.  Psychiatric: She has a normal mood and affect.  Nursing note and vitals reviewed.    ED Treatments / Results  Labs (all labs ordered are listed, but only abnormal results are displayed) Labs Reviewed - No data to display  EKG  EKG Interpretation None       Radiology No results found.  Procedures Procedures (including critical care time)  Medications Ordered in ED Medications - No data to display suspect contact dermatitis Initial Impression / Assessment and Plan / ED Course  I have reviewed the triage vital signs and the nursing notes.  Pertinent labs & imaging results that were available during my care of the patient were reviewed by me and considered in my medical decision making (see chart for details).    Plan hydrocortisone cream. Benadryl. Referral doctor Dr. Criss Rosales, her PCP if not better in a few days. Return precautions given for shortness of breath difficult swallowing or if feels worse   Final Clinical Impressions(s) / ED Diagnoses  rash Final diagnoses:  Rash    New Prescriptions New Prescriptions   No medications on file     Orlie Dakin, MD 09/08/16 1539

## 2016-09-08 NOTE — ED Triage Notes (Signed)
Patient complains of rash and itching on left and right forearms x 2 days.

## 2016-09-08 NOTE — ED Notes (Signed)
Notice rash (bump-like) areas to left forearm about 2 days ago.  Then rash started to right arm.  Denies pain but c/o itching.  No redness noted.  Small raised areas noted to right>left.

## 2016-09-09 ENCOUNTER — Other Ambulatory Visit: Payer: Self-pay

## 2016-09-09 NOTE — Patient Outreach (Signed)
Outreach patient after ED visit on 09/04/16.  Patient verified PCP and has follow appointment already scheduled.  Patient states she ok.  Explained [facility] services to patient and patient made note of [facility] 24 hour Nurse advice line.  I advised patient that I will mail information regarding [facility] and number to contact us is within that information if she needs [place] in the future.

## 2016-09-17 DIAGNOSIS — J4520 Mild intermittent asthma, uncomplicated: Secondary | ICD-10-CM | POA: Diagnosis not present

## 2016-09-17 DIAGNOSIS — R7309 Other abnormal glucose: Secondary | ICD-10-CM | POA: Diagnosis not present

## 2016-09-17 DIAGNOSIS — I10 Essential (primary) hypertension: Secondary | ICD-10-CM | POA: Diagnosis not present

## 2016-09-17 DIAGNOSIS — R21 Rash and other nonspecific skin eruption: Secondary | ICD-10-CM | POA: Diagnosis not present

## 2016-09-17 DIAGNOSIS — G4730 Sleep apnea, unspecified: Secondary | ICD-10-CM | POA: Diagnosis not present

## 2016-12-23 ENCOUNTER — Emergency Department (HOSPITAL_COMMUNITY)
Admission: EM | Admit: 2016-12-23 | Discharge: 2016-12-23 | Disposition: A | Discharge status: Home / Self Care | Payer: Medicare Other | Attending: Emergency Medicine | Admitting: Emergency Medicine

## 2016-12-23 ENCOUNTER — Emergency Department (HOSPITAL_COMMUNITY): Payer: Medicare Other

## 2016-12-23 ENCOUNTER — Encounter (HOSPITAL_COMMUNITY): Payer: Self-pay | Admitting: *Deleted

## 2016-12-23 ENCOUNTER — Other Ambulatory Visit: Payer: Self-pay

## 2016-12-23 DIAGNOSIS — I10 Essential (primary) hypertension: Secondary | ICD-10-CM | POA: Insufficient documentation

## 2016-12-23 DIAGNOSIS — J45901 Unspecified asthma with (acute) exacerbation: Secondary | ICD-10-CM | POA: Diagnosis not present

## 2016-12-23 DIAGNOSIS — R05 Cough: Secondary | ICD-10-CM | POA: Insufficient documentation

## 2016-12-23 DIAGNOSIS — R0602 Shortness of breath: Secondary | ICD-10-CM | POA: Diagnosis present

## 2016-12-23 DIAGNOSIS — Z79899 Other long term (current) drug therapy: Secondary | ICD-10-CM | POA: Insufficient documentation

## 2016-12-23 DIAGNOSIS — J4541 Moderate persistent asthma with (acute) exacerbation: Secondary | ICD-10-CM | POA: Diagnosis not present

## 2016-12-23 MED ORDER — BENZONATATE 100 MG PO CAPS: 100.0000 mg | ORAL_CAPSULE | Freq: Three times a day (TID) | ORAL | 0 refills | Status: DC

## 2016-12-23 MED ORDER — IPRATROPIUM-ALBUTEROL 0.5-2.5 (3) MG/3ML IN SOLN
3.0000 mL | Freq: Once | RESPIRATORY_TRACT | Status: AC
  Administered 2016-12-23: 3 mL via RESPIRATORY_TRACT
  Filled 2016-12-23: qty 3

## 2016-12-23 MED ORDER — PREDNISONE 20 MG PO TABS: 40.0000 mg | ORAL_TABLET | Freq: Every day | ORAL | 0 refills | Status: AC

## 2016-12-23 MED ORDER — ALBUTEROL SULFATE HFA 108 (90 BASE) MCG/ACT IN AERS: 1.0000 | INHALATION_SPRAY | Freq: Four times a day (QID) | RESPIRATORY_TRACT | 0 refills | Status: DC | PRN

## 2016-12-23 MED ORDER — BENZONATATE 100 MG PO CAPS
100.0000 mg | ORAL_CAPSULE | Freq: Once | ORAL | Status: AC
  Administered 2016-12-23: 100 mg via ORAL
  Filled 2016-12-23: qty 1

## 2016-12-23 NOTE — ED Notes (Signed)
Pt states to be feeling better after breathing treatment

## 2016-12-23 NOTE — ED Provider Notes (Signed)
Emergency Department Provider Note   I have reviewed the triage vital signs and the nursing notes.   HISTORY  Chief Complaint Cough   HPI Sheri Fleming is a 66 y.o. female with PMH of HLD, HTN, and asthma presents to the ED for evaluation of URI symptoms with productive cough and SOB.  The patient states that symptoms feel similar to her asthma exacerbations in the past.  She has tried treatment at home with inhalers but has had no relief in symptoms.  She denies chest pain.  Shortness of breath worse with coughing.  No fevers or chills.  No hemoptysis.  Denies vomiting or diarrhea.   Past Medical History:  Diagnosis Date  . Asthma   . High cholesterol   . Hypertension   . Sleep apnea     There are no active problems to display for this patient.   Past Surgical History:  Procedure Laterality Date  . ABDOMINAL HYSTERECTOMY    . FRACTURE SURGERY      Current Outpatient Rx  . Order #: [id number] Class: Historical Med  . Order #: [id number] Class: Print  . Order #: [id number] Class: Print  . Order #: [id number] Class: Historical Med  . Order #: [id number] Class: Print  . Order #: [id number] Class: Print  . Order #: [id number] Class: Print    Allergies Patient has no known allergies.  Family History  Problem Relation Age of Onset  . Stroke Mother   . Stroke Other     Social History Social History   Tobacco Use  . Smoking status: Never Smoker  . Smokeless tobacco: Never Used  Substance Use Topics  . Alcohol use: No  . Drug use: No    Review of Systems  Constitutional: No fever/chills Eyes: No visual changes. ENT: No sore throat. Cardiovascular: Denies chest pain. Respiratory: Positive shortness of breath and cough.  Gastrointestinal: No abdominal pain.  No nausea, no vomiting.  No diarrhea.  No constipation. Genitourinary: Negative for dysuria. Musculoskeletal: Negative for back pain. Skin: Negative for rash. Neurological: Negative for headaches, focal  weakness or numbness.  10-point ROS otherwise negative.  ____________________________________________   PHYSICAL EXAM:  VITAL SIGNS: ED Triage Vitals [12/23/16 1841]  Enc Vitals Group     BP (!) 154/87     Pulse Rate 93     Resp 16     Temp 98.6 F (37 C)     Temp Source Oral     SpO2 100 %     Weight 170 lb (77.1 kg)     Height 5\' 1"  (1.549 m)    Constitutional: Alert and oriented. Well appearing and in no acute distress. Eyes: Conjunctivae are normal.  Head: Atraumatic. Nose: No congestion/rhinnorhea. Mouth/Throat: Mucous membranes are moist.  Neck: No stridor.  Cardiovascular: Normal rate, regular rhythm. Good peripheral circulation. Grossly normal heart sounds.   Respiratory: Normal respiratory effort.  No retractions. Lungs with faint end-expiratory wheezing.  Gastrointestinal: Soft and nontender. No distention.  Musculoskeletal: No lower extremity tenderness nor edema. No gross deformities of extremities. Neurologic:  Normal speech and language. No gross focal neurologic deficits are appreciated.  Skin:  Skin is warm, dry and intact. No rash noted. ____________________________________________  EKG   EKG Interpretation  Date/Time:  Tuesday December 23 2016 20:15:55 EST Ventricular Rate:  91 PR Interval:    QRS Duration: 91 QT Interval:  369 QTC Calculation: 454 R Axis:   63 Text Interpretation:  Sinus rhythm Low voltage, precordial leads No STEMI.  Confirmed by Nanda Quinton (  [id number]) on 12/23/2016 8:27:34 PM       ____________________________________________  RADIOLOGY  Dg Chest 2 View  Result Date: 12/23/2016 CLINICAL DATA:  Productive cough for 2 days EXAM: CHEST  2 VIEW COMPARISON:  06/14/2014 FINDINGS: Cardiac shadow is within normal limits. The lungs are well aerated bilaterally. No focal infiltrate or sizable effusion is seen. Degenerative changes of the thoracic spine are noted. IMPRESSION: No active cardiopulmonary disease. Electronically Signed    By: Inez Catalina M.D.   On: 12/23/2016 20:43    ____________________________________________   PROCEDURES  Procedure(s) performed:   Procedures  None ____________________________________________   INITIAL IMPRESSION / ASSESSMENT AND PLAN / ED COURSE  Pertinent labs & imaging results that were available during my care of the patient were reviewed by me and considered in my medical decision making (see chart for details).  Patient presents to the emergency department for evaluation of shortness of breath and productive cough.  She has a history of asthma.  No chest pain.  EKG from triage unremarkable.  Chest x-ray with no acute infiltrate.  Plan for DuoNeb, steroids, reassess and likely discharge home.  Patient feeling better after neb. Will start steroid and albuterol. Also discharge with Tessalon. Suspect viral URI with asthma flare.   At this time, I do not feel there is any life-threatening condition present. I have reviewed and discussed all results (EKG, imaging, lab, urine as appropriate), exam findings with patient. I have reviewed nursing notes and appropriate previous records.  I feel the patient is safe to be discharged home without further emergent workup. Discussed usual and customary return precautions. Patient and family (if present) verbalize understanding and are comfortable with this plan.  Patient will follow-up with their primary care provider. If they do not have a primary care provider, information for follow-up has been provided to them. All questions have been answered.  ____________________________________________  FINAL CLINICAL IMPRESSION(S) / ED DIAGNOSES  Final diagnoses:  Moderate asthma with exacerbation, unspecified whether persistent     MEDICATIONS GIVEN DURING THIS VISIT:  Medications  ipratropium-albuterol (DUONEB) 0.5-2.5 (3) MG/3ML nebulizer solution 3 mL (3 mLs Nebulization Given 12/23/16 2051)  benzonatate (TESSALON) capsule 100 mg (100  mg Oral Given 12/23/16 2226)     NEW OUTPATIENT MEDICATIONS STARTED DURING THIS VISIT:  Tessalon, Albuterol, and Prednisone   Note:  This document was prepared using Dragon voice recognition software and may include unintentional dictation errors.  Nanda Quinton, MD Emergency Medicine    Long, [facility], MD 12/24/16 1009

## 2016-12-23 NOTE — ED Notes (Signed)
Pt to xray

## 2016-12-23 NOTE — ED Notes (Signed)
Was going to start IV and draw blood work on pt. But she is refusing blood work. States, "They usually give me PO meds when this happens. I hate needles."

## 2016-12-23 NOTE — Discharge Instructions (Signed)
We believe that your symptoms are caused today by an exacerbation of your asthma.  Please take the prescribed medications and any medications that you have at home.  Follow up with your doctor as recommended.  If you develop any new or worsening symptoms, including but not limited to fever, persistent vomiting, worsening shortness of breath, or other symptoms that concern you, please return to the Emergency Department immediately. ° ° °Asthma °Asthma is a recurring condition in which the airways tighten and narrow. Asthma can make it difficult to breathe. It can cause coughing, wheezing, and shortness of breath. Asthma episodes, also called asthma attacks, range from minor to life-threatening. Asthma cannot be cured, but medicines and lifestyle changes can help control it. °CAUSES °Asthma is believed to be caused by inherited (genetic) and environmental factors, but its exact cause is unknown. Asthma may be triggered by allergens, lung infections, or irritants in the air. Asthma triggers are different for each person. Common triggers include:  °Animal dander. °Dust mites. °Cockroaches. °Pollen from trees or grass. °Mold. °Smoke. °Air pollutants such as dust, household cleaners, hair sprays, aerosol sprays, paint fumes, strong chemicals, or strong odors. °Cold air, weather changes, and winds (which increase molds and pollens in the air). °Strong emotional expressions such as crying or laughing hard. °Stress. °Certain medicines (such as aspirin) or types of drugs (such as beta-blockers). °Sulfites in foods and drinks. Foods and drinks that may contain sulfites include dried fruit, potato chips, and sparkling grape juice. °Infections or inflammatory conditions such as the flu, a cold, or an inflammation of the nasal membranes (rhinitis). °Gastroesophageal reflux disease (GERD). °Exercise or strenuous activity. °SYMPTOMS °Symptoms may occur immediately after asthma is triggered or many hours later. Symptoms  include: °Wheezing. °Excessive nighttime or early morning coughing. °Frequent or severe coughing with a common cold. °Chest tightness. °Shortness of breath. °DIAGNOSIS  °The diagnosis of asthma is made by a review of your medical history and a physical exam. Tests may also be performed. These may include: °Lung function studies. These tests show how much air you breathe in and out. °Allergy tests. °Imaging tests such as X-rays. °TREATMENT  °Asthma cannot be cured, but it can usually be controlled. Treatment involves identifying and avoiding your asthma triggers. It also involves medicines. There are 2 classes of medicine used for asthma treatment:  °Controller medicines. These prevent asthma symptoms from occurring. They are usually taken every day. °Reliever or rescue medicines. These quickly relieve asthma symptoms. They are used as needed and provide short-term relief. °Your health care provider will help you create an asthma action plan. An asthma action plan is a written plan for managing and treating your asthma attacks. It includes a list of your asthma triggers and how they may be avoided. It also includes information on when medicines should be taken and when their dosage should be changed. An action plan may also involve the use of a device called a peak flow meter. A peak flow meter measures how well the lungs are working. It helps you monitor your condition. °HOME CARE INSTRUCTIONS  °Take medicines only as directed by your health care provider. Speak with your health care provider if you have questions about how or when to take the medicines. °Use a peak flow meter as directed by your health care provider. Record and keep track of readings. °Understand and use the action plan to help minimize or stop an asthma attack without needing to seek medical care. °Control your home environment in the following   ways to help prevent asthma attacks: °Do not smoke. Avoid being exposed to secondhand smoke. °Change  your heating and air conditioning filter regularly. °Limit your use of fireplaces and wood stoves. °Get rid of pests (such as roaches and mice) and their droppings. °Throw away plants if you see mold on them. °Clean your floors and dust regularly. Use unscented cleaning products. °Try to have someone else vacuum for you regularly. Stay out of rooms while they are being vacuumed and for a short while afterward. If you vacuum, use a dust mask from a hardware store, a double-layered or microfilter vacuum cleaner bag, or a vacuum cleaner with a HEPA filter. °Replace carpet with wood, tile, or vinyl flooring. Carpet can trap dander and dust. °Use allergy-proof pillows, mattress covers, and box spring covers. °Wash bed sheets and blankets every week in hot water and dry them in a dryer. °Use blankets that are made of polyester or cotton. °Clean bathrooms and kitchens with bleach. If possible, have someone repaint the walls in these rooms with mold-resistant paint. Keep out of the rooms that are being cleaned and painted. °Wash hands frequently. °SEEK MEDICAL CARE IF:  °You have wheezing, shortness of breath, or a cough even if taking medicine to prevent attacks. °The colored mucus you cough up (sputum) is thicker than usual. °Your sputum changes from clear or white to yellow, green, gray, or bloody. °You have any problems that may be related to the medicines you are taking (such as a rash, itching, swelling, or trouble breathing). °You are using a reliever medicine more than 2-3 times per week. °Your peak flow is still at 50-79% of your personal best after following your action plan for 1 hour. °You have a fever. °SEEK IMMEDIATE MEDICAL CARE IF:  °You seem to be getting worse and are unresponsive to treatment during an asthma attack. °You are short of breath even at rest. °You get short of breath when doing very little physical activity. °You have difficulty eating, drinking, or talking due to asthma symptoms. °You  develop chest pain. °You develop a fast heartbeat. °You have a bluish color to your lips or fingernails. °You are light-headed, dizzy, or faint. °Your peak flow is less than 50% of your personal best. °MAKE SURE YOU:  °Understand these instructions. °Will watch your condition. °Will get help right away if you are not doing well or get worse. °Document Released: 01/20/2005 Document Revised: 06/06/2013 Document Reviewed: 08/19/2012 °[employer]® Patient Information ©2015 [employer]. This information is not intended to replace advice given to you by your health care provider. Make sure you discuss any questions you have with your health care provider. ° °How to Use a Nebulizer °If you have asthma or other breathing problems, you might need to breathe in (inhale) medicine. This can be done with a nebulizer. A nebulizer is a device that turns liquid medicine into a mist that you can inhale.  °There are different kinds of nebulizers. Most are small. With some, you breathe in through a mouthpiece. With others, a mask fits over your nose and mouth. Most nebulizers must be connected to a small air compressor. Air is forced through tubing from the compressor to the nebulizer. The forced air changes the liquid into a fine spray. °RISKS AND COMPLICATIONS °The nebulizer must work properly for it to help your breathing. If the nebulizer does not produce mist, or if foam comes out, this indicates that the nebulizer is not working properly. Sometimes a filter can get clogged, or   there might be a problem with the air compressor. Check the instruction booklet that came with your nebulizer. It should tell you how to fix problems or where to call for help. You should have at least one extra nebulizer at home. That way, you will always have one when you need it.  °HOW TO PREPARE BEFORE USING THE NEBULIZER °Take these steps before using the nebulizer: °Check your medicine. Make sure it has not expired and is not damaged in any way.    °Wash your hands with soap and water.   °Put all the parts of your nebulizer on a sturdy, flat surface. Make sure the tubing connects the compressor and the nebulizer. °Measure the liquid medicine according to your health care provider's instructions. Pour it into the nebulizer. °Attach the mouthpiece or mask.   °Test the nebulizer by turning it on to make sure a spray is coming out. Then, turn it off.   °HOW TO USE THE NEBULIZER °Sit down and focus on staying relaxed.   °If your nebulizer has a mask, put it over your nose and mouth. If you use a mouthpiece, put it in your mouth. Press your lips firmly around the mouthpiece. °Turn on the nebulizer.   °Breathe out.   °Some nebulizers have a finger valve. If yours does, cover up the air hole so the air gets to the nebulizer. °Once the medicine begins to mist out, take slow, deep breaths. If there is a finger valve, release it at the end of your breath. °Continue taking slow, deep breaths until the nebulizer is empty.   °Be sure to stop the machine at any point if you start coughing or if the medicine foams or bubbles. °HOW TO CLEAN THE NEBULIZER  °The nebulizer and all its parts must be kept very clean. Follow the manufacturer's instructions for cleaning. For most nebulizers, you should follow these guidelines: °Wash the nebulizer after each use. Use warm water and soap. Rinse it well. Shake the nebulizer to remove extra water. Put it on a clean towel until it is completely dry. To make sure it is dry, put the nebulizer back together. Turn on the compressor for a few minutes. This will blow air through the nebulizer.   °Do not wash the tubing or the finger valve.   °Store the nebulizer in a dust-free place.   °Inspect the filter every week. Replace it any time it looks dirty.   °Sometimes the nebulizer will need a more complete cleaning. The instruction booklet should say how often you need to do this. °SEEK MEDICAL CARE IF:  °You continue to have difficulty  breathing.   °You have trouble using the nebulizer.   °Document Released: 01/08/2009 Document Revised: 06/06/2013 Document Reviewed: 07/12/2012 °ExitCare® Patient Information ©2015 [employer]. This information is not intended to replace advice given to you by your health care provider. Make sure you discuss any questions you have with your health care provider. ° °How to Use an Inhaler °Proper inhaler technique is very important. Good technique ensures that the medicine reaches the lungs. Poor technique results in depositing the medicine on the tongue and back of the throat rather than in the airways. If you do not use the inhaler with good technique, the medicine will not help you. °STEPS TO FOLLOW IF USING AN INHALER WITHOUT AN EXTENSION TUBE °Remove the cap from the inhaler. °If you are using the inhaler for the first time, you will need to prime it. Shake the inhaler for   5 seconds and release four puffs into the air, away from your face. Ask your health care provider or pharmacist if you have questions about priming your inhaler. °Shake the inhaler for 5 seconds before each breath in (inhalation). °Position the inhaler so that the top of the canister faces up. °Put your index finger on the top of the medicine canister. Your thumb supports the bottom of the inhaler. °Open your mouth. °Either place the inhaler between your teeth and place your lips tightly around the mouthpiece, or hold the inhaler 1-2 inches away from your open mouth. If you are unsure of which technique to use, ask your health care provider. °Breathe out (exhale) normally and as completely as possible. °Press the canister down with your index finger to release the medicine. °At the same time as the canister is pressed, inhale deeply and slowly until your lungs are completely filled. This should take 4-6 seconds. Keep your tongue down. °Hold the medicine in your lungs for 5-10 seconds (10 seconds is best). This helps the medicine get into the  small airways of your lungs. °Breathe out slowly, through pursed lips. Whistling is an example of pursed lips. °Wait at least 15-30 seconds between puffs. Continue with the above steps until you have taken the number of puffs your health care provider has ordered. Do not use the inhaler more than your health care provider tells you. °Replace the cap on the inhaler. °Follow the directions from your health care provider or the inhaler insert for cleaning the inhaler. °STEPS TO FOLLOW IF USING AN INHALER WITH AN EXTENSION (SPACER) °Remove the cap from the inhaler. °If you are using the inhaler for the first time, you will need to prime it. Shake the inhaler for 5 seconds and release four puffs into the air, away from your face. Ask your health care provider or pharmacist if you have questions about priming your inhaler. °Shake the inhaler for 5 seconds before each breath in (inhalation). °Place the open end of the spacer onto the mouthpiece of the inhaler. °Position the inhaler so that the top of the canister faces up and the spacer mouthpiece faces you. °Put your index finger on the top of the medicine canister. Your thumb supports the bottom of the inhaler and the spacer. °Breathe out (exhale) normally and as completely as possible. °Immediately after exhaling, place the spacer between your teeth and into your mouth. Close your lips tightly around the spacer. °Press the canister down with your index finger to release the medicine. °At the same time as the canister is pressed, inhale deeply and slowly until your lungs are completely filled. This should take 4-6 seconds. Keep your tongue down and out of the way. °Hold the medicine in your lungs for 5-10 seconds (10 seconds is best). This helps the medicine get into the small airways of your lungs. Exhale. °Repeat inhaling deeply through the spacer mouthpiece. Again hold that breath for up to 10 seconds (10 seconds is best). Exhale slowly. If it is difficult to take  this second deep breath through the spacer, breathe normally several times through the spacer. Remove the spacer from your mouth. °Wait at least 15-30 seconds between puffs. Continue with the above steps until you have taken the number of puffs your health care provider has ordered. Do not use the inhaler more than your health care provider tells you. °Remove the spacer from the inhaler, and place the cap on the inhaler. °Follow the directions from your health care provider   or the inhaler insert for cleaning the inhaler and spacer. °If you are using different kinds of inhalers, use your quick relief medicine to open the airways 10-15 minutes before using a steroid if instructed to do so by your health care provider. If you are unsure which inhalers to use and the order of using them, ask your health care provider, nurse, or respiratory therapist. °If you are using a steroid inhaler, always rinse your mouth with water after your last puff, then gargle and spit out the water. Do not swallow the water. °AVOID: °Inhaling before or after starting the spray of medicine. It takes practice to coordinate your breathing with triggering the spray. °Inhaling through the nose (rather than the mouth) when triggering the spray. °HOW TO DETERMINE IF YOUR INHALER IS FULL OR NEARLY EMPTY °You cannot know when an inhaler is empty by shaking it. A few inhalers are now being made with dose counters. Ask your health care provider for a prescription that has a dose counter if you feel you need that extra help. If your inhaler does not have a counter, ask your health care provider to help you determine the date you need to refill your inhaler. Write the refill date on a calendar or your inhaler canister. Refill your inhaler 7-10 days before it runs out. Be sure to keep an adequate supply of medicine. This includes making sure it is not expired, and that you have a spare inhaler.  °SEEK MEDICAL CARE IF:  °Your symptoms are only partially  relieved with your inhaler. °You are having trouble using your inhaler. °You have some increase in phlegm. °SEEK IMMEDIATE MEDICAL CARE IF:  °You feel little or no relief with your inhalers. You are still wheezing and are feeling shortness of breath or tightness in your chest or both. °You have dizziness, headaches, or a fast heart rate. °You have chills, fever, or night sweats. °You have a noticeable increase in phlegm production, or there is blood in the phlegm. °MAKE SURE YOU:  °Understand these instructions. °Will watch your condition. °Will get help right away if you are not doing well or get worse. °Document Released: 01/18/2000 Document Revised: 11/10/2012 Document Reviewed: 08/19/2012 °Exit[employer]® Patient Information ©2015 [employer]. This information is not intended to replace advice given to you by your health care provider. Make sure you discuss any questions you have with your health care provider. ° ° ° °

## 2016-12-23 NOTE — ED Notes (Signed)
Have paged respiratory  

## 2016-12-23 NOTE — ED Triage Notes (Signed)
Pt c/o yellow productive cough and nasal congestion that started last night. Pt reports she has used her inhalers at home with no relief.

## 2017-02-27 DIAGNOSIS — Z1231 Encounter for screening mammogram for malignant neoplasm of breast: Secondary | ICD-10-CM | POA: Diagnosis not present

## 2017-02-28 DIAGNOSIS — R7309 Other abnormal glucose: Secondary | ICD-10-CM | POA: Diagnosis not present

## 2017-02-28 DIAGNOSIS — G4730 Sleep apnea, unspecified: Secondary | ICD-10-CM | POA: Diagnosis not present

## 2017-02-28 DIAGNOSIS — I10 Essential (primary) hypertension: Secondary | ICD-10-CM | POA: Diagnosis not present

## 2017-02-28 DIAGNOSIS — E785 Hyperlipidemia, unspecified: Secondary | ICD-10-CM | POA: Diagnosis not present

## 2017-03-10 ENCOUNTER — Emergency Department (HOSPITAL_COMMUNITY)
Admission: EM | Admit: 2017-03-10 | Discharge: 2017-03-10 | Disposition: A | Discharge status: Home / Self Care | Payer: Medicare Other | Attending: Emergency Medicine | Admitting: Emergency Medicine

## 2017-03-10 ENCOUNTER — Encounter (HOSPITAL_COMMUNITY): Payer: Self-pay | Admitting: Emergency Medicine

## 2017-03-10 ENCOUNTER — Emergency Department (HOSPITAL_COMMUNITY): Payer: Medicare Other

## 2017-03-10 DIAGNOSIS — I10 Essential (primary) hypertension: Secondary | ICD-10-CM | POA: Diagnosis not present

## 2017-03-10 DIAGNOSIS — J4541 Moderate persistent asthma with (acute) exacerbation: Secondary | ICD-10-CM | POA: Diagnosis not present

## 2017-03-10 DIAGNOSIS — J45901 Unspecified asthma with (acute) exacerbation: Secondary | ICD-10-CM | POA: Diagnosis not present

## 2017-03-10 DIAGNOSIS — R05 Cough: Secondary | ICD-10-CM | POA: Diagnosis not present

## 2017-03-10 DIAGNOSIS — Z79899 Other long term (current) drug therapy: Secondary | ICD-10-CM | POA: Diagnosis not present

## 2017-03-10 MED ORDER — PREDNISONE 10 MG PO TABS
ORAL_TABLET | ORAL | Status: AC
  Filled 2017-03-10: qty 1

## 2017-03-10 MED ORDER — PREDNISONE 50 MG PO TABS
60.0000 mg | ORAL_TABLET | Freq: Every day | ORAL | Status: DC
  Administered 2017-03-10: 60 mg via ORAL

## 2017-03-10 MED ORDER — PREDNISONE 50 MG PO TABS
ORAL_TABLET | ORAL | Status: AC
  Filled 2017-03-10: qty 1

## 2017-03-10 MED ORDER — PREDNISONE 10 MG (21) PO TBPK: ORAL_TABLET | ORAL | 0 refills | Status: DC

## 2017-03-10 NOTE — ED Triage Notes (Signed)
Pt reports being out of her nebulizer and having a productive cough with yellow mucous.  No wheezing noted.

## 2017-03-10 NOTE — ED Provider Notes (Signed)
[facility] EMERGENCY DEPARTMENT Provider Note   CSN: [id number] Arrival date & time: 03/10/17  1653     History   Chief Complaint Chief Complaint  Patient presents with  . Cough    HPI Sheri Fleming is a 67 y.o. female.  The history is provided by the patient. No language interpreter was used.  Cough  This is a new problem. The problem occurs constantly. The problem has been gradually worsening. The cough is non-productive. There has been no fever. She has tried nothing for the symptoms. She is not a smoker. Her past medical history is significant for asthma.   Pt reports she is having a flare up of her asthma.  Pt is taking her singular and using her inhaler.  Pt has had to be on albuterol in the past.  Past Medical History:  Diagnosis Date  . Asthma   . High cholesterol   . Hypertension   . Sleep apnea     There are no active problems to display for this patient.   Past Surgical History:  Procedure Laterality Date  . ABDOMINAL HYSTERECTOMY    . FRACTURE SURGERY      OB History    Gravida Para Term Preterm AB Living   4 4 4     4    SAB TAB Ectopic Multiple Live Births                   Home Medications    Prior to Admission medications   Medication Sig Start Date End Date Taking? Authorizing Provider  albuterol (PROVENTIL HFA;VENTOLIN HFA) 108 (90 Base) MCG/ACT inhaler Inhale 1-2 puffs into the lungs every 6 (six) hours as needed for wheezing or shortness of breath. 12/23/16   LongWonda Olds, MD  alendronate (FOSAMAX) 70 MG tablet Take one tablet by mouth once every Thursday 10/22/16   [provider]  benzonatate (TESSALON) 100 MG capsule Take 1 capsule (100 mg total) by mouth every 8 (eight) hours. 12/23/16   Long, Wonda Olds, MD  lisinopril-hydrochlorothiazide (PRINZIDE,ZESTORETIC) 20-25 MG per tablet Take 1 tablet by mouth daily. 03/08/14   Triplett, Tammy, PA-C  montelukast (SINGULAIR) 10 MG tablet Take 1 tablet (10 mg total) by mouth at bedtime.  01/16/16   Evalee Jefferson, PA-C  predniSONE (STERAPRED UNI-PAK 21 TAB) 10 MG (21) TBPK tablet 6,5,4,3,2,1 taper 03/10/17   Fransico Meadow, PA-C  simvastatin (ZOCOR) 20 MG tablet Take 20 mg by mouth every evening.    [provider]    Family History Family History  Problem Relation Age of Onset  . Stroke Mother   . Stroke Other     Social History Social History   Tobacco Use  . Smoking status: Never Smoker  . Smokeless tobacco: Never Used  Substance Use Topics  . Alcohol use: No  . Drug use: No     Allergies   Patient has no known allergies.   Review of Systems Review of Systems  Respiratory: Positive for cough.   All other systems reviewed and are negative.    Physical Exam Updated Vital Signs BP 137/71 (BP Location: Left Arm)   Pulse 97   Temp 99.5 F (37.5 C) (Oral)   Resp 18   Ht 5\' 3"  (1.6 m)   Wt 77.1 kg (170 lb)   SpO2 100%   BMI 30.11 kg/m   Physical Exam  Constitutional: She is oriented to person, place, and time. She appears well-developed and well-nourished.  HENT:  Head: Normocephalic.  Eyes: EOM are normal.  Neck: Normal range of motion.  Cardiovascular: Normal rate and regular rhythm.  Pulmonary/Chest: Effort normal and breath sounds normal.  Abdominal: She exhibits no distension.  Musculoskeletal: Normal range of motion.  Neurological: She is alert and oriented to person, place, and time.  Psychiatric: She has a normal mood and affect.  Nursing note and vitals reviewed.    ED Treatments / Results  Labs (all labs ordered are listed, but only abnormal results are displayed) Labs Reviewed - No data to display  EKG  EKG Interpretation None       Radiology Dg Chest 2 View  Result Date: 03/10/2017 CLINICAL DATA:  Productive cough EXAM: CHEST  2 VIEW COMPARISON:  12/23/2016 FINDINGS: Elevation of the left diaphragm. No focal pulmonary infiltrate, consolidation or effusion. Normal cardiomediastinal silhouette. No pneumothorax.  Degenerative changes of the spine. IMPRESSION: No active cardiopulmonary disease. Electronically Signed   By: Donavan Foil M.D.   On: 03/10/2017 17:59    Procedures Procedures (including critical care time)  Medications Ordered in ED Medications  predniSONE (DELTASONE) tablet 60 mg (not administered)  predniSONE (DELTASONE) 50 MG tablet (not administered)  predniSONE (DELTASONE) 10 MG tablet (not administered)     Initial Impression / Assessment and Plan / ED Course  I have reviewed the triage vital signs and the nursing notes.  Pertinent labs & imaging results that were available during my care of the patient were reviewed by me and considered in my medical decision making (see chart for details).     Scheduled Meds: . predniSONE      . predniSONE      . [START ON 03/11/2017] predniSONE  60 mg Oral Q breakfast   Continuous Infusions: PRN Meds:.  MDM  Pt's chest xray is normal.  She is not wheezing.   Pt is advised to continue albuterol.  Pt given rx for prednisone taper.   Final Clinical Impressions(s) / ED Diagnoses   Final diagnoses:  Moderate asthma with exacerbation, unspecified whether persistent    ED Discharge Orders        Ordered    predniSONE (STERAPRED UNI-PAK 21 TAB) 10 MG (21) TBPK tablet     03/10/17 1940    An After Visit Summary was printed and given to the patient.    Fransico Meadow, PA-C 03/10/17 [place], [place], DO 03/11/17 1711

## 2017-03-11 ENCOUNTER — Other Ambulatory Visit: Payer: Self-pay

## 2017-07-04 DIAGNOSIS — G4730 Sleep apnea, unspecified: Secondary | ICD-10-CM | POA: Diagnosis not present

## 2017-07-04 DIAGNOSIS — I10 Essential (primary) hypertension: Secondary | ICD-10-CM | POA: Diagnosis not present

## 2017-07-04 DIAGNOSIS — J4520 Mild intermittent asthma, uncomplicated: Secondary | ICD-10-CM | POA: Diagnosis not present

## 2017-07-04 DIAGNOSIS — E782 Mixed hyperlipidemia: Secondary | ICD-10-CM | POA: Diagnosis not present

## 2017-07-04 DIAGNOSIS — R7309 Other abnormal glucose: Secondary | ICD-10-CM | POA: Diagnosis not present

## 2017-07-04 DIAGNOSIS — E785 Hyperlipidemia, unspecified: Secondary | ICD-10-CM | POA: Diagnosis not present

## 2017-07-23 ENCOUNTER — Emergency Department (HOSPITAL_COMMUNITY)
Admission: EM | Admit: 2017-07-23 | Discharge: 2017-07-23 | Disposition: A | Discharge status: Home / Self Care | Payer: Medicare Other | Attending: Emergency Medicine | Admitting: Emergency Medicine

## 2017-07-23 ENCOUNTER — Emergency Department (HOSPITAL_COMMUNITY): Payer: Medicare Other

## 2017-07-23 ENCOUNTER — Other Ambulatory Visit: Payer: Self-pay

## 2017-07-23 ENCOUNTER — Encounter (HOSPITAL_COMMUNITY): Payer: Self-pay | Admitting: Emergency Medicine

## 2017-07-23 DIAGNOSIS — Y999 Unspecified external cause status: Secondary | ICD-10-CM | POA: Diagnosis not present

## 2017-07-23 DIAGNOSIS — M79632 Pain in left forearm: Secondary | ICD-10-CM | POA: Diagnosis not present

## 2017-07-23 DIAGNOSIS — Z79899 Other long term (current) drug therapy: Secondary | ICD-10-CM | POA: Diagnosis not present

## 2017-07-23 DIAGNOSIS — S40022A Contusion of left upper arm, initial encounter: Secondary | ICD-10-CM | POA: Insufficient documentation

## 2017-07-23 DIAGNOSIS — T148XXA Other injury of unspecified body region, initial encounter: Secondary | ICD-10-CM

## 2017-07-23 DIAGNOSIS — M791 Myalgia, unspecified site: Secondary | ICD-10-CM | POA: Diagnosis not present

## 2017-07-23 DIAGNOSIS — Y92410 Unspecified street and highway as the place of occurrence of the external cause: Secondary | ICD-10-CM | POA: Diagnosis not present

## 2017-07-23 DIAGNOSIS — J45909 Unspecified asthma, uncomplicated: Secondary | ICD-10-CM | POA: Diagnosis not present

## 2017-07-23 DIAGNOSIS — Y9389 Activity, other specified: Secondary | ICD-10-CM | POA: Insufficient documentation

## 2017-07-23 DIAGNOSIS — I10 Essential (primary) hypertension: Secondary | ICD-10-CM | POA: Insufficient documentation

## 2017-07-23 DIAGNOSIS — M7918 Myalgia, other site: Secondary | ICD-10-CM

## 2017-07-23 DIAGNOSIS — S4992XA Unspecified injury of left shoulder and upper arm, initial encounter: Secondary | ICD-10-CM | POA: Diagnosis present

## 2017-07-23 DIAGNOSIS — S5012XA Contusion of left forearm, initial encounter: Secondary | ICD-10-CM | POA: Diagnosis not present

## 2017-07-23 MED ORDER — TRAMADOL HCL 50 MG PO TABS: 50.0000 mg | ORAL_TABLET | Freq: Four times a day (QID) | ORAL | 0 refills | Status: DC | PRN
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
## 2017-07-23 NOTE — ED Notes (Signed)
Patient transported to X-ray 

## 2017-07-24 ENCOUNTER — Other Ambulatory Visit: Payer: Self-pay

## 2017-08-20 ENCOUNTER — Other Ambulatory Visit: Payer: Self-pay

## 2017-08-20 NOTE — Patient Outreach (Signed)
[facility]) Care Management  08/20/2017  Sheri Fleming [birth year] [id number]   Medication Adherence call to [name] spoke with patient she said her prescriptions are ready at [facility] and will pick up in the next couple of days,she said she always gets a 90 days supply on her medications.[name] is showing past due under [place].  [facility] Management Direct Dial [phone]  Fax [phone] [email or web address]

## 2017-11-02 ENCOUNTER — Other Ambulatory Visit: Payer: Self-pay

## 2017-11-02 NOTE — Patient Outreach (Signed)
[facility]) Care Management  11/02/2017  ILISA HAYWORTH 01/04/1951 [id number]   Medication Adherence call to [name] left a message for patient to call back patient is due on Lisinopril / Hctz 20/25 mg under [place].   [facility] Management Direct Dial [phone]  Fax [phone] [email or web address]

## 2017-12-03 DIAGNOSIS — E785 Hyperlipidemia, unspecified: Secondary | ICD-10-CM | POA: Diagnosis not present

## 2017-12-03 DIAGNOSIS — J4520 Mild intermittent asthma, uncomplicated: Secondary | ICD-10-CM | POA: Diagnosis not present

## 2017-12-03 DIAGNOSIS — R7309 Other abnormal glucose: Secondary | ICD-10-CM | POA: Diagnosis not present

## 2017-12-03 DIAGNOSIS — I10 Essential (primary) hypertension: Secondary | ICD-10-CM | POA: Diagnosis not present

## 2017-12-03 DIAGNOSIS — G4730 Sleep apnea, unspecified: Secondary | ICD-10-CM | POA: Diagnosis not present

## 2017-12-09 DIAGNOSIS — Z0000 Encounter for general adult medical examination without abnormal findings: Secondary | ICD-10-CM | POA: Diagnosis not present

## 2018-03-20 DIAGNOSIS — S40029A Contusion of unspecified upper arm, initial encounter: Secondary | ICD-10-CM | POA: Diagnosis not present

## 2018-03-20 DIAGNOSIS — S20421A Blister (nonthermal) of right back wall of thorax, initial encounter: Secondary | ICD-10-CM | POA: Diagnosis not present

## 2018-04-01 ENCOUNTER — Emergency Department (HOSPITAL_COMMUNITY)
Admission: EM | Admit: 2018-04-01 | Discharge: 2018-04-01 | Disposition: A | Discharge status: Home / Self Care | Payer: Medicare Other | Attending: Emergency Medicine | Admitting: Emergency Medicine

## 2018-04-01 ENCOUNTER — Emergency Department (HOSPITAL_COMMUNITY): Payer: Medicare Other

## 2018-04-01 ENCOUNTER — Encounter (HOSPITAL_COMMUNITY): Payer: Self-pay | Admitting: Emergency Medicine

## 2018-04-01 ENCOUNTER — Other Ambulatory Visit: Payer: Self-pay

## 2018-04-01 DIAGNOSIS — M25511 Pain in right shoulder: Secondary | ICD-10-CM | POA: Diagnosis not present

## 2018-04-01 DIAGNOSIS — W108XXA Fall (on) (from) other stairs and steps, initial encounter: Secondary | ICD-10-CM | POA: Diagnosis not present

## 2018-04-01 DIAGNOSIS — Y999 Unspecified external cause status: Secondary | ICD-10-CM | POA: Insufficient documentation

## 2018-04-01 DIAGNOSIS — J45909 Unspecified asthma, uncomplicated: Secondary | ICD-10-CM | POA: Insufficient documentation

## 2018-04-01 DIAGNOSIS — Y929 Unspecified place or not applicable: Secondary | ICD-10-CM | POA: Diagnosis not present

## 2018-04-01 DIAGNOSIS — S20211A Contusion of right front wall of thorax, initial encounter: Secondary | ICD-10-CM | POA: Insufficient documentation

## 2018-04-01 DIAGNOSIS — Z79899 Other long term (current) drug therapy: Secondary | ICD-10-CM | POA: Insufficient documentation

## 2018-04-01 DIAGNOSIS — R0781 Pleurodynia: Secondary | ICD-10-CM | POA: Diagnosis not present

## 2018-04-01 DIAGNOSIS — Y9389 Activity, other specified: Secondary | ICD-10-CM | POA: Diagnosis not present

## 2018-04-01 DIAGNOSIS — I10 Essential (primary) hypertension: Secondary | ICD-10-CM | POA: Diagnosis not present

## 2018-04-01 DIAGNOSIS — S4991XA Unspecified injury of right shoulder and upper arm, initial encounter: Secondary | ICD-10-CM | POA: Diagnosis not present

## 2018-04-01 DIAGNOSIS — W19XXXA Unspecified fall, initial encounter: Secondary | ICD-10-CM

## 2018-04-01 DIAGNOSIS — S299XXA Unspecified injury of thorax, initial encounter: Secondary | ICD-10-CM | POA: Diagnosis not present
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
## 2018-04-01 NOTE — Discharge Instructions (Addendum)
Take tylenol and ibuprofen as needed for pain. Continue your physical therapy. Follow up with your doctor. Return here as needed.

## 2018-04-01 NOTE — ED Triage Notes (Signed)
RT arm and RT rib pain since pt fell up some steps and landed on cement floor.  Denies hitting head

## 2018-04-02 ENCOUNTER — Ambulatory Visit (HOSPITAL_COMMUNITY): Payer: Medicare Other | Attending: Family Medicine | Admitting: Occupational Therapy

## 2018-04-02 ENCOUNTER — Other Ambulatory Visit: Payer: Self-pay

## 2018-04-02 ENCOUNTER — Encounter (HOSPITAL_COMMUNITY): Payer: Self-pay | Admitting: Occupational Therapy

## 2018-04-02 DIAGNOSIS — M25511 Pain in right shoulder: Secondary | ICD-10-CM

## 2018-04-02 DIAGNOSIS — R29898 Other symptoms and signs involving the musculoskeletal system: Secondary | ICD-10-CM
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
## 2018-04-02 NOTE — Patient Instructions (Signed)
SHOULDER: Flexion On Table   Place hands on towel placed on table, elbows straight. Lean forward with you upper body, pushing towel away from body. _10-15__ reps per set, _2-3__ sets per day  Abduction (Passive)   With arm out to side, resting on towel placed on table, keeping trunk away from table, lean to the side while pushing towel away from body.  Repeat _10-15___ times. Do _2-3___ sessions per day.  Copyright  VHI. All rights reserved.      1) Seated Row   Sit up straight with elbows by your sides. Pull back with shoulders/elbows, keeping forearms straight, as if pulling back on the reins of a horse. Squeeze shoulder blades together. Repeat _10-15__times, _2-3___sets/day    2) Shoulder Elevation    Sit up straight with arms by your sides. Slowly bring your shoulders up towards your ears. Repeat_10-15__times, __2-3__ sets/day    3) Shoulder Extension    Sit up straight with both arms by your side, draw your arms back behind your waist. Keep your elbows straight. Repeat __10-15__times, __2-3__sets/day.

## 2018-04-05 ENCOUNTER — Telehealth (HOSPITAL_COMMUNITY): Payer: Self-pay | Admitting: Family Medicine

## 2018-04-05 ENCOUNTER — Ambulatory Visit (HOSPITAL_COMMUNITY): Payer: Medicare Other

## 2018-04-05 NOTE — Telephone Encounter (Signed)
04/05/18  pt called to cx said she had to take her grandbaby to the dr

## 2018-04-07 DIAGNOSIS — Z1231 Encounter for screening mammogram for malignant neoplasm of breast: Secondary | ICD-10-CM | POA: Diagnosis not present

## 2018-04-13 ENCOUNTER — Ambulatory Visit (HOSPITAL_COMMUNITY): Payer: Medicare Other | Attending: Family Medicine

## 2018-04-13 ENCOUNTER — Encounter (HOSPITAL_COMMUNITY): Payer: Self-pay

## 2018-04-13 DIAGNOSIS — M25511 Pain in right shoulder: Secondary | ICD-10-CM | POA: Insufficient documentation

## 2018-04-13 DIAGNOSIS — R29898 Other symptoms and signs involving the musculoskeletal system: Secondary | ICD-10-CM | POA: Diagnosis not present

## 2018-04-13 NOTE — Therapy (Signed)
[place], [place], [address] Phone: [phone]   Fax:  [phone]  Occupational Therapy Treatment  Patient Details  Name: Sheri Fleming MRN: [id number] Date of Birth: September 03, 1950 Referring Provider (OT): Dr. Lucianne Lei   Encounter Date: 04/13/2018  OT End of Session - 04/13/18 1159    Visit Number  2    Number of Visits  8    Date for OT Re-Evaluation  05/02/18    Authorization Type  1) UHC Medicare 2) Medicaid    OT Start Time  0913    OT Stop Time  0948    OT Time Calculation (min)  35 min    Activity Tolerance  Patient tolerated treatment well    Behavior During Therapy  [facility] for tasks assessed/performed       Past Medical History:  Diagnosis Date  . Asthma   . High cholesterol   . Hypertension   . Sleep apnea     Past Surgical History:  Procedure Laterality Date  . ABDOMINAL HYSTERECTOMY    . FRACTURE SURGERY      There were no vitals filed for this visit.  Subjective Assessment - 04/13/18 0933    Subjective   S: It's a whole lot better than it was before.     Currently in Pain?  Yes    Pain Score  5     Pain Location  Shoulder    Pain Orientation  Right    Pain Descriptors / Indicators  Sore    Pain Type  Acute pain    Pain Radiating Towards  N/A    Pain Onset  1 to 4 weeks ago    Pain Frequency  Constant    Aggravating Factors   movement    Pain Relieving Factors  ice pack and Tylenol    Effect of Pain on Daily Activities  min effect         [facility] OT Assessment - 04/13/18 0936      Assessment   Medical Diagnosis  right shoulder pain      Precautions   Precautions  None               OT Treatments/Exercises (OP) - 04/13/18 0936      Exercises   Exercises  Shoulder      Shoulder Exercises: Supine   Protraction  PROM;5 reps    Horizontal ABduction  PROM;5 reps    External Rotation  PROM;5 reps    Internal Rotation  PROM;5 reps    Flexion  PROM;5 reps     ABduction  PROM;5 reps      Shoulder Exercises: Standing   Protraction  AROM;10 reps    Horizontal ABduction  AROM;10 reps    External Rotation  AROM;10 reps    Internal Rotation  AROM;10 reps    Flexion  AROM;10 reps    ABduction  AROM;10 reps    Extension  Theraband;10 reps    Theraband Level (Shoulder Extension)  Level 2 (Red)    Row  Theraband;10 reps    Theraband Level (Shoulder Row)  Level 2 (Red)    Retraction  Theraband;10 reps    Theraband Level (Shoulder Retraction)  Level 2 (Red)      Shoulder Exercises: ROM/Strengthening   Proximal Shoulder Strengthening, Seated  10X A/ROM no rest breaks      Manual Therapy   Manual Therapy  Soft tissue mobilization    Manual therapy comments  Manual therapy completed prior to exercises.     Soft tissue mobilization  Myofascial release and manual stretching completed right upper arm, and trapezius region to decrease fascial restrictions and increase joint mobility in a pain free zone.              OT Education - 04/13/18 1159    Education Details  Patient is to stop table slides. Continue with A/ROM scapular exercises prior to beginning new exercises provided today: red theraband scapular strengthening. Reviewed therapy goals with patient.    Person(s) Educated  Patient    Methods  Explanation;Demonstration;Verbal cues;Handout;Tactile cues    Comprehension  Returned demonstration;Verbalized understanding       OT Short Term Goals - 04/13/18 1202      OT SHORT TERM GOAL #1   Title  Pt will be provided with HEP to improve mobility in RUE required for use as dominant during ADL completion.     Time  4    Period  Weeks    Status  On-going    Target Date  05/02/18      OT SHORT TERM GOAL #2   Title  Pt will decrease RUE fascial restrictions to minimal amounts or less to improve mobility required for functional reaching tasks.     Time  4    Period  Weeks    Status  On-going      OT SHORT TERM GOAL #3   Title  Pt will  decrease pain in RUE to 3/10 or less to improve ability to wash/fix hair.     Time  4    Period  Weeks    Status  On-going      OT SHORT TERM GOAL #4   Title  Pt will improve RUE A/ROM to WNL to increase ability to reach into overhead cabinets and behind back.     Time  4    Period  Weeks    Status  On-going      OT SHORT TERM GOAL #5   Title  Pt will improve RUE strength to 4+/5 or greater to increase ability to perform housekeeping tasks at home and for work.     Time  4    Period  Weeks    Status  On-going               Plan - 04/13/18 1200    Clinical Impression Statement  A: Initiated myofascial release, manual stretching, A/ROM shoulder exercises, and scapular strengthening. HEP was updated to include therband for scapular strengthening. patient required verbal and tactile cues for form and technique during session. Max trigger point palpated in right upper trapezius region. Manual techniques completed to address with good results.     Body Structure / Function / Physical Skills  ROM;Flexibility;Edema;Fascial restriction;ADL;Pain;Strength;UE functional use    Plan  P: Complete manaul techniques to decrease fascial restrictions. Follow up on HEP. Continue with A/ROM shoulder exercises. Add overhead lacing and UBE bike.     Consulted and Agree with Plan of Care  Patient       Patient will benefit from skilled therapeutic intervention in order to improve the following deficits and impairments:  Body Structure / Function / Physical Skills  Visit Diagnosis: Acute pain of right shoulder  Other symptoms and signs involving the musculoskeletal system    Problem List There are no active problems to display for this patient.  Sheri Fleming, OTR/L,CBIS  [phone]   04/13/2018, 12:03 PM  [place]  [facility] [address] [place], [place], [address] Phone: [phone]   Fax:  [phone]  Name: Sheri Fleming MRN:  [id number] Date of Birth: 04/13/1950

## 2018-04-13 NOTE — Patient Instructions (Signed)

## 2018-04-14 ENCOUNTER — Telehealth (HOSPITAL_COMMUNITY): Payer: Self-pay | Admitting: Family Medicine

## 2018-04-14 NOTE — Telephone Encounter (Signed)
04/14/10  Re: 3/12 appt... I called to move to afternoon time but patient said that at the times I gave her she would be picking up kids and couldn't come at those tim

## 2018-04-15 ENCOUNTER — Ambulatory Visit (HOSPITAL_COMMUNITY): Payer: Medicare Other | Admitting: Occupational Therapy

## 2018-04-16 DIAGNOSIS — S40029D Contusion of unspecified upper arm, subsequent encounter: Secondary | ICD-10-CM | POA: Diagnosis not present

## 2018-04-20 ENCOUNTER — Encounter (HOSPITAL_COMMUNITY): Payer: Self-pay

## 2018-04-20 ENCOUNTER — Ambulatory Visit (HOSPITAL_COMMUNITY): Payer: Medicare Other

## 2018-04-20 ENCOUNTER — Other Ambulatory Visit: Payer: Self-pay

## 2018-04-20 DIAGNOSIS — R29898 Other symptoms and signs involving the musculoskeletal system: Secondary | ICD-10-CM | POA: Diagnosis not present

## 2018-04-20 DIAGNOSIS — M25511 Pain in right shoulder: Secondary | ICD-10-CM | POA: Diagnosis not present

## 2018-04-20 NOTE — Therapy (Signed)
[place] [address] [place], [place], [address] Phone: [phone]   Fax:  [phone]  Occupational Therapy Treatment  Patient Details  Name: Sheri Fleming MRN: [id number] Date of Birth: January 23, 1951 Referring Provider (OT): Dr. Lucianne Lei   Encounter Date: 04/20/2018  OT End of Session - 04/20/18 1245    Visit Number  3    Number of Visits  8    Date for OT Re-Evaluation  05/02/18    Authorization Type  1) UHC Medicare 2) Medicaid    OT Start Time  1120    OT Stop Time  1200    OT Time Calculation (min)  40 min    Activity Tolerance  Patient tolerated treatment well    Behavior During Therapy  [facility] for tasks assessed/performed       Past Medical History:  Diagnosis Date  . Asthma   . High cholesterol   . Hypertension   . Sleep apnea     Past Surgical History:  Procedure Laterality Date  . ABDOMINAL HYSTERECTOMY    . FRACTURE SURGERY      There were no vitals filed for this visit.  Subjective Assessment - 04/20/18 1134    Subjective   S: The X-ray showed some bone spurs. When I see the doctor again if I need it she may do a shot for the pain and go from there.     Currently in Pain?  Yes    Pain Score  4     Pain Location  Shoulder    Pain Orientation  Right    Pain Descriptors / Indicators  Sore    Pain Type  Acute pain    Pain Radiating Towards  down to arm    Pain Onset  1 to 4 weeks ago    Pain Frequency  Occasional    Aggravating Factors   Movement and use    Pain Relieving Factors  Tylenol, ice    Effect of Pain on Daily Activities  min effect    Multiple Pain Sites  No         [facility] OT Assessment - 04/20/18 1137      Assessment   Medical Diagnosis  right shoulder pain      Precautions   Precautions  None               OT Treatments/Exercises (OP) - 04/20/18 1137      Exercises   Exercises  Shoulder      Shoulder Exercises: Supine   Protraction  PROM;5 reps    Horizontal  ABduction  PROM;5 reps    External Rotation  PROM;5 reps    Internal Rotation  PROM;5 reps    Flexion  PROM;5 reps    ABduction  PROM;5 reps      Shoulder Exercises: Standing   Protraction  AROM;12 reps    Horizontal ABduction  AROM;12 reps    External Rotation  AROM;12 reps    Internal Rotation  AROM;12 reps    Flexion  AROM;12 reps    ABduction  AROM;12 reps    Extension  Theraband;12 reps    Theraband Level (Shoulder Extension)  Level 2 (Red)    Row  Theraband;12 reps    Theraband Level (Shoulder Row)  Level 2 (Red)    Retraction  Theraband;12 reps    Theraband Level (Shoulder Retraction)  Level 2 (Red)      Shoulder Exercises: Therapy Diona Foley   Other  Therapy Ball Exercises  Green therapy ball; 10X, chest press, circles, (right/left), flexion      Shoulder Exercises: ROM/Strengthening   UBE (Upper Arm Bike)  Level 1 2' forward 2' reverse   pace: 4.0-5.0   X to V Arms  10X A/ROM    Proximal Shoulder Strengthening, Seated  12X A/ROM 3 rest breaks      Manual Therapy   Manual Therapy  Soft tissue mobilization    Manual therapy comments  Manual therapy completed prior to exercises.     Soft tissue mobilization  Myofascial release and manual stretching completed right upper arm, and trapezius region to decrease fascial restrictions and increase joint mobility in a pain free zone.                OT Short Term Goals - 04/13/18 1202      OT SHORT TERM GOAL #1   Title  Pt will be provided with HEP to improve mobility in RUE required for use as dominant during ADL completion.     Time  4    Period  Weeks    Status  On-going    Target Date  05/02/18      OT SHORT TERM GOAL #2   Title  Pt will decrease RUE fascial restrictions to minimal amounts or less to improve mobility required for functional reaching tasks.     Time  4    Period  Weeks    Status  On-going      OT SHORT TERM GOAL #3   Title  Pt will decrease pain in RUE to 3/10 or less to improve ability to  wash/fix hair.     Time  4    Period  Weeks    Status  On-going      OT SHORT TERM GOAL #4   Title  Pt will improve RUE A/ROM to WNL to increase ability to reach into overhead cabinets and behind back.     Time  4    Period  Weeks    Status  On-going      OT SHORT TERM GOAL #5   Title  Pt will improve RUE strength to 4+/5 or greater to increase ability to perform housekeeping tasks at home and for work.     Time  4    Period  Weeks    Status  On-going               Plan - 04/20/18 1245    Clinical Impression Statement  A: Patient was able to increase repetitions for A/ROM shoulder exercises. Added additional exercises to focus on scapular and shoulder stability. patient required VC for form and technique as needed.     Body Structure / Function / Physical Skills  ROM;Flexibility;Edema;Fascial restriction;ADL;Pain;Strength;UE functional use    Plan  P: Continue with A/ROm shoulder exercises. Add overhead lacing.     Consulted and Agree with Plan of Care  Patient       Patient will benefit from skilled therapeutic intervention in order to improve the following deficits and impairments:  Body Structure / Function / Physical Skills  Visit Diagnosis: Acute pain of right shoulder  Other symptoms and signs involving the musculoskeletal system    Problem List There are no active problems to display for this patient.  Ailene Ravel, OTR/L,CBIS  [phone]  04/20/2018, 12:47 PM  [place] [address] [place], [place], [address] Phone: [phone]   Fax:  [phone]  Name: Sheri Fleming MRN: [id number] Date of Birth: 1950/08/06

## 2018-04-22 ENCOUNTER — Ambulatory Visit (HOSPITAL_COMMUNITY): Payer: Medicare Other

## 2018-04-22 ENCOUNTER — Encounter (HOSPITAL_COMMUNITY): Payer: Self-pay

## 2018-04-22 ENCOUNTER — Other Ambulatory Visit: Payer: Self-pay

## 2018-04-22 DIAGNOSIS — R29898 Other symptoms and signs involving the musculoskeletal system: Secondary | ICD-10-CM | POA: Diagnosis not present

## 2018-04-22 DIAGNOSIS — M25511 Pain in right shoulder: Secondary | ICD-10-CM | POA: Diagnosis not present

## 2018-04-22 NOTE — Therapy (Signed)
[place], [place], [address] Phone: [phone]   Fax:  [phone]  Occupational Therapy Treatment  Patient Details  Name: Sheri Fleming MRN: [id number] Date of Birth: July 29, 1950 Referring Provider (OT): Dr. Lucianne Lei   Encounter Date: 04/22/2018  OT End of Session - 04/22/18 0928    Visit Number  4    Number of Visits  8    Date for OT Re-Evaluation  05/02/18    Authorization Type  1) UHC Medicare 2) Medicaid    OT Start Time  0907    OT Stop Time  0947    OT Time Calculation (min)  40 min    Activity Tolerance  Patient tolerated treatment well    Behavior During Therapy  [facility] for tasks assessed/performed       Past Medical History:  Diagnosis Date  . Asthma   . High cholesterol   . Hypertension   . Sleep apnea     Past Surgical History:  Procedure Laterality Date  . ABDOMINAL HYSTERECTOMY    . FRACTURE SURGERY      There were no vitals filed for this visit.  Subjective Assessment - 04/22/18 0926    Subjective   S: I didn't sleep well last night. My arm was hurting a lot. I feel asleep at 3am.    Currently in Pain?  Yes    Pain Score  8     Pain Location  Shoulder    Pain Orientation  Right    Pain Descriptors / Indicators  Sore    Pain Type  Acute pain         [facility] OT Assessment - 04/22/18 0927      Assessment   Medical Diagnosis  right shoulder pain      Precautions   Precautions  None               OT Treatments/Exercises (OP) - 04/22/18 0927      Exercises   Exercises  Shoulder      Shoulder Exercises: Standing   Protraction  AROM;10 reps    Horizontal ABduction  AROM;10 reps    External Rotation  AROM;10 reps    Internal Rotation  AROM;10 reps    Flexion  AROM;10 reps    ABduction  AROM;10 reps      Shoulder Exercises: ROM/Strengthening   Over Head Lace  seated 1' pulleys      Modalities   Modalities  Electrical Stimulation;Moist Heat      Moist  Heat Therapy   Number Minutes Moist Heat  10 Minutes    Moist Heat Location  Shoulder      Electrical Stimulation   Electrical Stimulation Location  right upper arm and trapezius    Electrical Stimulation Action  interferential    Electrical Stimulation Parameters  7.0 CV    Electrical Stimulation Goals  Pain      Manual Therapy   Manual Therapy  Soft tissue mobilization    Manual therapy comments  Manual therapy completed prior to exercises.     Soft tissue mobilization  Myofascial release and manual stretching completed right upper arm, and trapezius region to decrease fascial restrictions and increase joint mobility in a pain free zone.                OT Short Term Goals - 04/13/18 1202      OT SHORT TERM GOAL #1   Title  Pt will  be provided with HEP to improve mobility in RUE required for use as dominant during ADL completion.     Time  4    Period  Weeks    Status  On-going    Target Date  05/02/18      OT SHORT TERM GOAL #2   Title  Pt will decrease RUE fascial restrictions to minimal amounts or less to improve mobility required for functional reaching tasks.     Time  4    Period  Weeks    Status  On-going      OT SHORT TERM GOAL #3   Title  Pt will decrease pain in RUE to 3/10 or less to improve ability to wash/fix hair.     Time  4    Period  Weeks    Status  On-going      OT SHORT TERM GOAL #4   Title  Pt will improve RUE A/ROM to WNL to increase ability to reach into overhead cabinets and behind back.     Time  4    Period  Weeks    Status  On-going      OT SHORT TERM GOAL #5   Title  Pt will improve RUE strength to 4+/5 or greater to increase ability to perform housekeeping tasks at home and for work.     Time  4    Period  Weeks    Status  On-going               Plan - 04/22/18 0388    Clinical Impression Statement  A: Pt presents to session with increased pain level with reports of inability to get a good night sleep. Manual  techniques completed to address fascial restrictions with ES and moist heat applied following. Pt reports no decrease in pain after use of ES and heat. Completed gentle A/ROM and overhead lacing due to pain level and instructed patient to continue to use her RUE during the day while not making the pain worse, Apply heat once home.     Body Structure / Function / Physical Skills  ROM;Flexibility;Edema;Fascial restriction;ADL;Pain;Strength;UE functional use    Plan  P: Continue with A/ROM shoulder exercises.     Consulted and Agree with Plan of Care  Patient       Patient will benefit from skilled therapeutic intervention in order to improve the following deficits and impairments:  Body Structure / Function / Physical Skills  Visit Diagnosis: Other symptoms and signs involving the musculoskeletal system  Acute pain of right shoulder    Problem List There are no active problems to display for this patient.   Ailene Ravel, OTR/L,CBIS  [phone]  04/22/2018, 10:01 AM  [place] [address] [place], [place], [address] Phone: [phone]   Fax:  [phone]  Name: Sheri Fleming MRN: [id number] Date of Birth: [birth year]

## 2018-04-23 ENCOUNTER — Telehealth (HOSPITAL_COMMUNITY): Payer: Self-pay | Admitting: Occupational Therapy

## 2018-04-23 NOTE — Telephone Encounter (Signed)
Called and left message for pt regarding 2 week clinic closure for COVID-19 precautions. Asked to return call to discuss HEP and update as necessary.    Guadelupe Sabin, OTR/L  [phone] 04/23/2018

## 2018-04-26 IMAGING — DX DG CHEST 2V
2 series · 2 of 2 positions shown · non-contrast
Comparison: 12/23/2016

CLINICAL DATA: Productive cough

EXAM:
CHEST  2 VIEW

[chest pa]
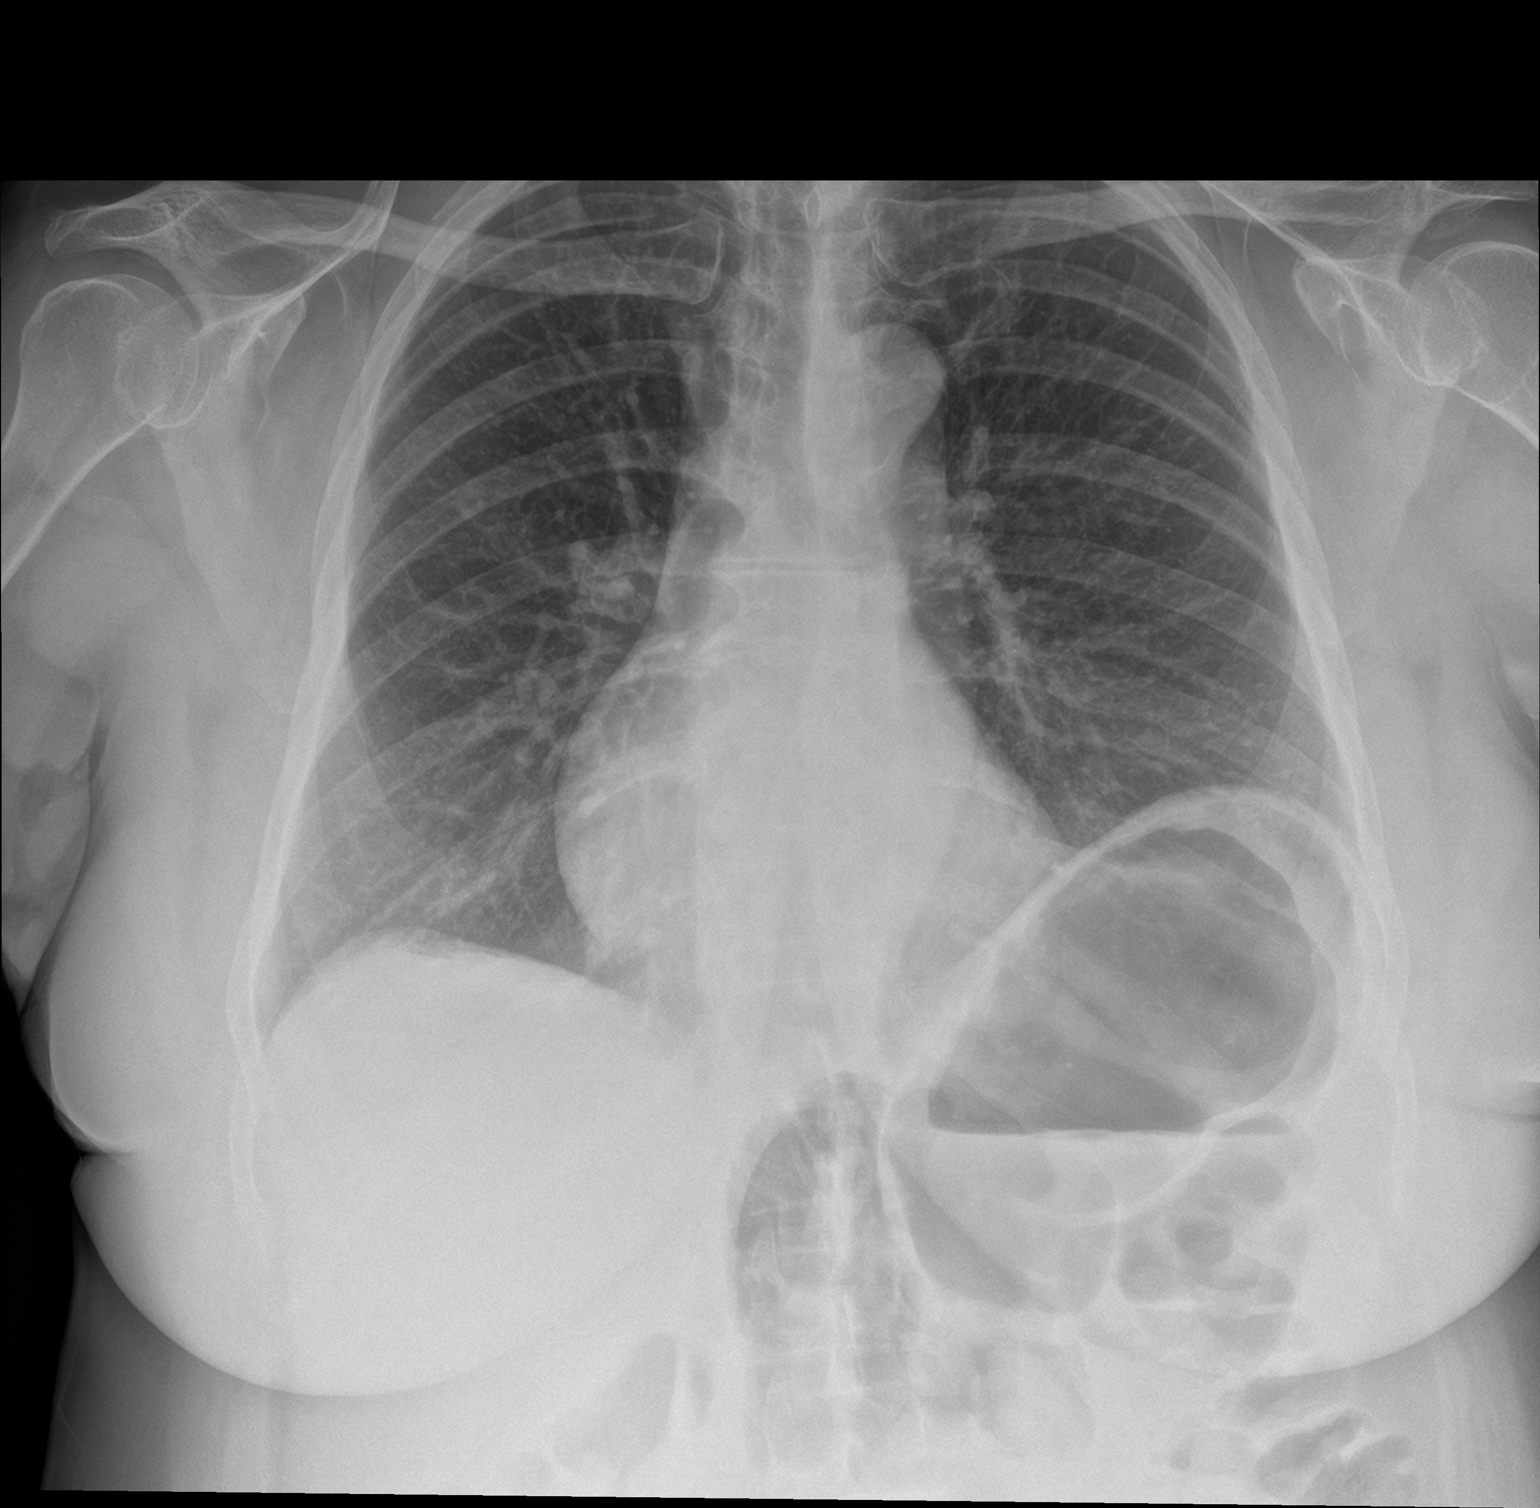

[chest lat]
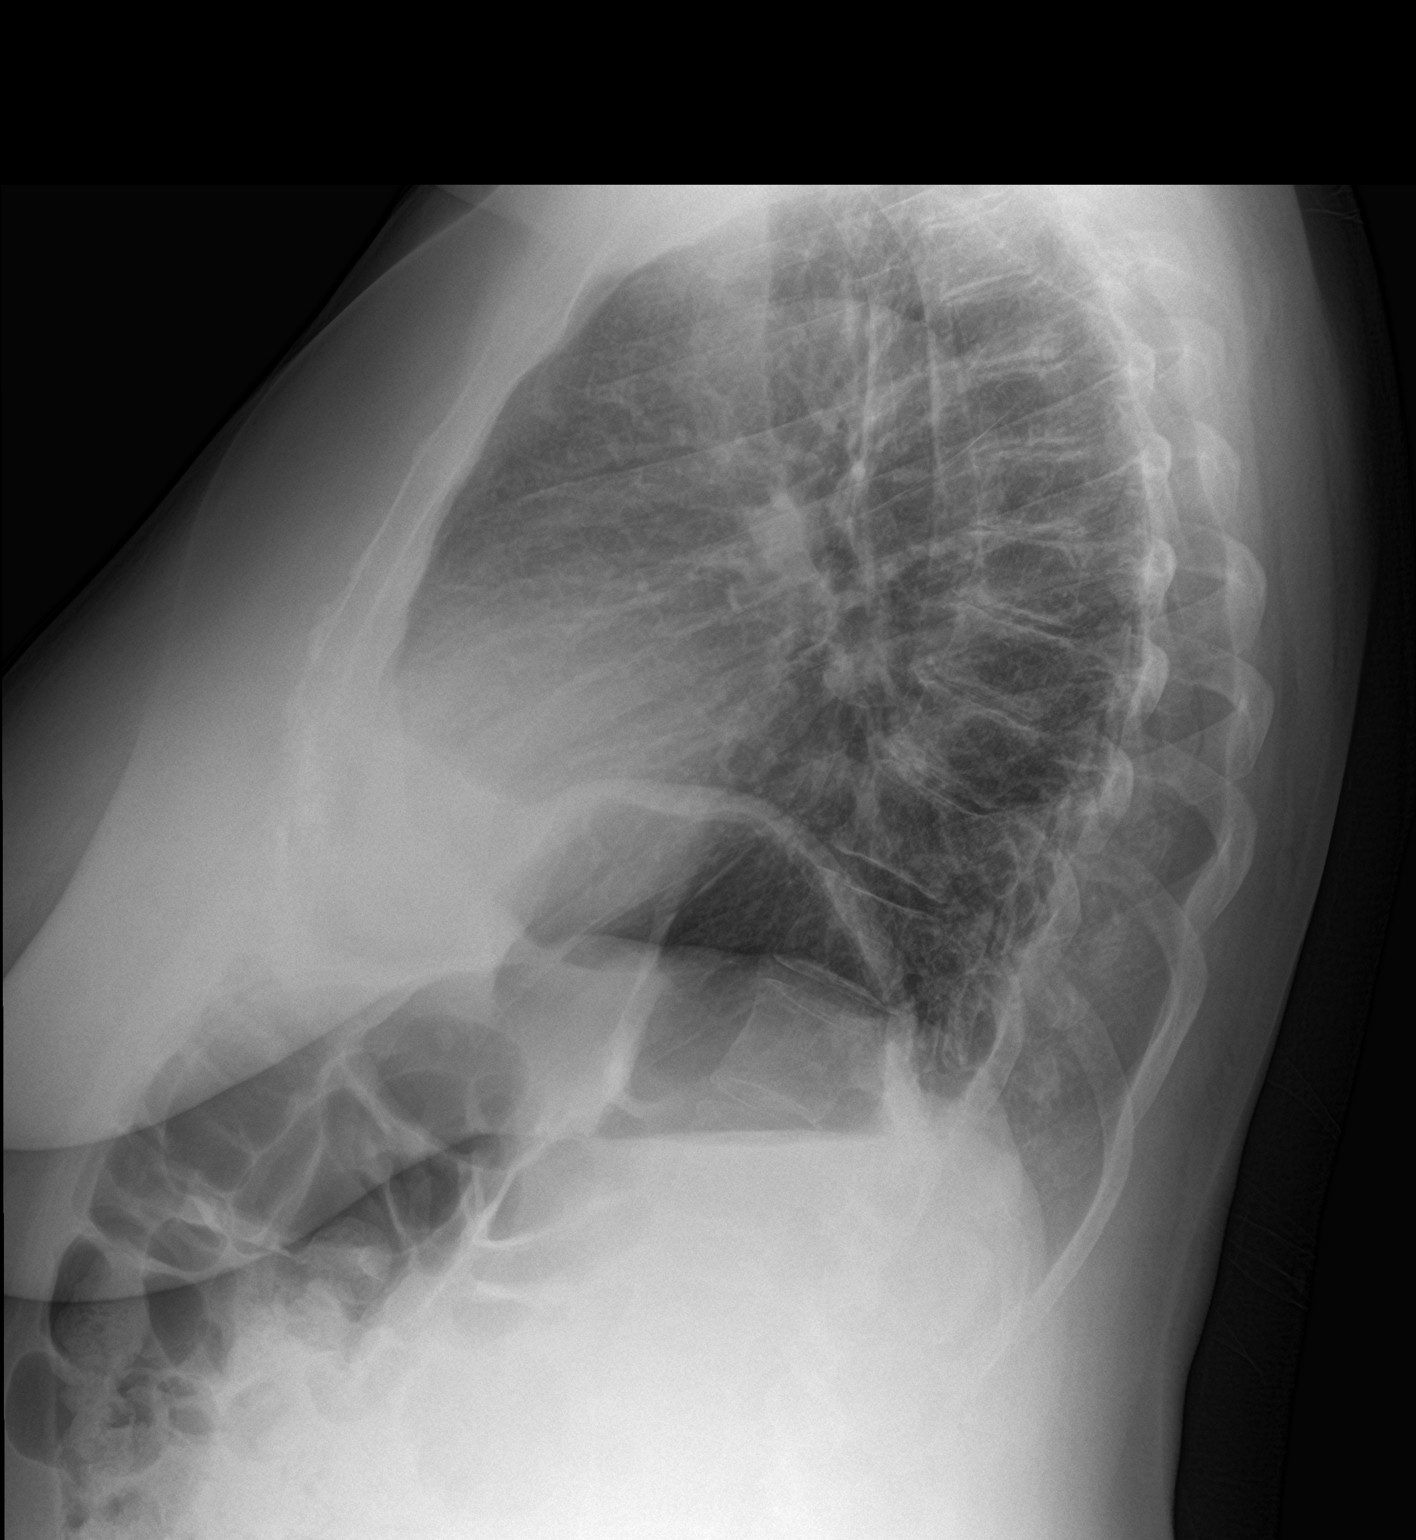

[2 of 2 positions shown; findings below may reference images not displayed]

FINDINGS: Elevation of the left diaphragm. No focal pulmonary infiltrate,
consolidation or effusion. Normal cardiomediastinal silhouette. No
pneumothorax. Degenerative changes of the spine.
IMPRESSION: No active cardiopulmonary disease.

## 2018-04-27 ENCOUNTER — Ambulatory Visit (HOSPITAL_COMMUNITY): Payer: Medicare Other

## 2018-04-30 ENCOUNTER — Ambulatory Visit (HOSPITAL_COMMUNITY): Payer: Medicare Other | Admitting: Occupational Therapy

## 2018-05-04 ENCOUNTER — Encounter (HOSPITAL_COMMUNITY): Payer: Medicare Other | Admitting: Occupational Therapy

## 2018-05-07 ENCOUNTER — Encounter (HOSPITAL_COMMUNITY): Payer: Medicare Other | Admitting: Occupational Therapy

## 2018-07-15 ENCOUNTER — Other Ambulatory Visit: Payer: Self-pay

## 2018-07-15 NOTE — Patient Outreach (Signed)
[facility]) Care Management  07/15/2018  Sheri Fleming 03/23/1950 [id number]   Medication Adherence call to [name] [facility] Verify spoke with patient she is past due on Lisinopril/Hctzc 10/25 mg patient explain she has plenty of medication at this time and will order when she is finished. [name] is showing past due under [place].  [place] Management Direct Dial [phone]  Fax [phone] [email or web address]

## 2018-07-16 DIAGNOSIS — M8589 Other specified disorders of bone density and structure, multiple sites: Secondary | ICD-10-CM | POA: Diagnosis not present

## 2018-07-16 DIAGNOSIS — R29890 Loss of height: Secondary | ICD-10-CM | POA: Diagnosis not present

## 2018-07-16 DIAGNOSIS — J45909 Unspecified asthma, uncomplicated: Secondary | ICD-10-CM | POA: Diagnosis not present

## 2018-08-04 DIAGNOSIS — J4520 Mild intermittent asthma, uncomplicated: Secondary | ICD-10-CM | POA: Diagnosis not present

## 2018-08-04 DIAGNOSIS — R7309 Other abnormal glucose: Secondary | ICD-10-CM | POA: Diagnosis not present

## 2018-08-04 DIAGNOSIS — I10 Essential (primary) hypertension: Secondary | ICD-10-CM | POA: Diagnosis not present

## 2018-08-04 DIAGNOSIS — E785 Hyperlipidemia, unspecified: Secondary | ICD-10-CM | POA: Diagnosis not present

## 2018-09-11 ENCOUNTER — Other Ambulatory Visit: Payer: Self-pay

## 2018-09-11 ENCOUNTER — Emergency Department (HOSPITAL_COMMUNITY): Payer: Medicare Other

## 2018-09-11 ENCOUNTER — Emergency Department (HOSPITAL_COMMUNITY)
Admission: EM | Admit: 2018-09-11 | Discharge: 2018-09-11 | Disposition: A | Discharge status: Home / Self Care | Payer: Medicare Other | Attending: Emergency Medicine | Admitting: Emergency Medicine

## 2018-09-11 ENCOUNTER — Encounter (HOSPITAL_COMMUNITY): Payer: Self-pay | Admitting: Emergency Medicine

## 2018-09-11 DIAGNOSIS — I10 Essential (primary) hypertension: Secondary | ICD-10-CM | POA: Insufficient documentation

## 2018-09-11 DIAGNOSIS — J4520 Mild intermittent asthma, uncomplicated: Secondary | ICD-10-CM | POA: Diagnosis not present

## 2018-09-11 DIAGNOSIS — Z79899 Other long term (current) drug therapy: Secondary | ICD-10-CM | POA: Insufficient documentation

## 2018-09-11 DIAGNOSIS — R0602 Shortness of breath: Secondary | ICD-10-CM | POA: Diagnosis not present

## 2018-09-11 DIAGNOSIS — R05 Cough: Secondary | ICD-10-CM | POA: Diagnosis present

## 2018-09-11 MED ORDER — BENZONATATE 200 MG PO CAPS: 200.0000 mg | ORAL_CAPSULE | Freq: Three times a day (TID) | ORAL | 0 refills | Status: DC | PRN

## 2018-09-11 MED ORDER — ALBUTEROL SULFATE (2.5 MG/3ML) 0.083% IN NEBU: 2.5000 mg | INHALATION_SOLUTION | Freq: Four times a day (QID) | RESPIRATORY_TRACT | 1 refills | Status: DC | PRN

## 2018-09-11 MED ORDER — PREDNISONE 20 MG PO TABS
40.0000 mg | ORAL_TABLET | Freq: Once | ORAL | Status: AC
  Administered 2018-09-11: 40 mg via ORAL
  Filled 2018-09-11: qty 2

## 2018-09-11 MED ORDER — PREDNISONE 20 MG PO TABS: 40.0000 mg | ORAL_TABLET | Freq: Every day | ORAL | 0 refills | Status: DC

## 2018-09-11 MED ORDER — ALBUTEROL SULFATE HFA 108 (90 BASE) MCG/ACT IN AERS
2.0000 | INHALATION_SPRAY | Freq: Once | RESPIRATORY_TRACT | Status: AC
  Administered 2018-09-11: 2 via RESPIRATORY_TRACT
  Filled 2018-09-11: qty 6.7

## 2018-09-11 NOTE — Discharge Instructions (Addendum)
Use your albuterol nebulizer, one treatment every 4-6 hrs as needed.  Start the prednisone tomorrow.  Follow-up with your doctor for recheck, return here if needed.

## 2018-09-11 NOTE — ED Provider Notes (Signed)
[facility] EMERGENCY DEPARTMENT Provider Note   CSN: [id number] Arrival date & time: 09/11/18  5284     History   Chief Complaint Chief Complaint  Patient presents with  . Cough    HPI Sheri Fleming is a 68 y.o. female.     HPI   Sheri Fleming is a 67 y.o. female with PMH of asthma and HTN, who presents to the Emergency Department complaining of increased wheezing and cough for 2 days.  She describes the cough as intermittent and occasionally productive of yellow sputum.  Cough is also associated with mild rhinorrhea.  She has been using her albuterol nebulizer at home and that typically provides significant relief, but she states she is running low on her albuterol vials for her machine.  She also states that she usually gets a prescription for prednisone that helps her symptoms.  She denies shortness of breath, chest pain, fever, chills, myalgias, sore throat or known COVID exposures.       Past Medical History:  Diagnosis Date  . Asthma   . High cholesterol   . Hypertension   . Sleep apnea     There are no active problems to display for this patient.   Past Surgical History:  Procedure Laterality Date  . ABDOMINAL HYSTERECTOMY    . FRACTURE SURGERY       OB History    Gravida  4   Para  4   Term  4   Preterm      AB      Living  4     SAB      TAB      Ectopic      Multiple      Live Births               Home Medications    Prior to Admission medications   Medication Sig Start Date End Date Taking? Authorizing Provider  albuterol (PROVENTIL HFA;VENTOLIN HFA) 108 (90 Base) MCG/ACT inhaler Inhale 1-2 puffs into the lungs every 6 (six) hours as needed for wheezing or shortness of breath. 12/23/16   LongWonda Olds, MD  alendronate (FOSAMAX) 70 MG tablet Take one tablet by mouth once every Thursday 10/22/16   [provider]  benzonatate (TESSALON) 100 MG capsule Take 1 capsule (100 mg total) by mouth every 8 (eight) hours.  12/23/16   Long, Wonda Olds, MD  lisinopril-hydrochlorothiazide (PRINZIDE,ZESTORETIC) 20-25 MG per tablet Take 1 tablet by mouth daily. 03/08/14   Tadan Shill, PA-C  montelukast (SINGULAIR) 10 MG tablet Take 1 tablet (10 mg total) by mouth at bedtime. 01/16/16   Evalee Jefferson, PA-C  predniSONE (STERAPRED UNI-PAK 21 TAB) 10 MG (21) TBPK tablet 6,5,4,3,2,1 taper 03/10/17   Fransico Meadow, PA-C  simvastatin (ZOCOR) 20 MG tablet Take 20 mg by mouth every evening.    [provider]  traMADol (ULTRAM) 50 MG tablet Take 1 tablet (50 mg total) by mouth every 6 (six) hours as needed. 07/23/17   Evalee Jefferson, PA-C    Family History Family History  Problem Relation Age of Onset  . Stroke Mother   . Stroke Other     Social History Social History   Tobacco Use  . Smoking status: Never Smoker  . Smokeless tobacco: Never Used  Substance Use Topics  . Alcohol use: No  . Drug use: No     Allergies   Patient has no known allergies.   Review of Systems Review of  Systems  Constitutional: Negative for appetite change, chills and fever.  HENT: Positive for congestion and rhinorrhea. Negative for sore throat and trouble swallowing.   Respiratory: Positive for cough and wheezing. Negative for chest tightness and shortness of breath.   Cardiovascular: Negative for chest pain.  Gastrointestinal: Negative for abdominal pain, nausea and vomiting.  Genitourinary: Negative for dysuria and flank pain.  Musculoskeletal: Negative for arthralgias.  Skin: Negative for rash.  Neurological: Negative for dizziness, weakness and numbness.  Hematological: Negative for adenopathy.     Physical Exam Updated Vital Signs BP (!) 153/81 (BP Location: Right Arm)   Pulse 91   Temp 98 F (36.7 C) (Oral)   Resp 18   Ht 5\' 2"  (1.575 m)   Wt 78.5 kg   SpO2 100%   BMI 31.64 kg/m   Physical Exam Vitals signs and nursing note reviewed.  Constitutional:      General: She is not in acute distress.     Appearance: Normal appearance. She is well-developed. She is not ill-appearing or toxic-appearing.  HENT:     Right Ear: Tympanic membrane and ear canal normal.     Left Ear: Tympanic membrane and ear canal normal.     Nose: No rhinorrhea.     Mouth/Throat:     Mouth: Mucous membranes are moist.     Pharynx: Uvula midline. No oropharyngeal exudate.  Eyes:     Pupils: Pupils are equal, round, and reactive to light.  Neck:     Musculoskeletal: Full passive range of motion without pain, normal range of motion and neck supple.     Trachea: Phonation normal.  Cardiovascular:     Rate and Rhythm: Normal rate and regular rhythm.     Heart sounds: Normal heart sounds. No murmur.  Pulmonary:     Effort: Pulmonary effort is normal. No respiratory distress.     Breath sounds: No stridor. Wheezing present. No rales.     Comments: Few scattered expiratory wheezes w/o rales.  Pt able to speak in full sentences w/o respiratory distress.   Chest:     Chest wall: No tenderness.  Abdominal:     General: There is no distension.     Palpations: Abdomen is soft.     Tenderness: There is no abdominal tenderness.  Lymphadenopathy:     Cervical: No cervical adenopathy.  Skin:    General: Skin is warm.     Capillary Refill: Capillary refill takes less than 2 seconds.     Findings: No rash.  Neurological:     General: No focal deficit present.     Mental Status: She is alert.     Sensory: No sensory deficit.     Motor: No weakness or abnormal muscle tone.     Gait: Gait is intact.     Comments: CN II-XII grossly intact  Psychiatric:        Mood and Affect: Mood normal.      ED Treatments / Results  Labs (all labs ordered are listed, but only abnormal results are displayed) Labs Reviewed - No data to display  EKG None  Radiology Dg Chest Portable 1 View  Result Date: 09/11/2018 CLINICAL DATA:  68 year old female with history of shortness of breath this morning. EXAM: PORTABLE CHEST 1 VIEW  COMPARISON:  Chest x-ray 04/01/2018. FINDINGS: Lung volumes are normal. No consolidative airspace disease. No pleural effusions. No pneumothorax. No pulmonary nodule or mass noted. Pulmonary vasculature and the cardiomediastinal silhouette are within normal limits. IMPRESSION:  No radiographic evidence of acute cardiopulmonary disease. Electronically Signed   By: Vinnie Langton M.D.   On: 09/11/2018 09:52    Procedures Procedures (including critical care time)  Medications Ordered in ED Medications  predniSONE (DELTASONE) tablet 40 mg (has no administration in time range)  albuterol (VENTOLIN HFA) 108 (90 Base) MCG/ACT inhaler 2 puff (has no administration in time range)     Initial Impression / Assessment and Plan / ED Course  I have reviewed the triage vital signs and the nursing notes.  Pertinent labs & imaging results that were available during my care of the patient were reviewed by me and considered in my medical decision making (see chart for details).        Pt is well appearing, vitals reviewed.  No CP, respiratory distress or dyspnea.  Mild expiratory wheezes on exam that improved after using albuterol MDI. Pt has hx of asthma, here requesting refill of albuterol vials.  Clinical suspicion for PE is low.    Pt appears appropriate for d/c home. Strict return precautions discussed.  Final Clinical Impressions(s) / ED Diagnoses   Final diagnoses:  Mild intermittent asthma without complication    ED Discharge Orders    None       Kem Parkinson, PA-C 09/11/18 2056    Francine Graven, DO 09/13/18 [phone]

## 2018-09-11 NOTE — ED Triage Notes (Signed)
Pt reports slightly productive cough x 2 days with yellow sputum and some mild wheezing. Uses albuterol at home.

## 2018-12-24 DIAGNOSIS — I10 Essential (primary) hypertension: Secondary | ICD-10-CM | POA: Diagnosis not present

## 2018-12-24 DIAGNOSIS — R7309 Other abnormal glucose: Secondary | ICD-10-CM | POA: Diagnosis not present

## 2018-12-24 DIAGNOSIS — J4520 Mild intermittent asthma, uncomplicated: Secondary | ICD-10-CM | POA: Diagnosis not present

## 2018-12-24 DIAGNOSIS — G4730 Sleep apnea, unspecified: Secondary | ICD-10-CM | POA: Diagnosis not present

## 2019-04-23 DIAGNOSIS — J4520 Mild intermittent asthma, uncomplicated: Secondary | ICD-10-CM | POA: Diagnosis not present

## 2019-04-23 DIAGNOSIS — E785 Hyperlipidemia, unspecified: Secondary | ICD-10-CM | POA: Diagnosis not present

## 2019-04-23 DIAGNOSIS — R7309 Other abnormal glucose: Secondary | ICD-10-CM | POA: Diagnosis not present

## 2019-04-23 DIAGNOSIS — I10 Essential (primary) hypertension: Secondary | ICD-10-CM | POA: Diagnosis not present

## 2019-04-23 DIAGNOSIS — G4730 Sleep apnea, unspecified: Secondary | ICD-10-CM | POA: Diagnosis not present

## 2019-04-29 DIAGNOSIS — Z1231 Encounter for screening mammogram for malignant neoplasm of breast: Secondary | ICD-10-CM | POA: Diagnosis not present

## 2019-08-27 DIAGNOSIS — J4520 Mild intermittent asthma, uncomplicated: Secondary | ICD-10-CM | POA: Diagnosis not present

## 2019-08-27 DIAGNOSIS — I10 Essential (primary) hypertension: Secondary | ICD-10-CM | POA: Diagnosis not present

## 2019-08-27 DIAGNOSIS — G4730 Sleep apnea, unspecified: Secondary | ICD-10-CM | POA: Diagnosis not present

## 2019-09-02 DIAGNOSIS — E7849 Other hyperlipidemia: Secondary | ICD-10-CM | POA: Diagnosis not present

## 2019-09-02 DIAGNOSIS — J4520 Mild intermittent asthma, uncomplicated: Secondary | ICD-10-CM | POA: Diagnosis not present

## 2019-09-02 DIAGNOSIS — I270 Primary pulmonary hypertension: Secondary | ICD-10-CM | POA: Diagnosis not present

## 2019-09-14 ENCOUNTER — Emergency Department (HOSPITAL_COMMUNITY)
Admission: EM | Admit: 2019-09-14 | Discharge: 2019-09-14 | Disposition: A | Discharge status: Home / Self Care | Payer: Medicare Other | Attending: Emergency Medicine | Admitting: Emergency Medicine

## 2019-09-14 ENCOUNTER — Other Ambulatory Visit: Payer: Self-pay

## 2019-09-14 ENCOUNTER — Emergency Department (HOSPITAL_COMMUNITY): Payer: Medicare Other

## 2019-09-14 ENCOUNTER — Encounter (HOSPITAL_COMMUNITY): Payer: Self-pay

## 2019-09-14 DIAGNOSIS — Z7951 Long term (current) use of inhaled steroids: Secondary | ICD-10-CM | POA: Diagnosis not present

## 2019-09-14 DIAGNOSIS — J4541 Moderate persistent asthma with (acute) exacerbation: Secondary | ICD-10-CM | POA: Diagnosis not present

## 2019-09-14 DIAGNOSIS — I10 Essential (primary) hypertension: Secondary | ICD-10-CM | POA: Diagnosis not present

## 2019-09-14 DIAGNOSIS — R05 Cough: Secondary | ICD-10-CM | POA: Diagnosis not present

## 2019-09-14 DIAGNOSIS — R0602 Shortness of breath: Secondary | ICD-10-CM | POA: Diagnosis not present

## 2019-09-14 DIAGNOSIS — J45909 Unspecified asthma, uncomplicated: Secondary | ICD-10-CM | POA: Insufficient documentation

## 2019-09-14 MED ORDER — PREDNISONE 10 MG PO TABS
20.0000 mg | ORAL_TABLET | Freq: Two times a day (BID) | ORAL | 0 refills | Status: DC
Start: 2019-09-14 — End: 2021-05-21

## 2019-09-14 MED ORDER — ALBUTEROL SULFATE HFA 108 (90 BASE) MCG/ACT IN AERS
4.0000 | INHALATION_SPRAY | Freq: Once | RESPIRATORY_TRACT | Status: AC
  Administered 2019-09-14: 4 via RESPIRATORY_TRACT
  Filled 2019-09-14: qty 6.7

## 2019-09-14 MED ORDER — BENZONATATE 100 MG PO CAPS
100.0000 mg | ORAL_CAPSULE | Freq: Three times a day (TID) | ORAL | 0 refills | Status: DC
Start: 2019-09-14 — End: 2021-05-21

## 2019-09-14 MED ORDER — PREDNISONE 20 MG PO TABS
20.0000 mg | ORAL_TABLET | Freq: Once | ORAL | Status: AC
  Administered 2019-09-14: 20 mg via ORAL
  Filled 2019-09-14: qty 1

## 2019-09-14 NOTE — ED Provider Notes (Signed)
[facility] EMERGENCY DEPARTMENT Provider Note   CSN: [id number] Arrival date & time: 09/14/19  4650     History Chief Complaint  Patient presents with  . Cough    Sheri Fleming is a 69 y.o. female.  Patient is a 69 year old female with history of asthma and hypertension.  She presents today with complaints of shortness of breath and wheezing.  This has worsened over the past several days.  She denies any fevers or chills.  She denies productive cough.  Patient has an nebulizer at home which she has been using with some relief.  The history is provided by the patient.  Cough Cough characteristics:  Non-productive Severity:  Moderate Duration:  3 days Timing:  Constant Progression:  Worsening Chronicity:  New Relieved by:  Nothing Worsened by:  Nothing      Past Medical History:  Diagnosis Date  . Asthma   . High cholesterol   . Hypertension   . Sleep apnea     There are no problems to display for this patient.   Past Surgical History:  Procedure Laterality Date  . ABDOMINAL HYSTERECTOMY    . FRACTURE SURGERY       OB History    Gravida  4   Para  4   Term  4   Preterm      AB      Living  4     SAB      TAB      Ectopic      Multiple      Live Births              Family History  Problem Relation Age of Onset  . Stroke Mother   . Stroke Other     Social History   Tobacco Use  . Smoking status: Never Smoker  . Smokeless tobacco: Never Used  Substance Use Topics  . Alcohol use: No  . Drug use: No    Home Medications Prior to Admission medications   Medication Sig Start Date End Date Taking? Authorizing Provider  albuterol (PROVENTIL HFA;VENTOLIN HFA) 108 (90 Base) MCG/ACT inhaler Inhale 1-2 puffs into the lungs every 6 (six) hours as needed for wheezing or shortness of breath. 12/23/16   Long, Wonda Olds, MD  albuterol (PROVENTIL) (2.5 MG/3ML) 0.083% nebulizer solution Take 3 mLs (2.5 mg total) by nebulization every 6  (six) hours as needed for wheezing or shortness of breath. 09/11/18   Triplett, Tammy, PA-C  alendronate (FOSAMAX) 70 MG tablet Take one tablet by mouth once every Thursday 10/22/16   [provider]  benzonatate (TESSALON) 200 MG capsule Take 1 capsule (200 mg total) by mouth 3 (three) times daily as needed for cough. Swallow whole, do not chew 09/11/18   Triplett, Tammy, PA-C  lisinopril-hydrochlorothiazide (PRINZIDE,ZESTORETIC) 20-25 MG per tablet Take 1 tablet by mouth daily. 03/08/14   Triplett, Tammy, PA-C  montelukast (SINGULAIR) 10 MG tablet Take 1 tablet (10 mg total) by mouth at bedtime. 01/16/16   Evalee Jefferson, PA-C  predniSONE (DELTASONE) 20 MG tablet Take 2 tablets (40 mg total) by mouth daily. 09/11/18   Triplett, Tammy, PA-C  simvastatin (ZOCOR) 20 MG tablet Take 20 mg by mouth every evening.    [provider]  traMADol (ULTRAM) 50 MG tablet Take 1 tablet (50 mg total) by mouth every 6 (six) hours as needed. 07/23/17   Evalee Jefferson, PA-C    Allergies    Patient has no known allergies.  Review of Systems   Review of Systems  Respiratory: Positive for cough.   All other systems reviewed and are negative.   Physical Exam Updated Vital Signs BP 139/79 (BP Location: Right Arm)   Pulse 86   Temp 98 F (36.7 C) (Oral)   Resp 18   Ht 5\' 2"  (1.575 m)   Wt 76.7 kg   SpO2 100%   BMI 30.91 kg/m   Physical Exam Vitals and nursing note reviewed.  Constitutional:      General: She is not in acute distress.    Appearance: She is well-developed. She is not diaphoretic.  HENT:     Head: Normocephalic and atraumatic.  Cardiovascular:     Rate and Rhythm: Normal rate and regular rhythm.     Heart sounds: No murmur heard.  No friction rub. No gallop.   Pulmonary:     Effort: Pulmonary effort is normal. No respiratory distress.     Breath sounds: Normal breath sounds. No wheezing.  Abdominal:     General: Bowel sounds are normal. There is no distension.     Palpations:  Abdomen is soft.     Tenderness: There is no abdominal tenderness.  Musculoskeletal:        General: Normal range of motion.     Cervical back: Normal range of motion and neck supple.  Skin:    General: Skin is warm and dry.  Neurological:     Mental Status: She is alert and oriented to person, place, and time.     ED Results / Procedures / Treatments   Labs (all labs ordered are listed, but only abnormal results are displayed) Labs Reviewed - No data to display  EKG EKG Interpretation  Date/Time:  Wednesday September 14 2019 08:12:23 EDT Ventricular Rate:  90 PR Interval:    QRS Duration: 92 QT Interval:  375 QTC Calculation: 459 R Axis:   68 Text Interpretation: Sinus rhythm Atrial premature complex Confirmed by Veryl Speak [phone]) on 09/14/2019 8:53:42 AM   Radiology DG Chest Portable 1 View  Result Date: 09/14/2019 CLINICAL DATA:  Cough EXAM: PORTABLE CHEST 1 VIEW COMPARISON:  09/11/2018 chest radiograph and prior. FINDINGS: No focal airspace opacity. No pneumothorax or pleural effusion. Elevated left hemidiaphragm, unchanged. Cardiomediastinal silhouette within normal limits. No acute osseous abnormality. Multilevel spondylosis. IMPRESSION: No focal airspace disease. Electronically Signed   By: Primitivo Gauze M.D.   On: 09/14/2019 08:35    Procedures Procedures (including critical care time)  Medications Ordered in ED Medications  albuterol (VENTOLIN HFA) 108 (90 Base) MCG/ACT inhaler 4 puff (has no administration in time range)    ED Course  I have reviewed the triage vital signs and the nursing notes.  Pertinent labs & imaging results that were available during my care of the patient were reviewed by me and considered in my medical decision making (see chart for details).    MDM Rules/Calculators/A&P  Patient presenting with complaints of wheezing and cough.  She has history of asthma and believes this is a flareup of this.  She has had both Covid  vaccines and denies any fever or Covid exposures.  This feels similar to her asthma exacerbations is experienced in the past.  Patient given albuterol here in the ER along with prednisone.  She will be discharged with prednisone, continued use of her nebulizer, and Tessalon.  Final Clinical Impression(s) / ED Diagnoses Final diagnoses:  None    Rx / DC Orders ED Discharge Orders  None       Veryl Speak, MD 09/14/19 [phone]

## 2019-09-14 NOTE — ED Triage Notes (Signed)
Pt reports has asthma and has had cough and sob since Tuesday.  Denies fever.  Reports has had both covid shots.

## 2019-09-14 NOTE — Discharge Instructions (Signed)
Begin taking prednisone as prescribed.  Use Tessalon as prescribed as needed for cough.  Return to the ER if you develop severe chest pain, difficulty breathing, or other new and concerning symptoms

## 2019-09-14 NOTE — ED Notes (Signed)
Nurse taught pt how to correctly use a inhaler.

## 2019-10-20 ENCOUNTER — Encounter (HOSPITAL_COMMUNITY): Payer: Self-pay

## 2019-10-20 NOTE — Therapy (Signed)
[facility] [place], [place], [address] Phone: [phone]   Fax:  [phone]  Patient Details  Name: Sheri Fleming MRN: [id number] Date of Birth: [birth year] Referring Provider:  No ref. provider found  Encounter Date: 10/20/2019 OCCUPATIONAL THERAPY DISCHARGE SUMMARY  Visits from Start of Care: 4  Current functional level related to goals / functional outcomes: Patient stopped therapy services due to clinic closure due to pandemic and did not return once clinic re-opened.  OT SHORT TERM GOAL #1   Title  Pt will be provided with HEP to improve mobility in RUE required for use as dominant during ADL completion.     Time  4    Period  Weeks    Status  On-going    Target Date  05/02/18        OT SHORT TERM GOAL #2   Title  Pt will decrease RUE fascial restrictions to minimal amounts or less to improve mobility required for functional reaching tasks.     Time  4    Period  Weeks    Status  On-going        OT SHORT TERM GOAL #3   Title  Pt will decrease pain in RUE to 3/10 or less to improve ability to wash/fix hair.     Time  4    Period  Weeks    Status  On-going        OT SHORT TERM GOAL #4   Title  Pt will improve RUE A/ROM to WNL to increase ability to reach into overhead cabinets and behind back.     Time  4    Period  Weeks    Status  On-going        OT SHORT TERM GOAL #5   Title  Pt will improve RUE strength to 4+/5 or greater to increase ability to perform housekeeping tasks at home and for work.     Time  4    Period  Weeks    Status  On-going       Remaining deficits: All deficits remain. Increased pain, fascial restrictions and decreased strength and ROM.    Education / Equipment: Shoulder HEP Plan: Patient agrees to discharge.  Patient goals were not met. Patient is being discharged due to meeting the stated rehab goals.  ?????         Ailene Ravel,  OTR/L,CBIS  [phone]  10/20/2019, 1:42 PM  [facility] [address] [place], [place], [address] Phone: [phone]   Fax:  [phone]

## 2019-10-28 IMAGING — CR PORTABLE CHEST - 1 VIEW
1 series · 1 of 1 positions shown · non-contrast
Comparison: Chest x-ray 04/01/2018.

CLINICAL DATA: 67-year-old female with history of shortness of
breath this morning.

EXAM:
PORTABLE CHEST 1 VIEW

[portable]
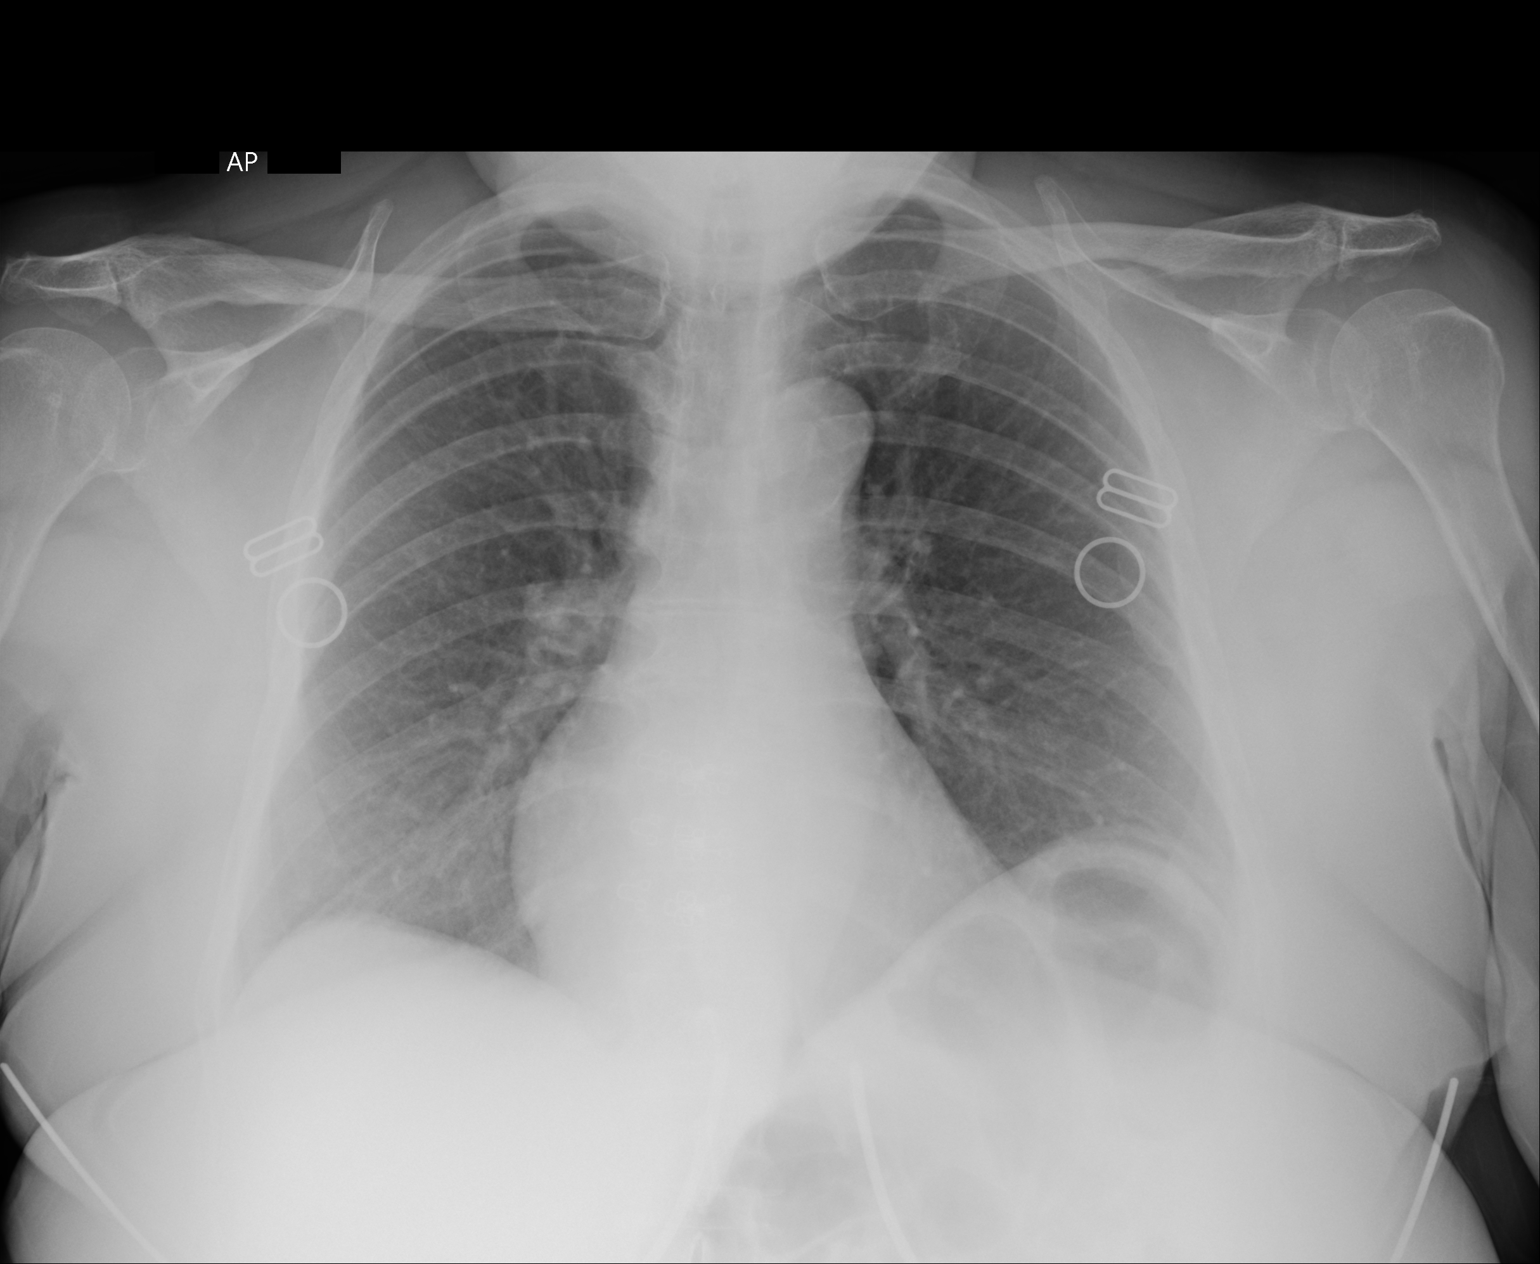

[1 of 1 positions shown; findings below may reference images not displayed]

FINDINGS: Lung volumes are normal. No consolidative airspace disease. No
pleural effusions. No pneumothorax. No pulmonary nodule or mass
noted. Pulmonary vasculature and the cardiomediastinal silhouette
are within normal limits.
IMPRESSION: No radiographic evidence of acute cardiopulmonary disease.

## 2019-12-28 DIAGNOSIS — I10 Essential (primary) hypertension: Secondary | ICD-10-CM | POA: Diagnosis not present

## 2019-12-28 DIAGNOSIS — Z23 Encounter for immunization: Secondary | ICD-10-CM | POA: Diagnosis not present

## 2019-12-28 DIAGNOSIS — G4730 Sleep apnea, unspecified: Secondary | ICD-10-CM | POA: Diagnosis not present

## 2019-12-28 DIAGNOSIS — R7309 Other abnormal glucose: Secondary | ICD-10-CM | POA: Diagnosis not present

## 2020-02-03 DIAGNOSIS — J4520 Mild intermittent asthma, uncomplicated: Secondary | ICD-10-CM | POA: Diagnosis not present

## 2020-02-03 DIAGNOSIS — E7849 Other hyperlipidemia: Secondary | ICD-10-CM | POA: Diagnosis not present

## 2020-02-03 DIAGNOSIS — I270 Primary pulmonary hypertension: Secondary | ICD-10-CM | POA: Diagnosis not present

## 2020-04-02 DIAGNOSIS — J4520 Mild intermittent asthma, uncomplicated: Secondary | ICD-10-CM | POA: Diagnosis not present

## 2020-04-02 DIAGNOSIS — I270 Primary pulmonary hypertension: Secondary | ICD-10-CM | POA: Diagnosis not present

## 2020-04-02 DIAGNOSIS — E7849 Other hyperlipidemia: Secondary | ICD-10-CM | POA: Diagnosis not present

## 2020-04-09 DIAGNOSIS — Z1231 Encounter for screening mammogram for malignant neoplasm of breast: Secondary | ICD-10-CM | POA: Diagnosis not present

## 2020-04-12 DIAGNOSIS — R7309 Other abnormal glucose: Secondary | ICD-10-CM | POA: Diagnosis not present

## 2020-04-12 DIAGNOSIS — I10 Essential (primary) hypertension: Secondary | ICD-10-CM | POA: Diagnosis not present

## 2020-04-12 DIAGNOSIS — E7849 Other hyperlipidemia: Secondary | ICD-10-CM | POA: Diagnosis not present

## 2020-04-12 DIAGNOSIS — G4730 Sleep apnea, unspecified: Secondary | ICD-10-CM | POA: Diagnosis not present

## 2020-04-14 DIAGNOSIS — E785 Hyperlipidemia, unspecified: Secondary | ICD-10-CM | POA: Diagnosis not present

## 2020-04-14 DIAGNOSIS — I10 Essential (primary) hypertension: Secondary | ICD-10-CM | POA: Diagnosis not present

## 2020-04-14 DIAGNOSIS — E7849 Other hyperlipidemia: Secondary | ICD-10-CM | POA: Diagnosis not present

## 2020-04-14 DIAGNOSIS — J4520 Mild intermittent asthma, uncomplicated: Secondary | ICD-10-CM | POA: Diagnosis not present

## 2020-05-03 DIAGNOSIS — E7849 Other hyperlipidemia: Secondary | ICD-10-CM | POA: Diagnosis not present

## 2020-05-03 DIAGNOSIS — J4520 Mild intermittent asthma, uncomplicated: Secondary | ICD-10-CM | POA: Diagnosis not present

## 2020-05-03 DIAGNOSIS — I270 Primary pulmonary hypertension: Secondary | ICD-10-CM | POA: Diagnosis not present

## 2020-08-25 DIAGNOSIS — M8589 Other specified disorders of bone density and structure, multiple sites: Secondary | ICD-10-CM | POA: Diagnosis not present

## 2020-08-28 DIAGNOSIS — E1151 Type 2 diabetes mellitus with diabetic peripheral angiopathy without gangrene: Secondary | ICD-10-CM | POA: Diagnosis not present

## 2020-08-28 DIAGNOSIS — M79672 Pain in left foot: Secondary | ICD-10-CM | POA: Diagnosis not present

## 2020-08-28 DIAGNOSIS — M79671 Pain in right foot: Secondary | ICD-10-CM | POA: Diagnosis not present

## 2020-08-28 DIAGNOSIS — M79675 Pain in left toe(s): Secondary | ICD-10-CM | POA: Diagnosis not present

## 2020-08-28 DIAGNOSIS — M79674 Pain in right toe(s): Secondary | ICD-10-CM | POA: Diagnosis not present

## 2020-09-02 DIAGNOSIS — E7849 Other hyperlipidemia: Secondary | ICD-10-CM | POA: Diagnosis not present

## 2020-09-02 DIAGNOSIS — J4520 Mild intermittent asthma, uncomplicated: Secondary | ICD-10-CM | POA: Diagnosis not present

## 2020-09-02 DIAGNOSIS — I270 Primary pulmonary hypertension: Secondary | ICD-10-CM | POA: Diagnosis not present

## 2020-09-08 DIAGNOSIS — E785 Hyperlipidemia, unspecified: Secondary | ICD-10-CM | POA: Diagnosis not present

## 2020-09-08 DIAGNOSIS — R7309 Other abnormal glucose: Secondary | ICD-10-CM | POA: Diagnosis not present

## 2020-09-08 DIAGNOSIS — E7849 Other hyperlipidemia: Secondary | ICD-10-CM | POA: Diagnosis not present

## 2020-09-08 DIAGNOSIS — I10 Essential (primary) hypertension: Secondary | ICD-10-CM | POA: Diagnosis not present

## 2020-09-11 DIAGNOSIS — I10 Essential (primary) hypertension: Secondary | ICD-10-CM | POA: Diagnosis not present

## 2020-09-11 DIAGNOSIS — J4520 Mild intermittent asthma, uncomplicated: Secondary | ICD-10-CM | POA: Diagnosis not present

## 2020-09-11 DIAGNOSIS — E7849 Other hyperlipidemia: Secondary | ICD-10-CM | POA: Diagnosis not present

## 2020-09-11 DIAGNOSIS — I270 Primary pulmonary hypertension: Secondary | ICD-10-CM | POA: Diagnosis not present

## 2020-10-20 DIAGNOSIS — E1169 Type 2 diabetes mellitus with other specified complication: Secondary | ICD-10-CM | POA: Diagnosis not present

## 2020-10-20 DIAGNOSIS — I10 Essential (primary) hypertension: Secondary | ICD-10-CM | POA: Diagnosis not present

## 2020-11-02 DIAGNOSIS — E7849 Other hyperlipidemia: Secondary | ICD-10-CM | POA: Diagnosis not present

## 2020-11-02 DIAGNOSIS — I270 Primary pulmonary hypertension: Secondary | ICD-10-CM | POA: Diagnosis not present

## 2020-11-02 DIAGNOSIS — J4520 Mild intermittent asthma, uncomplicated: Secondary | ICD-10-CM | POA: Diagnosis not present

## 2021-01-02 DIAGNOSIS — E7849 Other hyperlipidemia: Secondary | ICD-10-CM | POA: Diagnosis not present

## 2021-01-02 DIAGNOSIS — I270 Primary pulmonary hypertension: Secondary | ICD-10-CM | POA: Diagnosis not present

## 2021-01-02 DIAGNOSIS — J4520 Mild intermittent asthma, uncomplicated: Secondary | ICD-10-CM | POA: Diagnosis not present

## 2021-02-09 DIAGNOSIS — I10 Essential (primary) hypertension: Secondary | ICD-10-CM | POA: Diagnosis not present

## 2021-02-09 DIAGNOSIS — E7849 Other hyperlipidemia: Secondary | ICD-10-CM | POA: Diagnosis not present

## 2021-02-11 DIAGNOSIS — L609 Nail disorder, unspecified: Secondary | ICD-10-CM | POA: Diagnosis not present

## 2021-02-11 DIAGNOSIS — M79675 Pain in left toe(s): Secondary | ICD-10-CM | POA: Diagnosis not present

## 2021-02-11 DIAGNOSIS — B351 Tinea unguium: Secondary | ICD-10-CM | POA: Diagnosis not present

## 2021-02-11 DIAGNOSIS — M79671 Pain in right foot: Secondary | ICD-10-CM | POA: Diagnosis not present

## 2021-02-11 DIAGNOSIS — M79674 Pain in right toe(s): Secondary | ICD-10-CM | POA: Diagnosis not present

## 2021-02-11 DIAGNOSIS — M79672 Pain in left foot: Secondary | ICD-10-CM | POA: Diagnosis not present

## 2021-03-24 ENCOUNTER — Emergency Department (HOSPITAL_COMMUNITY)
Admission: EM | Admit: 2021-03-24 | Discharge: 2021-03-24 | Disposition: A | Discharge status: Home / Self Care | Payer: Medicare Other | Attending: Emergency Medicine | Admitting: Emergency Medicine

## 2021-03-24 ENCOUNTER — Other Ambulatory Visit: Payer: Self-pay

## 2021-03-24 ENCOUNTER — Emergency Department (HOSPITAL_COMMUNITY): Payer: Medicare Other

## 2021-03-24 ENCOUNTER — Encounter (HOSPITAL_COMMUNITY): Payer: Self-pay

## 2021-03-24 DIAGNOSIS — Z79899 Other long term (current) drug therapy: Secondary | ICD-10-CM | POA: Insufficient documentation

## 2021-03-24 DIAGNOSIS — S8992XA Unspecified injury of left lower leg, initial encounter: Secondary | ICD-10-CM | POA: Diagnosis present

## 2021-03-24 DIAGNOSIS — M1711 Unilateral primary osteoarthritis, right knee: Secondary | ICD-10-CM | POA: Diagnosis not present

## 2021-03-24 DIAGNOSIS — S86911A Strain of unspecified muscle(s) and tendon(s) at lower leg level, right leg, initial encounter: Secondary | ICD-10-CM | POA: Diagnosis not present

## 2021-03-24 DIAGNOSIS — X58XXXA Exposure to other specified factors, initial encounter: Secondary | ICD-10-CM | POA: Diagnosis not present

## 2021-03-24 DIAGNOSIS — S86912A Strain of unspecified muscle(s) and tendon(s) at lower leg level, left leg, initial encounter: Secondary | ICD-10-CM | POA: Diagnosis not present

## 2021-03-24 DIAGNOSIS — M25561 Pain in right knee: Secondary | ICD-10-CM | POA: Diagnosis not present

## 2021-03-24 MED ORDER — NAPROXEN 500 MG PO TABS: 500.0000 mg | ORAL_TABLET | Freq: Two times a day (BID) | ORAL | 0 refills | Status: DC

## 2021-03-24 NOTE — ED Provider Notes (Signed)
[facility] EMERGENCY DEPARTMENT Provider Note   CSN: [id number] Arrival date & time: 03/24/21  1058     History  Chief Complaint  Patient presents with   Knee Pain    Sheri Fleming is a 71 y.o. female presenting with a 1 week history of medial right knee pain.  She denies injury or overuse, reports a mildly progressive pain along the medial knee joint, worse with standing and better at rest but has been able to ambulate. She denies fevers, swelling, redness, radiation of pain.  She has used topical Ephraim Hamburger with improvement in her symptoms.  The history is provided by the patient.      Home Medications Prior to Admission medications   Medication Sig Start Date End Date Taking? Authorizing Provider  naproxen (NAPROSYN) 500 MG tablet Take 1 tablet (500 mg total) by mouth 2 (two) times daily. 03/24/21  Yes Avyn Aden, Almyra Free, PA-C  albuterol (PROVENTIL HFA;VENTOLIN HFA) 108 (90 Base) MCG/ACT inhaler Inhale 1-2 puffs into the lungs every 6 (six) hours as needed for wheezing or shortness of breath. 12/23/16   Long, Wonda Olds, MD  albuterol (PROVENTIL) (2.5 MG/3ML) 0.083% nebulizer solution Take 3 mLs (2.5 mg total) by nebulization every 6 (six) hours as needed for wheezing or shortness of breath. 09/11/18   Triplett, Tammy, PA-C  alendronate (FOSAMAX) 70 MG tablet Take one tablet by mouth once every Thursday 10/22/16   [provider]  benzonatate (TESSALON) 100 MG capsule Take 1 capsule (100 mg total) by mouth every 8 (eight) hours. 09/14/19   Veryl Speak, MD  lisinopril-hydrochlorothiazide (PRINZIDE,ZESTORETIC) 20-25 MG per tablet Take 1 tablet by mouth daily. 03/08/14   Triplett, Tammy, PA-C  montelukast (SINGULAIR) 10 MG tablet Take 1 tablet (10 mg total) by mouth at bedtime. 01/16/16   Evalee Jefferson, PA-C  predniSONE (DELTASONE) 10 MG tablet Take 2 tablets (20 mg total) by mouth 2 (two) times daily. 09/14/19   Veryl Speak, MD  simvastatin (ZOCOR) 20 MG tablet Take 20 mg by mouth every  evening.    [provider]  traMADol (ULTRAM) 50 MG tablet Take 1 tablet (50 mg total) by mouth every 6 (six) hours as needed. 07/23/17   Evalee Jefferson, PA-C      Allergies    Patient has no known allergies.    Review of Systems   Review of Systems  Constitutional:  Negative for chills and fever.  Cardiovascular:  Negative for leg swelling.  Musculoskeletal:  Positive for arthralgias. Negative for joint swelling and myalgias.  Skin: Negative.   Neurological:  Negative for weakness and numbness.   Physical Exam Updated Vital Signs BP (!) 156/80 (BP Location: Right Arm)    Pulse 78    Temp 98.2 F (36.8 C)    Resp 18    Ht 5\' 2"  (1.575 m)    Wt 72.6 kg    SpO2 99%    BMI 29.26 kg/m  Physical Exam Constitutional:      Appearance: She is well-developed.  HENT:     Head: Atraumatic.  Cardiovascular:     Comments: Pulses equal bilaterally Musculoskeletal:        General: Tenderness present. No swelling or deformity.     Cervical back: Normal range of motion.     Right knee: Bony tenderness present. No swelling, effusion or erythema.     Comments: Tender to palpation right medial meniscus, no crepitus with ROM, no palpable deformity  Skin:    General: Skin is warm  and dry.     Findings: No erythema.  Neurological:     Mental Status: She is alert.     Sensory: No sensory deficit.     Motor: No weakness.     Deep Tendon Reflexes: Reflexes normal.    ED Results / Procedures / Treatments   Labs (all labs ordered are listed, but only abnormal results are displayed) Labs Reviewed - No data to display  EKG None  Radiology DG Knee Complete 4 Views Right  Result Date: 03/24/2021 CLINICAL DATA:  Right knee pain for a week, worse with activity EXAM: RIGHT KNEE - COMPLETE 4+ VIEW COMPARISON:  None. FINDINGS: No fracture or dislocation of the right knee. Mild medial compartment joint space loss without significant osteophytosis. No knee joint effusion. Soft tissues are  unremarkable. IMPRESSION: No fracture or dislocation of the right knee. Mild medial compartment joint space loss without significant osteophytosis. No knee joint effusion. Electronically Signed   By: Delanna Ahmadi M.D.   On: 03/24/2021 13:42    Procedures Procedures    Medications Ordered in ED Medications - No data to display  ED Course/ Medical Decision Making/ A&P                           Medical Decision Making Right knee pain x 1 week, no trauma.  DDX including strain/sprain, septic joint, djd, gout.  No erythema ,effusion or edema, infection or inflammatory process less likely.    Amount and/or Complexity of Data Reviewed Radiology: ordered.    Details: xray reviewed, djd present medial joint space,  Risk Prescription drug management. Risk Details: Djd chronic in nature, although may have just progressed to the point where it is now becoming symptomatic.  Possible pt may have also strained the joint without awareness causing this acute pain.  She was encouraged activity as tolerated, heat tx, continue ben gay since this does offer sx relief, naproxen prescribed.  Referral to ortho for f/u care if sx persist or worsen.            Final Clinical Impression(s) / ED Diagnoses Final diagnoses:  Strain of right knee, initial encounter  Primary osteoarthritis of right knee    Rx / DC Orders ED Discharge Orders          Ordered    naproxen (NAPROSYN) 500 MG tablet  2 times daily        03/24/21 1419              Landis Martins 03/25/21 1626    Luna Fuse, MD 03/31/21 1935

## 2021-03-24 NOTE — ED Triage Notes (Signed)
Patient complaining of right knee pain for the past week. Denies any injury. Able to ambulate.

## 2021-03-24 NOTE — Discharge Instructions (Addendum)
Your x-ray does show a very very mild arthritis at your medial knee joint space which corresponds to the area of point.  However given the sudden onset of your symptoms I really suspect your pain is more related to a strain at the site.  I recommend using a heating pad for 20 minutes several times daily, use the medication prescribed to help with pain and inflammation as well.  You may continue using your Bengay if it is helpful.  Plan follow-up with Dr. Aline Brochure if this does not improve your pain symptoms.

## 2021-04-02 DIAGNOSIS — J4520 Mild intermittent asthma, uncomplicated: Secondary | ICD-10-CM | POA: Diagnosis not present

## 2021-04-02 DIAGNOSIS — E7849 Other hyperlipidemia: Secondary | ICD-10-CM | POA: Diagnosis not present

## 2021-04-02 DIAGNOSIS — I270 Primary pulmonary hypertension: Secondary | ICD-10-CM | POA: Diagnosis not present

## 2021-05-04 DIAGNOSIS — Z1231 Encounter for screening mammogram for malignant neoplasm of breast: Secondary | ICD-10-CM | POA: Diagnosis not present

## 2021-05-16 DIAGNOSIS — N6321 Unspecified lump in the left breast, upper outer quadrant: Secondary | ICD-10-CM | POA: Diagnosis not present

## 2021-05-16 DIAGNOSIS — R928 Other abnormal and inconclusive findings on diagnostic imaging of breast: Secondary | ICD-10-CM | POA: Diagnosis not present

## 2021-05-21 ENCOUNTER — Telehealth: Payer: Self-pay | Admitting: Nurse Practitioner

## 2021-05-21 ENCOUNTER — Encounter: Payer: Self-pay | Admitting: Nurse Practitioner

## 2021-05-21 ENCOUNTER — Ambulatory Visit (INDEPENDENT_AMBULATORY_CARE_PROVIDER_SITE_OTHER): Payer: Medicare Other | Admitting: Nurse Practitioner

## 2021-05-21 DIAGNOSIS — I10 Essential (primary) hypertension: Secondary | ICD-10-CM | POA: Diagnosis not present

## 2021-05-21 DIAGNOSIS — E785 Hyperlipidemia, unspecified: Secondary | ICD-10-CM

## 2021-05-21 DIAGNOSIS — J4520 Mild intermittent asthma, uncomplicated: Secondary | ICD-10-CM

## 2021-05-21 DIAGNOSIS — J45909 Unspecified asthma, uncomplicated: Secondary | ICD-10-CM | POA: Insufficient documentation

## 2021-05-21 DIAGNOSIS — M810 Age-related osteoporosis without current pathological fracture: Secondary | ICD-10-CM | POA: Diagnosis not present

## 2021-05-21 NOTE — Telephone Encounter (Signed)
Patient last PCP in [place]  ? ?[facility]. ?

## 2021-05-21 NOTE — Patient Instructions (Signed)

## 2021-05-21 NOTE — Progress Notes (Signed)
? ?New Patient Office Visit ? ?Subjective   ? ?Patient ID: Sheri Fleming, female    DOB: Sep 03, 1950  Age: 71 y.o. MRN: [id number] ? ?CC:  ?Chief Complaint  ?Patient presents with  ? New Patient (Initial Visit)  ?  NP  ? ? ?HPI ?Lyndel Pleasure with past medical history of asthma, osteoporosis, hypertension, hyperlipidemia presents to establish care for her chronic medical conditions. Previous PCP Dr. Criss Rosales. Last visit was in November 2022.  ? ?Asthma, currently not taking albuterol inhaler as her condition has been well controlled on  montelukast 10 mg daily, .  Denies wheezing, cough, shortness of breath, does not smoke.  ? ?Hypertension.  On lisinopril-hydrochlorothiazide 20-25 mg tablet daily, denies dizziness , CP palpitation, syncope.  ?.  ?Had recent mammogram , stated that she has cyst in her left breast, has upcoming appointment tomorrow with radiology for further testing.  ? ?States that she has Cologuard done last year, will obtain records from her previous PCP.  Diarrhea abdominal pain, bloody stool, nausea vomiting ? ?States that she is up to date with shingles vaccine, pneumonia vaccine, TDAP states that she had her vaccines at [facility] in [place]. Due for covid booster  ? ?Last DEXA scan was one year, takes fosamax $RemoveBefor'70mg'[id number]$  once weekly.  ? ? ? ?Outpatient Encounter Medications as of 05/21/2021  ?Medication Sig  ? albuterol (PROVENTIL HFA;VENTOLIN HFA) 108 (90 Base) MCG/ACT inhaler Inhale 1-2 puffs into the lungs every 6 (six) hours as needed for wheezing or shortness of breath.  ? albuterol (PROVENTIL) (2.5 MG/3ML) 0.083% nebulizer solution Take 3 mLs (2.5 mg total) by nebulization every 6 (six) hours as needed for wheezing or shortness of breath.  ? alendronate (FOSAMAX) 70 MG tablet Take one tablet by mouth once every Thursday  ? lisinopril-hydrochlorothiazide (PRINZIDE,ZESTORETIC) 20-25 MG per tablet Take 1 tablet by mouth daily.  ? montelukast (SINGULAIR) 10 MG tablet Take 1 tablet (10 mg total)  by mouth at bedtime.  ? simvastatin (ZOCOR) 20 MG tablet Take 20 mg by mouth every evening.  ? [DISCONTINUED] benzonatate (TESSALON) 100 MG capsule Take 1 capsule (100 mg total) by mouth every 8 (eight) hours. (Patient not taking: Reported on 05/21/2021)  ? [DISCONTINUED] naproxen (NAPROSYN) 500 MG tablet Take 1 tablet (500 mg total) by mouth 2 (two) times daily. (Patient not taking: Reported on 05/21/2021)  ? [DISCONTINUED] predniSONE (DELTASONE) 10 MG tablet Take 2 tablets (20 mg total) by mouth 2 (two) times daily. (Patient not taking: Reported on 05/21/2021)  ? [DISCONTINUED] traMADol (ULTRAM) 50 MG tablet Take 1 tablet (50 mg total) by mouth every 6 (six) hours as needed. (Patient not taking: Reported on 05/21/2021)  ? ?No facility-administered encounter medications on file as of 05/21/2021.  ? ? ?Past Medical History:  ?Diagnosis Date  ? Asthma   ? High cholesterol   ? Hypertension   ? Sleep apnea   ? ? ?Past Surgical History:  ?Procedure Laterality Date  ? ABDOMINAL HYSTERECTOMY    ? FRACTURE SURGERY    ? ? ?Family History  ?Problem Relation Age of Onset  ? Stroke Mother   ? Hypertension Mother   ? Hypertension Son   ? Stroke Son   ? Kidney failure Son   ? Hypercholesterolemia Son   ? Stroke Other   ? Colon cancer Neg Hx   ? Breast cancer Neg Hx   ? ? ?Social History  ? ?Socioeconomic History  ? Marital status: Divorced  ?  Spouse name: Not on  file  ? Number of children: 4  ? Years of education: Not on file  ? Highest education level: Not on file  ?Occupational History  ? Not on file  ?Tobacco Use  ? Smoking status: Never  ? Smokeless tobacco: Never  ?Substance and Sexual Activity  ? Alcohol use: No  ? Drug use: No  ? Sexual activity: Not Currently  ?Other Topics Concern  ? Not on file  ?Social History Narrative  ? Lives with her ex husband. Has 4 children. Retired.   ? ?Social Determinants of Health  ? ?Financial Resource Strain: Not on file  ?Food Insecurity: Not on file  ?Transportation Needs: Not on file   ?Physical Activity: Not on file  ?Stress: Not on file  ?Social Connections: Not on file  ?Intimate Partner Violence: Not on file  ? ? ?Review of Systems  ?Constitutional: Negative.   ?Respiratory: Negative.    ?Cardiovascular: Negative.   ?Neurological: Negative.   ?Psychiatric/Behavioral: Negative.    ? ?  ? ? ?Objective   ? ?BP 131/74 (BP Location: Left Arm, Patient Position: Sitting, Cuff Size: Large)   Pulse 84   Ht $R'5\' 2"'Ev$  (1.575 m)   Wt 164 lb (74.4 kg)   SpO2 99%   BMI 30.00 kg/m?  ? ?Physical Exam ?Constitutional:   ?   General: She is not in acute distress. ?   Appearance: Normal appearance. She is not ill-appearing, toxic-appearing or diaphoretic.  ?Cardiovascular:  ?   Rate and Rhythm: Normal rate and regular rhythm.  ?   Pulses: Normal pulses.  ?   Heart sounds: Normal heart sounds. No murmur heard. ?  No friction rub. No gallop.  ?Pulmonary:  ?   Effort: Pulmonary effort is normal. No respiratory distress.  ?   Breath sounds: Normal breath sounds. No stridor. No wheezing, rhonchi or rales.  ?Chest:  ?   Chest wall: No tenderness.  ?Abdominal:  ?   Palpations: Abdomen is soft.  ?Musculoskeletal:     ?   General: No swelling, tenderness, deformity or signs of injury. Normal range of motion.  ?Skin: ?   Capillary Refill: Capillary refill takes less than 2 seconds.  ?Neurological:  ?   Mental Status: She is alert and oriented to person, place, and time.  ?Psychiatric:     ?   Mood and Affect: Mood normal.     ?   Behavior: Behavior normal.     ?   Thought Content: Thought content normal.     ?   Judgment: Judgment normal.  ? ? ? ?  ? ?Assessment & Plan:  ? ?Problem List Items Addressed This Visit   ? ?  ? Cardiovascular and Mediastinum  ? High blood pressure  ?  BP Readings from Last 3 Encounters:  ?05/21/21 131/74  ?03/24/21 (!) 156/80  ?09/14/19 112/66  ?Condition well-controlled on lisinopril-hydrochlorothiazide 20-25 mg tablet daily ?Continue current medications ?DASH diet advised exercise daily as  tolerated ?Check CMP plus EGFR at next visit ?  ?  ?  ? Respiratory  ? Asthma  ?  Well controlled on singular 10 mg daily ?Has albuterol inhaler ordered but has not required medication in a while. ?Continue current medications. ? ?  ?  ?  ? Musculoskeletal and Integument  ? Osteoporosis  ?  Last DEXA scan was a year ago per patient ?Takes Fosamax 70 mg weekly, no reports of fractures  ?Continue current medication.  ? ? ?  ?  ?  ?  Other  ? Hyperlipidemia  ?  On simvastatin 20 mg daily ?Avoid fried fatty foods ?Check lipid panel at next visit. ? ?  ?  ? ? ?Return in about 4 months (around 09/20/2021) for Follw up for HTN, HLD.  ? ?Renee Rival, FNP ?High blood pressure ?BP Readings from Last 3 Encounters:  ?05/21/21 131/74  ?03/24/21 (!) 156/80  ?09/14/19 112/66  ? ?Condition well-controlled on lisinopril-hydrochlorothiazide 20-25 mg tablet daily ?Continue current medications ?DASH diet advised exercise daily as tolerated ?Check CMP plus EGFR at next visit ? ?Asthma ?Well controlled on singular 10 mg daily ?Has albuterol inhaler ordered but has not required medication in a while. ?Continue current medications. ? ?Osteoporosis ?Last DEXA scan was a year ago per patient ?Takes Fosamax 70 mg weekly, no reports of fractures  ?Continue current medication.  ? ? ?Hyperlipidemia ?On simvastatin 20 mg daily ?Avoid fried fatty foods ?Check lipid panel at next visit.  ? ?

## 2021-05-21 NOTE — Assessment & Plan Note (Signed)
On simvastatin 20 mg daily ?Avoid fried fatty foods ?Check lipid panel at next visit. ?

## 2021-05-21 NOTE — Telephone Encounter (Signed)
FYI

## 2021-05-21 NOTE — Assessment & Plan Note (Signed)
BP Readings from Last 3 Encounters:  ?05/21/21 131/74  ?03/24/21 (!) 156/80  ?09/14/19 112/66  ? ?Condition well-controlled on lisinopril-hydrochlorothiazide 20-25 mg tablet daily ?Continue current medications ?DASH diet advised exercise daily as tolerated ?Check CMP plus EGFR at next visit ?

## 2021-05-21 NOTE — Assessment & Plan Note (Signed)
Last DEXA scan was a year ago per patient ?Takes Fosamax 70 mg weekly, no reports of fractures  ?Continue current medication.  ? ?

## 2021-05-21 NOTE — Assessment & Plan Note (Signed)
Well controlled on singular 10 mg daily ?Has albuterol inhaler ordered but has not required medication in a while. ?Continue current medications. ?

## 2021-05-22 DIAGNOSIS — C801 Malignant (primary) neoplasm, unspecified: Secondary | ICD-10-CM

## 2021-05-22 DIAGNOSIS — C50412 Malignant neoplasm of upper-outer quadrant of left female breast: Secondary | ICD-10-CM | POA: Diagnosis not present

## 2021-05-22 HISTORY — DX: Malignant (primary) neoplasm, unspecified: C80.1

## 2021-05-23 ENCOUNTER — Encounter: Payer: Self-pay | Admitting: Family Medicine

## 2021-05-24 ENCOUNTER — Telehealth: Payer: Self-pay | Admitting: Hematology and Oncology

## 2021-05-24 ENCOUNTER — Other Ambulatory Visit: Payer: Self-pay | Admitting: *Deleted

## 2021-05-24 ENCOUNTER — Encounter: Payer: Self-pay | Admitting: *Deleted

## 2021-05-24 DIAGNOSIS — Z171 Estrogen receptor negative status [ER-]: Secondary | ICD-10-CM | POA: Insufficient documentation

## 2021-05-24 NOTE — Telephone Encounter (Signed)
Called patient to give appointment information for 4/26, no answer and patiens mailbox is full ?

## 2021-05-28 ENCOUNTER — Telehealth: Payer: Self-pay | Admitting: Hematology and Oncology

## 2021-05-28 NOTE — Telephone Encounter (Signed)
Spoke to patient to confirm afternoon clinic appointment for 4/26, [facility] sent package, patient said she will bring with her ?

## 2021-05-28 NOTE — Progress Notes (Signed)
[facility] ?CONSULT NOTE ? ?Patient Care Team: ?Renee Rival, FNP as PCP - General (Nurse Practitioner) ?Mauro Kaufmann, RN as Oncology Nurse Navigator ?Rockwell Germany, RN as Oncology Nurse Navigator ?Jovita Kussmaul, MD as Consulting Physician (General Surgery) ?Nicholas Lose, MD as Consulting Physician (Hematology and Oncology) ?Kyung Rudd, MD as Consulting Physician (Radiation Oncology) ? ?CHIEF COMPLAINTS/PURPOSE OF CONSULTATION:  ?Newly diagnosed breast cancer ? ?HISTORY OF PRESENTING ILLNESS:  ?Sheri Fleming 71 y.o. female is here because of recent diagnosis of left breast cancer ? ?Screening mammogram detected left breast mass.Diagnostic mammogram showed  1.3 cm irregular mass with a macrolobulated margin in the left breast upper outer aspect middle depth. Biopsy showed: an irregular mass with an angular margin in the left breast at  2 o'clock middle depth 9 cm from the nipple. This irregular mass is hypoechoic and echogenic boundary. Prognostic indicators significant for ER negative; PR negative, Her2 status negative; Grade 3.  ?  ? ?I reviewed her records extensively and collaborated the history with the patient. ? ?SUMMARY OF ONCOLOGIC HISTORY: ?Oncology History  ?Malignant neoplasm of upper-outer quadrant of left breast in female, estrogen receptor negative ([place])  ?05/24/2021 Initial Diagnosis  ? Screening mammogram detected left UOQ mass 1.5 cm at 2 o'clock position, axilla negative, ultrasound biopsy: Grade 3 IDC ER 0%, PR 0%, HER2 equivocal, FISH pending, Ki-67 40% ?  ?05/29/2021 Cancer Staging  ? Staging form: Breast, AJCC 8th Edition ?- Clinical: Stage IB (cT1c, cN0, cM0, G3, ER-, PR-, HER2: Equivocal) - Signed by Nicholas Lose, MD on 05/29/2021 ?Stage prefix: Initial diagnosis ?Histologic grading system: 3 grade system ? ?  ? ? ? ?MEDICAL HISTORY:  ?Past Medical History:  ?Diagnosis Date  ? Asthma   ? High cholesterol   ? Hypertension   ? Sleep apnea   ? ? ?SURGICAL  HISTORY: ?Past Surgical History:  ?Procedure Laterality Date  ? ABDOMINAL HYSTERECTOMY    ? FRACTURE SURGERY    ? ? ?SOCIAL HISTORY: ?Social History  ? ?Socioeconomic History  ? Marital status: Divorced  ?  Spouse name: Not on file  ? Number of children: 4  ? Years of education: Not on file  ? Highest education level: Not on file  ?Occupational History  ? Not on file  ?Tobacco Use  ? Smoking status: Never  ? Smokeless tobacco: Never  ?Substance and Sexual Activity  ? Alcohol use: No  ? Drug use: No  ? Sexual activity: Not Currently  ?Other Topics Concern  ? Not on file  ?Social History Narrative  ? Lives with her ex husband. Has 4 children. Retired.   ? ?Social Determinants of Health  ? ?Financial Resource Strain: Not on file  ?Food Insecurity: Not on file  ?Transportation Needs: Not on file  ?Physical Activity: Not on file  ?Stress: Not on file  ?Social Connections: Not on file  ?Intimate Partner Violence: Not on file  ? ? ?FAMILY HISTORY: ?Family History  ?Problem Relation Age of Onset  ? Stroke Mother   ? Hypertension Mother   ? Hypertension Son   ? Stroke Son   ? Kidney failure Son   ? Hypercholesterolemia Son   ? Stroke Other   ? Colon cancer Neg Hx   ? Breast cancer Neg Hx   ? ? ?ALLERGIES:  has No Known Allergies. ? ?MEDICATIONS:  ?Current Outpatient Medications  ?Medication Sig Dispense Refill  ? albuterol (PROVENTIL HFA;VENTOLIN HFA) 108 (90 Base) MCG/ACT inhaler Inhale 1-2 puffs  into the lungs every 6 (six) hours as needed for wheezing or shortness of breath. 1 Inhaler 0  ? albuterol (PROVENTIL) (2.5 MG/3ML) 0.083% nebulizer solution Take 3 mLs (2.5 mg total) by nebulization every 6 (six) hours as needed for wheezing or shortness of breath. 75 mL 1  ? alendronate (FOSAMAX) 70 MG tablet Take one tablet by mouth once every Thursday  3  ? lisinopril-hydrochlorothiazide (PRINZIDE,ZESTORETIC) 20-25 MG per tablet Take 1 tablet by mouth daily. 30 tablet 0  ? montelukast (SINGULAIR) 10 MG tablet Take 1 tablet (10  mg total) by mouth at bedtime. 30 tablet 0  ? simvastatin (ZOCOR) 20 MG tablet Take 20 mg by mouth every evening.    ? ?No current facility-administered medications for this visit.  ? ? ?REVIEW OF SYSTEMS:   ?Constitutional: Denies fevers, chills or abnormal night sweats ? Breast:  Denies any palpable lumps or discharge ?All other systems were reviewed with the patient and are negative. ? ?PHYSICAL EXAMINATION: ?ECOG PERFORMANCE STATUS: 0 - Asymptomatic ? ?Vitals:  ? 05/29/21 1239  ?BP: (!) 155/73  ?Pulse: 83  ?Resp: 18  ?Temp: 97.8 ?F (36.6 ?C)  ?SpO2: 100%  ? ?Filed Weights  ? 05/29/21 1239  ?Weight: 160 lb 8 oz (72.8 kg)  ? ?  ? ?LABORATORY DATA:  ?I have reviewed the data as listed ?Lab Results  ?Component Value Date  ? WBC 6.5 05/29/2021  ? HGB 12.2 05/29/2021  ? HCT 36.1 05/29/2021  ? MCV 92.1 05/29/2021  ? PLT 295 05/29/2021  ? ?Lab Results  ?Component Value Date  ? NA 138 05/29/2021  ? K 3.7 05/29/2021  ? CL 103 05/29/2021  ? CO2 29 05/29/2021  ? ? ?RADIOGRAPHIC STUDIES: ?I have personally reviewed the radiological reports and agreed with the findings in the report. ? ?ASSESSMENT AND PLAN:  ?Malignant neoplasm of upper-outer quadrant of left breast in female, estrogen receptor negative ([place]) ?05/24/2021:Screening mammogram detected left UOQ mass 1.5 cm at 2 o'clock position, axilla negative, ultrasound biopsy: Grade 3 IDC ER 0%, PR 0%, HER2 equivocal, FISH pending, Ki-67 40% ? ?Pathology and radiology counseling: Discussed with the patient, the details of pathology including the type of breast cancer,the clinical staging, the significance of ER, PR and HER-2/neu receptors and the implications for treatment. After reviewing the pathology in detail, we proceeded to discuss the different treatment options between surgery, radiation, chemotherapy, antiestrogen therapies. ? ?Treatment plan: ?1.  Breast conserving surgery with sentinel lymph node biopsy ?2. adjuvant chemotherapy with Taxotere and Cytoxan every 3  weeks x4 ?3.  Adjuvant radiation ?Patient is willing to participate in the blood draw study with exact sciences ?Genetics and port placement ? ?Chemotherapy Counseling: I discussed the risks and benefits of chemotherapy including the risks of nausea/ vomiting, risk of infection from low WBC count, fatigue due to chemo or anemia, bruising or bleeding due to low platelets, mouth sores, loss/ change in taste and decreased appetite. Liver and kidney function will be monitored through out chemotherapy as abnormalities in liver and kidney function may be a side effect of treatment.  Neuropathy risk with Taxotere was discussed in detail. Risk of permanent bone marrow dysfunction and leukemia due to chemo were also discussed. ? ? ?Return to clinic after surgery to discuss final pathology report and to finalize her adjuvant treatment plan ? ? ?All questions were answered. The patient knows to call the clinic with any problems, questions or concerns. ?  ? Harriette Ohara, MD ?05/29/21 ? I Deritra,  [place] am scribing for Dr. Lindi Adie ? ?I have reviewed the above documentation for accuracy and completeness, and I agree with the above. ?  ?

## 2021-05-29 ENCOUNTER — Encounter: Payer: Self-pay | Admitting: *Deleted

## 2021-05-29 ENCOUNTER — Encounter: Payer: Self-pay | Admitting: Emergency Medicine

## 2021-05-29 ENCOUNTER — Ambulatory Visit (HOSPITAL_BASED_OUTPATIENT_CLINIC_OR_DEPARTMENT_OTHER): Payer: Medicare Other | Admitting: Genetic Counselor

## 2021-05-29 ENCOUNTER — Encounter: Payer: Self-pay | Admitting: Physical Therapy

## 2021-05-29 ENCOUNTER — Encounter: Payer: Self-pay | Admitting: Hematology and Oncology

## 2021-05-29 ENCOUNTER — Other Ambulatory Visit: Payer: Self-pay

## 2021-05-29 ENCOUNTER — Ambulatory Visit: Payer: Self-pay | Admitting: General Surgery

## 2021-05-29 ENCOUNTER — Inpatient Hospital Stay: Payer: Medicare Other | Attending: Hematology and Oncology

## 2021-05-29 ENCOUNTER — Inpatient Hospital Stay (HOSPITAL_BASED_OUTPATIENT_CLINIC_OR_DEPARTMENT_OTHER): Payer: Medicare Other | Admitting: Hematology and Oncology

## 2021-05-29 ENCOUNTER — Ambulatory Visit
Admission: RE | Admit: 2021-05-29 | Discharge: 2021-05-29 | Disposition: A | Discharge status: Home / Self Care | Payer: Medicare Other | Source: Ambulatory Visit | Attending: Radiation Oncology | Admitting: Radiation Oncology

## 2021-05-29 ENCOUNTER — Ambulatory Visit: Payer: Medicare Other | Attending: General Surgery | Admitting: Physical Therapy

## 2021-05-29 DIAGNOSIS — Z170 Estrogen receptor positive status [ER+]: Secondary | ICD-10-CM | POA: Insufficient documentation

## 2021-05-29 DIAGNOSIS — Z171 Estrogen receptor negative status [ER-]: Secondary | ICD-10-CM

## 2021-05-29 DIAGNOSIS — I10 Essential (primary) hypertension: Secondary | ICD-10-CM | POA: Insufficient documentation

## 2021-05-29 DIAGNOSIS — R293 Abnormal posture: Secondary | ICD-10-CM | POA: Insufficient documentation

## 2021-05-29 DIAGNOSIS — Z90710 Acquired absence of both cervix and uterus: Secondary | ICD-10-CM | POA: Insufficient documentation

## 2021-05-29 DIAGNOSIS — C50412 Malignant neoplasm of upper-outer quadrant of left female breast: Secondary | ICD-10-CM | POA: Diagnosis not present

## 2021-05-29 LAB — CBC WITH DIFFERENTIAL (CANCER CENTER ONLY)
Abs Immature Granulocytes: 0.01 10*3/uL (ref 0.00–0.07)
Basophils Absolute: 0.1 10*3/uL (ref 0.0–0.1)
Basophils Relative: 2 %
Eosinophils Absolute: 0.1 10*3/uL (ref 0.0–0.5)
Eosinophils Relative: 1 %
HCT: 36.1 % (ref 36.0–46.0)
Hemoglobin: 12.2 g/dL (ref 12.0–15.0)
Immature Granulocytes: 0 %
Lymphocytes Relative: 47 %
Lymphs Abs: 3 10*3/uL (ref 0.7–4.0)
MCH: 31.1 pg (ref 26.0–34.0)
MCHC: 33.8 g/dL (ref 30.0–36.0)
MCV: 92.1 fL (ref 80.0–100.0)
Monocytes Absolute: 0.6 10*3/uL (ref 0.1–1.0)
Monocytes Relative: 10 %
Neutro Abs: 2.6 10*3/uL (ref 1.7–7.7)
Neutrophils Relative %: 40 %
Platelet Count: 295 10*3/uL (ref 150–400)
RBC: 3.92 MIL/uL (ref 3.87–5.11)
RDW: 12.9 % (ref 11.5–15.5)
WBC Count: 6.5 10*3/uL (ref 4.0–10.5)
nRBC: 0 % (ref 0.0–0.2)

## 2021-05-29 LAB — CMP (CANCER CENTER ONLY)
ALT: 15 U/L (ref 0–44)
AST: 19 U/L (ref 15–41)
Albumin: 4.4 g/dL (ref 3.5–5.0)
Alkaline Phosphatase: 32 U/L — ABNORMAL LOW (ref 38–126)
Anion gap: 6 (ref 5–15)
BUN: 16 mg/dL (ref 8–23)
CO2: 29 mmol/L (ref 22–32)
Calcium: 9.6 mg/dL (ref 8.9–10.3)
Chloride: 103 mmol/L (ref 98–111)
Creatinine: 0.79 mg/dL (ref 0.44–1.00)
GFR, Estimated: >60 mL/min (ref >60)
Glucose, Bld: 91 mg/dL (ref 70–99)
Potassium: 3.7 mmol/L (ref 3.5–5.1)
Sodium: 138 mmol/L (ref 135–145)
Total Bilirubin: 0.7 mg/dL (ref 0.3–1.2)
Total Protein: 7.5 g/dL (ref 6.5–8.1)

## 2021-05-29 LAB — GENETIC SCREENING ORDER

## 2021-05-29 NOTE — Assessment & Plan Note (Signed)
05/24/2021:Screening mammogram detected left UOQ mass 1.5 cm at 2 o'clock position, axilla negative, ultrasound biopsy: Grade 3 IDC ER 0%, PR 0%, HER2 equivocal, FISH pending, Ki-67 40% ? ?Pathology and radiology counseling: Discussed with the patient, the details of pathology including the type of breast cancer,the clinical staging, the significance of ER, PR and HER-2/neu receptors and the implications for treatment. After reviewing the pathology in detail, we proceeded to discuss the different treatment options between surgery, radiation, chemotherapy, antiestrogen therapies. ? ?Treatment plan: ?1.  Breast conserving surgery with sentinel lymph node biopsy ?2. adjuvant chemotherapy (whether it is triple negative or HER2 positive she will need adjuvant chemo) ?3.  Adjuvant radiation ? ?Chemo counseling: Chemo would be Taxol Herceptin if it is HER2 positive followed by Herceptin maintenance for 1 year versus Taxotere and Cytoxan every 3 weeks x4 for triple negative breast cancer ? ?Return to clinic after surgery to discuss final pathology report and to finalize her adjuvant treatment plan ?

## 2021-05-29 NOTE — Therapy (Signed)
?OUTPATIENT PHYSICAL THERAPY BREAST CANCER BASELINE EVALUATION ? ? ?Patient Name: Sheri Fleming ?MRN: [id number] ?DOB:[birth year], 71 y.o., female ?Today's Date: 05/29/2021 ? ? PT End of Session - 05/29/21 1519   ? ? Visit Number 1   ? Number of Visits 2   ? Date for PT Re-Evaluation 07/24/21   ? PT Start Time 1402   ? PT Stop Time 1436   ? PT Time Calculation (min) 34 min   ? Activity Tolerance Patient tolerated treatment well   ? Behavior During Therapy [facility] for tasks assessed/performed   ? ?  ?  ? ?  ? ? ?Past Medical History:  ?Diagnosis Date  ? Asthma   ? High cholesterol   ? Hypertension   ? Sleep apnea   ? ?Past Surgical History:  ?Procedure Laterality Date  ? ABDOMINAL HYSTERECTOMY    ? FRACTURE SURGERY    ? ?Patient Active Problem List  ? Diagnosis Date Noted  ? Malignant neoplasm of upper-outer quadrant of left breast in female, estrogen receptor negative ([place]) 05/24/2021  ? High blood pressure 05/21/2021  ? Asthma 05/21/2021  ? Osteoporosis 05/21/2021  ? Hyperlipidemia 05/21/2021  ? ? ?PCP: Renee Rival, FNP ? ?REFERRING PROVIDER: Jovita Kussmaul, MD ? ?REFERRING DIAG: Left breast cancer ? ?THERAPY DIAG:  ?Malignant neoplasm of upper-outer quadrant of left breast in female, estrogen receptor positive ([place]) ? ?Abnormal posture ? ?ONSET DATE: 05/04/2021 ? ?SUBJECTIVE                                                                                                                                                                                          ? ?SUBJECTIVE STATEMENT: ?Patient reports she is here today to be seen by her medical team for her newly diagnosed left breast cancer.  ? ?PERTINENT HISTORY:  ?Patient was diagnosed on 05/04/2021 with left grade III invasive ductal carcinoma breast cancer. It measures 1.5 cm and is located in the upper outer quadrant. It is triple negative with a Ki67 of 40%.  ? ?PATIENT GOALS   reduce lymphedema risk and learn post op HEP.  ? ?PAIN:  ?Are you having pain?  No ? ? ?PRECAUTIONS: Active CA None ? ?HAND DOMINANCE: right ? ?WEIGHT BEARING RESTRICTIONS No ? ?FALLS:  ?Has patient fallen in last 6 months? No ? ?LIVING ENVIRONMENT: ?Patient lives with: her husband ?Lives in: House/apartment ?Has following equipment at home: None ? ?OCCUPATION: Full time packer ? ?LEISURE: She walks 2x/week for 30-45 min ? ?PRIOR LEVEL OF FUNCTION: Independent ? ? ?OBJECTIVE ? ?COGNITION: ? Overall cognitive status: Within functional limits for tasks assessed   ? ?POSTURE:  ?  Forward head and rounded shoulders posture ? ?UPPER EXTREMITY AROM/PROM: ? ?A/PROM RIGHT  05/29/2021 ?  ?Shoulder extension 42  ?Shoulder flexion 158  ?Shoulder abduction 156  ?Shoulder internal rotation 68  ?Shoulder external rotation 68  ?  (Blank rows = not tested) ? ?A/PROM LEFT  05/29/2021  ?Shoulder extension 45  ?Shoulder flexion 148  ?Shoulder abduction 161  ?Shoulder internal rotation 80  ?Shoulder external rotation 62  ?  (Blank rows = not tested) ? ? ?CERVICAL AROM: ?All within normal limits ? ?UPPER EXTREMITY STRENGTH: WNL ? ? ?LYMPHEDEMA ASSESSMENTS:  ? ?LANDMARK RIGHT  05/29/2021  ?10 cm proximal to olecranon process 30.2  ?Olecranon process 24.5  ?10 cm proximal to ulnar styloid process 23.9  ?Just proximal to ulnar styloid process 15.7  ?Across hand at thumb web space 18.5  ?At base of 2nd digit 6.2  ?(Blank rows = not tested) ? ?[place] LEFT  05/29/2021  ?10 cm proximal to olecranon process 30.9  ?Olecranon process 24.8  ?10 cm proximal to ulnar styloid process 23.5  ?Just proximal to ulnar styloid process 16.3  ?Across hand at thumb web space 18.2  ?At base of 2nd digit 6.2  ?(Blank rows = not tested) ? ? ?L-DEX LYMPHEDEMA SCREENING: ? ?The patient was assessed using the L-Dex machine today to produce a lymphedema index baseline score. The patient will be reassessed on a regular basis (typically every 3 months) to obtain new L-Dex scores. If the score is > 6.5 points away from his/her baseline score  indicating onset of subclinical lymphedema, it will be recommended to wear a compression garment for 4 weeks, 12 hours per day and then be reassessed. If the score continues to be > 6.5 points from baseline at reassessment, we will initiate lymphedema treatment. Assessing in this manner has a 95% rate of preventing clinically significant lymphedema. ? ? L-DEX FLOWSHEETS - 05/29/21 1500   ? ?  ? L-DEX LYMPHEDEMA SCREENING  ? Measurement Type Unilateral   ? L-DEX MEASUREMENT EXTREMITY Upper Extremity   ? POSITION  Standing   ? DOMINANT SIDE Right   ? At Risk Side Left   ? BASELINE SCORE (UNILATERAL) 6.4   ? ?  ?  ? ?  ? ? ? ?QUICK DASH SURVEY: ? Katina Dung - 05/29/21 0001   ? ? Open a tight or new jar Mild difficulty   ? Do heavy household chores (wash walls, wash floors) No difficulty   ? Carry a shopping bag or briefcase No difficulty   ? Wash your back Moderate difficulty   ? Use a knife to cut food No difficulty   ? During the past week, to what extent has your arm, shoulder or hand problem interfered with your normal social activities with family, friends, neighbors, or groups? Slightly   ? During the past week, to what extent has your arm, shoulder or hand problem limited your work or other regular daily activities Slightly   ? Arm, shoulder, or hand pain. None   ? Tingling (pins and needles) in your arm, shoulder, or hand None   ? Difficulty Sleeping No difficulty   ? DASH Score 9.09 %   ? ?  ?  ? ?  ? ? ? ?PATIENT EDUCATION:  ?Education details: Lymphedema risk reduction and post op shoulder/posture HEP ?Person educated: Patient ?Education method: Explanation, Demonstration, Handout ?Education comprehension: Patient verbalized understanding and returned demonstration ? ? ?HOME EXERCISE PROGRAM: ?Patient was instructed today in a home exercise program today  for post op shoulder range of motion. These included active assist shoulder flexion in sitting, scapular retraction, wall walking with shoulder abduction,  and hands behind head external rotation.  She was encouraged to do these twice a day, holding 3 seconds and repeating 5 times when permitted by her physician. ? ? ?ASSESSMENT: ? ?CLINICAL IMPRESSION: ?Patient was diagnosed on 05/04/2021 with left grade III invasive ductal carcinoma breast cancer. It measures 1.5 cm and is located in the upper outer quadrant. It is triple negative with a Ki67 of 40%. Her multidisciplinary medical team met prior to her assessments to determine a recommended treatment plan. She is planning to have a left lumpectomy and sentinel node biopsy followed by chemotherapy and radiation. She will benefit from a post op PT reassessment to determine needs and from L-Dex screens every 3 months for 2 years to detect subclinical lymphedema. ? ?Pt will benefit from skilled therapeutic intervention to improve on the following deficits: Decreased knowledge of precautions, impaired UE functional use, pain, decreased ROM, postural dysfunction.  ? ?PT treatment/interventions: ADL/self-care home management, pt/family education, therapeutic exercise ? ?REHAB POTENTIAL: Excellent ? ?CLINICAL DECISION MAKING: Stable/uncomplicated ? ?EVALUATION COMPLEXITY: Low ? ? ?GOALS: ?Goals reviewed with patient? YES ? ?LONG TERM GOALS: (STG=LTG) ? ? Name Target Date Goal status  ?1 Pt will be able to verbalize understanding of pertinent lymphedema risk reduction practices relevant to her dx specifically related to skin care.  ?Baseline:  No knowledge 05/29/2021 Achieved at eval  ?2 Pt will be able to return demo and/or verbalize understanding of the post op HEP related to regaining shoulder ROM. ?Baseline:  No knowledge 05/29/2021 Achieved at eval  ?3 Pt will be able to verbalize understanding of the importance of attending the post op After Breast CA Class for further lymphedema risk reduction education and therapeutic exercise.  ?Baseline:  No knowledge 05/29/2021 Achieved at eval  ?4 Pt will demo she has regained full  shoulder ROM and function post operatively compared to baselines.  ?Baseline: See objective measurements taken today. 08/23/2021   ? ? ? ?PLAN: ?PT FREQUENCY/DURATION: EVAL and 1 follow up appointment.  ? ?PLAN FOR NEXT

## 2021-05-29 NOTE — Research (Signed)
[employer] 2021-05 - Specimen Collection Study to Evaluate Biomarkers in Subjects with Cancer  ? ?05/29/21 ? ?Patient Sheri Fleming was identified by Dr. Lindi Adie as a potential candidate for the above listed study.  This Clinical Research Coordinator met with Sheri Fleming, [id number], on 05/29/21 in a manner and location that ensures patient privacy to discuss participation in the above listed research study.  Patient is Accompanied by her husband .  A copy of the informed consent document with embedded HIPAA language was provided to the patient.  Patient reads, speaks, and understands [place].   ?Patient was provided with the business card of this Coordinator and encouraged to contact the research team with any questions.  Approximately 5 minutes were spent with the patient reviewing the informed consent documents.  Patient was provided the option of taking informed consent documents home to review and was encouraged to review at their convenience with their support network, including other care providers. Patient took the consent documents home to review. ? ?The patient states she would like to decline participation in this study.  She did take home the informed consent form and this coordinator's contact information and states she will call if she changes her mind.   ? ?She was thanked for her time and consideration of this study. ? ?Clabe Seal ?Clinical Research Coordinator I  ?05/29/21 3:16 PM ?

## 2021-05-29 NOTE — Progress Notes (Signed)
?Radiation Oncology         [phone] ?________________________________ ? ?Name: Sheri Fleming        MRN: [id number]  ?Date of Service: 05/29/2021 DOB: 10-19-1950 ? ?GG:[id number], Dewaine Conger, FNP  Jovita Kussmaul, MD    ? ?REFERRING PHYSICIAN: Jovita Kussmaul, MD ? ? ?DIAGNOSIS: The encounter diagnosis was Malignant neoplasm of upper-outer quadrant of left breast in female, estrogen receptor negative ([place]). ? ? ?HISTORY OF PRESENT ILLNESS: Sheri Fleming is a 71 y.o. female seen in the multidisciplinary breast clinic for a new diagnosis of left breast cancer. The patient was noted to have a screening detected mass in the left breast in the upper outer quadrant. The mass was noted in the 2:00 position by ultrasound and measured 1.5 cm, and her axilla was negative for adenopathy. A biopsy showed a grade 3 invasive ductal carcinoma that was triple negative with a Ki 67 of 40%. She's seen today to discuss treatment recommendations of her cancer.  ? ? ? ?PREVIOUS RADIATION THERAPY: No ? ? ?PAST MEDICAL HISTORY:  ?Past Medical History:  ?Diagnosis Date  ? Asthma   ? High cholesterol   ? Hypertension   ? Sleep apnea   ?   ? ? ?PAST SURGICAL HISTORY: ?Past Surgical History:  ?Procedure Laterality Date  ? ABDOMINAL HYSTERECTOMY    ? FRACTURE SURGERY    ? ? ? ?FAMILY HISTORY:  ?Family History  ?Problem Relation Age of Onset  ? Stroke Mother   ? Hypertension Mother   ? Hypertension Son   ? Stroke Son   ? Kidney failure Son   ? Hypercholesterolemia Son   ? Stroke Other   ? Colon cancer Neg Hx   ? Breast cancer Neg Hx   ? ? ? ?SOCIAL HISTORY:  reports that she has never smoked. She has never used smokeless tobacco. She reports that she does not drink alcohol and does not use drugs. The patient is divorced and lives in [place]. 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 ? ? ?ALLERGIES: Patient has no known allergies. ? ? ?MEDICATIONS:  ?Current Outpatient Medications  ?Medication Sig  Dispense Refill  ? albuterol (PROVENTIL HFA;VENTOLIN HFA) 108 (90 Base) MCG/ACT inhaler Inhale 1-2 puffs into the lungs every 6 (six) hours as needed for wheezing or shortness of breath. 1 Inhaler 0  ? albuterol (PROVENTIL) (2.5 MG/3ML) 0.083% nebulizer solution Take 3 mLs (2.5 mg total) by nebulization every 6 (six) hours as needed for wheezing or shortness of breath. 75 mL 1  ? alendronate (FOSAMAX) 70 MG tablet Take one tablet by mouth once every Thursday  3  ? lisinopril-hydrochlorothiazide (PRINZIDE,ZESTORETIC) 20-25 MG per tablet Take 1 tablet by mouth daily. 30 tablet 0  ? montelukast (SINGULAIR) 10 MG tablet Take 1 tablet (10 mg total) by mouth at bedtime. 30 tablet 0  ? simvastatin (ZOCOR) 20 MG tablet Take 20 mg by mouth every evening.    ? ?No current facility-administered medications for this encounter.  ? ? ? ?REVIEW OF SYSTEMS: On review of systems, the patient reports that she is doing well overall. She had fullness in her breast since her biopsy but no pain and is able to lay on her side. No other complaints are verbalized.  ? ?  ? ?PHYSICAL EXAM:  ?Wt Readings from Last 3 Encounters:  ?05/29/21 160 lb 8 oz (72.8 kg)  ?05/21/21 164 lb (74.4 kg)  ?03/24/21 160 lb (72.6 kg)  ? ?  Temp Readings from Last 3 Encounters:  ?05/29/21 97.8 ?F (36.6 ?C) (Temporal)  ?03/24/21 98.2 ?F (36.8 ?C)  ?09/14/19 98 ?F (36.7 ?C) (Oral)  ? ?BP Readings from Last 3 Encounters:  ?05/29/21 (!) 155/73  ?05/21/21 131/74  ?03/24/21 (!) 156/80  ? ?Pulse Readings from Last 3 Encounters:  ?05/29/21 83  ?05/21/21 84  ?03/24/21 78  ? ? ?In general this is a well appearing African American female in no acute distress. She's alert and oriented x4 and appropriate throughout the examination. Cardiopulmonary assessment is negative for acute distress and she exhibits normal effort. Bilateral breast exam is deferred. ? ? ? ?ECOG = 1 ? ?0 - Asymptomatic (Fully active, able to carry on all predisease activities without restriction) ? ?1 -  Symptomatic but completely ambulatory (Restricted in physically strenuous activity but ambulatory and able to carry out work of a light or sedentary nature. For example, light housework, office work) ? ?2 - Symptomatic, <50% in bed during the day (Ambulatory and capable of all self care but unable to carry out any work activities. Up and about more than 50% of waking hours) ? ?3 - Symptomatic, >50% in bed, but not bedbound (Capable of only limited self-care, confined to bed or chair 50% or more of waking hours) ? ?4 - Bedbound (Completely disabled. Cannot carry on any self-care. Totally confined to bed or chair) ? ?5 - Death ? ? Oken MM, Creech RH, Tormey DC, et al. [phone]). "Toxicity and response criteria of the [facility] Group". [place] Oncol. 5 (6): 649-55 ? ? ? ?LABORATORY DATA:  ?Lab Results  ?Component Value Date  ? WBC 6.5 05/29/2021  ? HGB 12.2 05/29/2021  ? HCT 36.1 05/29/2021  ? MCV 92.1 05/29/2021  ? PLT 295 05/29/2021  ? ?Lab Results  ?Component Value Date  ? NA 138 05/29/2021  ? K 3.7 05/29/2021  ? CL 103 05/29/2021  ? CO2 29 05/29/2021  ? ?Lab Results  ?Component Value Date  ? ALT 15 05/29/2021  ? AST 19 05/29/2021  ? ALKPHOS 32 (L) 05/29/2021  ? BILITOT 0.7 05/29/2021  ? ?  ? ?RADIOGRAPHY: No results found. ?   ? ?IMPRESSION/PLAN: ?1. Stage IB, cT1cN0M0, grade 3, Triple Negative Invasive Ductal Carcinoma of the Left Breast. Dr. Lisbeth Renshaw has reviewed her pathology findings and today I reviewed  the nature of early stage triple negative breast disease. The consensus from the breast conference includes breast conservation with lumpectomy with  sentinel node biopsy. Dr. Lindi Adie recommends adjuvant chemotherapy. She would benefit from external radiotherapy to the breast  to reduce risks of local recurrence followed by antiestrogen therapy. We discussed the risks, benefits, short, and long term effects of radiotherapy, as well as the curative intent, and the patient is interested in  proceeding but would like treatment closer to home in [place]. We will refer her to Dr. Lynnette Caffey at [facility] and would be happy to see her back as needed. ?2. Possible genetic predisposition to malignancy. The patient is a candidate for genetic testing given her personal history. She was offered referral and will see genetics today.  ? ?In a visit lasting 45 minutes, greater than 50% of the time was spent face to face reviewing her case, as well as in preparation of, discussing, and coordinating the patient's care. ? ? ? ? ?Carola Rhine, PAC ? ? ? ?**Disclaimer: This note was dictated with voice recognition software. Similar sounding words can inadvertently be transcribed and this note  may contain transcription errors which may not have been corrected upon publication of note.** ?

## 2021-05-30 DIAGNOSIS — Z171 Estrogen receptor negative status [ER-]: Secondary | ICD-10-CM | POA: Diagnosis not present

## 2021-05-30 DIAGNOSIS — C50412 Malignant neoplasm of upper-outer quadrant of left female breast: Secondary | ICD-10-CM | POA: Diagnosis not present

## 2021-05-31 ENCOUNTER — Encounter: Payer: Self-pay | Admitting: Genetic Counselor

## 2021-05-31 NOTE — Progress Notes (Signed)
REFERRING PROVIDER: ?Nicholas Lose, MD ?[address] ?[place],   [place] [id number] ? ?PRIMARY PROVIDER:  ?Renee Rival, FNP ? ?PRIMARY REASON FOR VISIT:  ?1. Malignant neoplasm of upper-outer quadrant of left breast in female, estrogen receptor negative ([place])   ? ?HISTORY OF PRESENT ILLNESS:   ?Sheri Fleming, a 71 y.o. female, was seen for a [place] cancer genetics consultation during the breast multidisciplinary clinic at the request of Dr. Lindi Adie due to a personal history of triple negative breast cancer.  Sheri Fleming presents to clinic today to discuss the possibility of a hereditary predisposition to cancer, to discuss genetic testing, and to further clarify her future cancer risks, as well as potential cancer risks for family members.  ? ?In April 2023, at the age of 14, Sheri Fleming was diagnosed with invasive ductal carcinoma of the left breast (ER-/PR-/HER2-). The treatment plan breast conserving surgery, adjuvant chemotherapy, and adjuvant radiation.  ? ?CANCER HISTORY:  ?Oncology History  ?Malignant neoplasm of upper-outer quadrant of left breast in female, estrogen receptor negative ([place])  ?05/24/2021 Initial Diagnosis  ? Screening mammogram detected left UOQ mass 1.5 cm at 2 o'clock position, axilla negative, ultrasound biopsy: Grade 3 IDC ER 0%, PR 0%, HER2 equivocal, FISH pending, Ki-67 40% ?  ?05/29/2021 Cancer Staging  ? Staging form: Breast, AJCC 8th Edition ?- Clinical: Stage IB (cT1c, cN0, cM0, G3, ER-, PR-, HER2: Equivocal) - Signed by Nicholas Lose, MD on 05/29/2021 ?Stage prefix: Initial diagnosis ?Histologic grading system: 3 grade system ? ?  ? ? ? ?RISK FACTORS:  ?Colonoscopy: no; not examined. ?Menarche was at age 68.  ?First live birth at age 64.  ? ?Past Medical History:  ?Diagnosis Date  ? Asthma   ? High cholesterol   ? Hypertension   ? Sleep apnea   ? ? ?Past Surgical History:  ?Procedure Laterality Date  ? ABDOMINAL HYSTERECTOMY    ? FRACTURE SURGERY    ? ? ? ?FAMILY HISTORY:   ?We obtained a detailed, 4-generation family history.  Significant diagnoses are listed below: ?Family History  ?Problem Relation Age of Onset  ? Lung cancer Father   ?     d. 76s  ? Vaginal cancer Sister 23  ? Cancer Brother   ?     unknown type; dx 28s; mets  ? ? ? ? ?Sheri Fleming is unaware of previous family history of genetic testing for hereditary cancer risks. There is no reported Ashkenazi Jewish ancestry. There is no known consanguinity. ? ?GENETIC COUNSELING ASSESSMENT: Sheri Fleming is a 71 y.o. female with a personal history which is somewhat suggestive of a hereditary cancer syndrome and predisposition to cancer given her diagnosis of triple negative breast cancer. We, therefore, discussed and recommended the following at today's visit.  ? ?DISCUSSION: We discussed that 5 - 10% of cancer is hereditary, with most cases of hereditary breast cancer associated with mutations in BRCA1/2.  There are other genes that can be associated with hereditary breast cancer syndromes.  Type of cancer risk and level of risk are gene-specific. We discussed that testing is beneficial for several reasons including knowing how to follow individuals after completing their treatment, identifying whether potential treatment options would be beneficial, and understanding if other family members could be at risk for cancer and allowing them to undergo genetic testing.  ? ?We reviewed the characteristics, features and inheritance patterns of hereditary cancer syndromes. We also discussed genetic testing, including the appropriate family members to test, the process of  testing, insurance coverage and turn-around-time for results. We discussed the implications of a negative, positive and/or variant of uncertain significant result. In order to get genetic test results in a timely manner so that Sheri Fleming can use these genetic test results for surgical decisions, we recommended Sheri Fleming pursue genetic testing for the [employer].  The  BRCAplus panel offered by [employer] and includes sequencing and deletion/duplication analysis for the following 8 genes: ATM, BRCA1, BRCA2, CDH1, CHEK2, PALB2, PTEN, and TP53. Once complete, we recommend Sheri Fleming pursue reflex genetic testing to a more comprehensive gene panel.  ? ?The CustomNext-Cancer+RNAinsight panel offered by [facility] includes sequencing and rearrangement analysis for the following 47 genes:  APC, ATM, AXIN2, BARD1, BMPR1A, BRCA1, BRCA2, BRIP1, CDH1, CDK4, CDKN2A, CHEK2, DICER1, EPCAM, GREM1, HOXB13, MEN1, MLH1, MSH2, MSH3, MSH6, MUTYH, NBN, NF1, NF2, NTHL1, PALB2, PMS2, POLD1, POLE, PTEN, RAD51C, RAD51D, RECQL, RET, SDHA, SDHAF2, SDHB, SDHC, SDHD, SMAD4, SMARCA4, STK11, TP53, TSC1, TSC2, and VHL.  RNA data is routinely analyzed for use in variant interpretation for all genes. ? ?Based on Ms. Bruna's personal history of cancer, she meets medical criteria for genetic testing.  ? ?PLAN: After considering the risks, benefits, and limitations, Sheri Fleming provided informed consent to pursue genetic testing and the blood sample was sent to [facility] for analysis of the BRCAPlus and CustomNext-Cancer +RNAinsight Panel. Results should be available within approximately 1-2 weeks' time, at which point they will be disclosed by telephone to Sheri Fleming, as will any additional recommendations warranted by these results. Sheri Fleming will receive a summary of her genetic counseling visit and a copy of her results once available. This information will also be available in Epic.  ? ?Lastly, we encouraged Sheri Fleming to remain in contact with cancer genetics annually so that we can continuously update the family history and inform her of any changes in cancer genetics and testing that may be of benefit for this family.  ? ?Sheri Fleming's questions were answered to her satisfaction today. Our contact information was provided should additional questions or concerns arise. Thank you for the referral and  allowing [place] to share in the care of your patient.  ? ?Arayna Illescas M. Joette Catching, [place], [place] ?Genetic Counselor ?[email or web address] ?(P) [phone] ? ?The patient was seen for a total of 20 minutes in face-to-face genetic counseling. Drs. Lindi Adie and/or Burr Medico were available to discuss this case as needed.  ?_______________________________________________________________________ ?For Office Staff:  ?Number of people involved in session: 2 ?Was an Intern/ student involved with case: no ? ?

## 2021-06-06 ENCOUNTER — Telehealth: Payer: Self-pay | Admitting: *Deleted

## 2021-06-06 DIAGNOSIS — C50912 Malignant neoplasm of unspecified site of left female breast: Secondary | ICD-10-CM | POA: Diagnosis not present

## 2021-06-06 NOTE — Telephone Encounter (Signed)
Called pt regarding [facility] from 4.26.23. No answer. Mailbox full. Unable to leave vm. ?

## 2021-06-07 ENCOUNTER — Encounter: Payer: Self-pay | Admitting: General Practice

## 2021-06-07 ENCOUNTER — Other Ambulatory Visit: Payer: Self-pay

## 2021-06-07 MED ORDER — SIMVASTATIN 20 MG PO TABS: 20.0000 mg | ORAL_TABLET | Freq: Every evening | ORAL | 2 refills | Status: DC

## 2021-06-07 NOTE — Progress Notes (Signed)
[facility] Psychosocial Distress Screening ?Spiritual Care ? ?Attempted to follow up with Joycelyn Schmid by phone after Breast Multidisciplinary Clinic to introduce [place] team/resources, reviewing distress screen per protocol, but her voicemail is full.  The patient scored a  [unspecified]  on the Psychosocial Distress Thermometer which indicates  [unspecified]  distress.  ? ? 06/07/2021  ?[facility] DISTRESS SCREENING   ?Screening Type Initial Screening   ?Emotional problem type Nervousness/Anxiety   ?Referral to support programs Yes   ? ? ?Follow up needed: Yes.  Will continue to attempt to reach by phone for [facility] follow-up/pastoral check-in. ? ? ?Chaplain Lorrin Jackson, MDiv, [facility] ?Pager [phone] ?Voicemail [phone] ? ? ? ? ?  ?

## 2021-06-10 ENCOUNTER — Encounter: Payer: Self-pay | Admitting: General Practice

## 2021-06-10 DIAGNOSIS — M79675 Pain in left toe(s): Secondary | ICD-10-CM | POA: Diagnosis not present

## 2021-06-10 DIAGNOSIS — M79674 Pain in right toe(s): Secondary | ICD-10-CM | POA: Diagnosis not present

## 2021-06-10 DIAGNOSIS — M79672 Pain in left foot: Secondary | ICD-10-CM | POA: Diagnosis not present

## 2021-06-10 DIAGNOSIS — M79671 Pain in right foot: Secondary | ICD-10-CM | POA: Diagnosis not present

## 2021-06-10 DIAGNOSIS — B351 Tinea unguium: Secondary | ICD-10-CM | POA: Diagnosis not present

## 2021-06-10 DIAGNOSIS — L609 Nail disorder, unspecified: Secondary | ICD-10-CM | POA: Diagnosis not present

## 2021-06-10 NOTE — Progress Notes (Signed)
[facility]ual [facility] Note ? ?Continuing to attempt follow-up by phone, but voicemail remains full. Mailing handwritten card with my brochure today to introduce Spiritual Care as part of Ms Dunckel's support team. ? ? ?Chaplain Lorrin Jackson, MDiv, [facility] ?Pager [phone] ?Voicemail [phone]  ?

## 2021-06-11 ENCOUNTER — Encounter: Payer: Self-pay | Admitting: Genetic Counselor

## 2021-06-11 ENCOUNTER — Telehealth: Payer: Self-pay | Admitting: Genetic Counselor

## 2021-06-11 DIAGNOSIS — Z1379 Encounter for other screening for genetic and chromosomal anomalies: Secondary | ICD-10-CM | POA: Insufficient documentation

## 2021-06-11 DIAGNOSIS — Z0001 Encounter for general adult medical examination with abnormal findings: Secondary | ICD-10-CM | POA: Insufficient documentation

## 2021-06-11 NOTE — Telephone Encounter (Signed)
Contacted patient in attempt to disclose results of genetic testing.  Mailbox full and unable to LVM.  ?

## 2021-06-13 ENCOUNTER — Ambulatory Visit: Payer: Self-pay | Admitting: Genetic Counselor

## 2021-06-13 ENCOUNTER — Other Ambulatory Visit: Payer: Self-pay

## 2021-06-13 MED ORDER — SIMVASTATIN 20 MG PO TABS: 20.0000 mg | ORAL_TABLET | Freq: Every evening | ORAL | 2 refills | Status: DC

## 2021-06-13 NOTE — Telephone Encounter (Signed)
Second attempt at disclosing genetics results.  Unable to LVM.  Patient letter sent requesting call back.  ?

## 2021-06-14 ENCOUNTER — Encounter: Payer: Self-pay | Admitting: *Deleted

## 2021-06-17 NOTE — Pre-Procedure Instructions (Signed)
Surgical Instructions ? ? ? Your procedure is scheduled on Wednesday, May 24th. ? Report to [facility] Main Entrance "A" at 06:30 A.M., then check in with the Admitting office. ? Call this number if you have problems the morning of surgery: ? [phone] ? ? If you have any questions prior to your surgery date call [phone]: Open Monday-Friday 8am-4pm ? ? ? Remember: ? Do not eat after midnight the night before your surgery ? ?You may drink clear liquids until 05:30 AM the morning of your surgery.   ?Clear liquids allowed are: Water, Non-Citrus Juices (without pulp), Carbonated Beverages, Clear Tea, Black Coffee Only (NO MILK, CREAM OR POWDERED CREAMER of any kind), and Gatorade. ?  ? Take these medicines the morning of surgery with A SIP OF WATER  ?albuterol (PROVENTIL HFA;VENTOLIN HFA)- if needed- bring inhaler with you on day of surgery ?albuterol (PROVENTIL) (2.5 MG/3ML) 0.083% nebulizer solution- if needed ? ? ?As of today, STOP taking any Aspirin (unless otherwise instructed by your surgeon) Aleve, Naproxen, Ibuprofen, Motrin, Advil, Goody's, BC's, all herbal medications, fish oil, and all vitamins. ?         ?           ?Do NOT Smoke (Tobacco/Vaping) for 24 hours prior to your procedure. ? ?If you use a CPAP at night, you may bring your mask/headgear for your overnight stay. ?  ?Contacts, glasses, piercing's, hearing aid's, dentures or partials may not be worn into surgery, please bring cases for these belongings.  ?  ?For patients admitted to the hospital, discharge time will be determined by your treatment team. ?  ?Patients discharged the day of surgery will not be allowed to drive home, and someone needs to stay with them for 24 hours. ? ?SURGICAL WAITING ROOM VISITATION ?Patients having surgery or a procedure may have two support people in the waiting room. These visitors may be switched out with other visitors if needed. ?Children under the age of 1 must have an adult accompany them who is not the  patient. ?If the patient needs to stay at the hospital during part of their recovery, the visitor guidelines for inpatient rooms apply. ? ?Please refer to the [facility] website for the visitor guidelines for Inpatients (after your surgery is over and you are in a regular room).  ? ? ?Special instructions:   ?[facility]- Preparing For Surgery ? ?Before surgery, you can play an important role. Because skin is not sterile, your skin needs to be as free of germs as possible. You can reduce the number of germs on your skin by washing with CHG (chlorahexidine gluconate) Soap before surgery.  CHG is an antiseptic cleaner which kills germs and bonds with the skin to continue killing germs even after washing.   ? ?Oral Hygiene is also important to reduce your risk of infection.  Remember - BRUSH YOUR TEETH THE MORNING OF SURGERY WITH YOUR REGULAR TOOTHPASTE ? ?Please do not use if you have an allergy to CHG or antibacterial soaps. If your skin becomes reddened/irritated stop using the CHG.  ?Do not shave (including legs and underarms) for at least 48 hours prior to first CHG shower. It is OK to shave your face. ? ?Please follow these instructions carefully. ?  ?Shower the NIGHT BEFORE SURGERY and the MORNING OF SURGERY ? ?If you chose to wash your hair, wash your hair first as usual with your normal shampoo. ? ?After you shampoo, rinse your hair and body thoroughly to remove the  shampoo. ? ?Use CHG Soap as you would any other liquid soap. You can apply CHG directly to the skin and wash gently with a scrungie or a clean washcloth.  ? ?Apply the CHG Soap to your body ONLY FROM THE NECK DOWN.  Do not use on open wounds or open sores. Avoid contact with your eyes, ears, mouth and genitals (private parts). Wash Face and genitals (private parts)  with your normal soap.  ? ?Wash thoroughly, paying special attention to the area where your surgery will be performed. ? ?Thoroughly rinse your body with warm water from the neck  down. ? ?DO NOT shower/wash with your normal soap after using and rinsing off the CHG Soap. ? ?Pat yourself dry with a CLEAN TOWEL. ? ?Wear CLEAN PAJAMAS to bed the night before surgery ? ?Place CLEAN SHEETS on your bed the night before your surgery ? ?DO NOT SLEEP WITH PETS. ? ? ?Day of Surgery: ?Take a shower with CHG soap. ?Do not wear jewelry or makeup ?Do not wear lotions, powders, perfumes, or deodorant. ?Do not shave 48 hours prior to surgery.   ?Do not bring valuables to the hospital.  ?[facility] is not responsible for any belongings or valuables. ?Do not wear nail polish, gel polish, artificial nails, or any other type of covering on natural nails (fingers and toes) ?If you have artificial nails or gel coating that need to be removed by a nail salon, please have this removed prior to surgery. Artificial nails or gel coating may interfere with anesthesia's ability to adequately monitor your vital signs. ?Wear Clean/Comfortable clothing the morning of surgery ?Remember to brush your teeth WITH YOUR REGULAR TOOTHPASTE. ?  ?Please read over the following fact sheets that you were given. ? ? ? ?If you received a COVID test during your pre-op visit  it is requested that you wear a mask when out in public, stay away from anyone that may not be feeling well and notify your surgeon if you develop symptoms. If you have been in contact with anyone that has tested positive in the last 10 days please notify you surgeon.  ?

## 2021-06-18 ENCOUNTER — Other Ambulatory Visit: Payer: Self-pay

## 2021-06-18 ENCOUNTER — Encounter (HOSPITAL_COMMUNITY)
Admission: RE | Admit: 2021-06-18 | Discharge: 2021-06-18 | Disposition: A | Discharge status: Home / Self Care | Payer: Medicare Other | Source: Ambulatory Visit | Attending: General Surgery | Admitting: General Surgery

## 2021-06-18 ENCOUNTER — Encounter (HOSPITAL_COMMUNITY): Payer: Self-pay

## 2021-06-18 VITALS — BP 144/74 | HR 89 | Temp 98.3°F | Resp 17 | Ht 62.0 in | Wt 162.3 lb

## 2021-06-18 DIAGNOSIS — I2510 Atherosclerotic heart disease of native coronary artery without angina pectoris: Secondary | ICD-10-CM | POA: Diagnosis not present

## 2021-06-18 DIAGNOSIS — Z01818 Encounter for other preprocedural examination: Secondary | ICD-10-CM | POA: Insufficient documentation

## 2021-06-18 HISTORY — DX: Unspecified osteoarthritis, unspecified site: M19.90

## 2021-06-18 LAB — BASIC METABOLIC PANEL
Anion gap: 8 (ref 5–15)
BUN: 20 mg/dL (ref 8–23)
CO2: 28 mmol/L (ref 22–32)
Calcium: 9.6 mg/dL (ref 8.9–10.3)
Chloride: 105 mmol/L (ref 98–111)
Creatinine, Ser: 0.88 mg/dL (ref 0.44–1.00)
GFR, Estimated: >60 mL/min (ref >60)
Glucose, Bld: 97 mg/dL (ref 70–99)
Potassium: 3.8 mmol/L (ref 3.5–5.1)
Sodium: 141 mmol/L (ref 135–145)

## 2021-06-18 LAB — CBC
HCT: 34.6 % — ABNORMAL LOW (ref 36.0–46.0)
Hemoglobin: 11.6 g/dL — ABNORMAL LOW (ref 12.0–15.0)
MCH: 31.3 pg (ref 26.0–34.0)
MCHC: 33.5 g/dL (ref 30.0–36.0)
MCV: 93.3 fL (ref 80.0–100.0)
Platelets: 300 10*3/uL (ref 150–400)
RBC: 3.71 MIL/uL — ABNORMAL LOW (ref 3.87–5.11)
RDW: 13.1 % (ref 11.5–15.5)
WBC: 6.8 10*3/uL (ref 4.0–10.5)
nRBC: 0 % (ref 0.0–0.2)

## 2021-06-18 NOTE — Progress Notes (Signed)
PCP - Vena Rua, FNP ?Cardiologist - denies ? ?PPM/ICD - n/a ? ?Chest x-ray - n/a ?EKG - 06/18/21 ?Stress Test - denies ?ECHO - denies ?Cardiac Cath - denies ? ?Sleep Study - OSA+ ?CPAP - denies use ? ?Blood Thinner Instructions: n/a ?Aspirin Instructions: n/a ? ?ERAS Protcol - Clear liquids until 0530 DOS ?PRE-SURGERY Ensure or G2- none ordered. ? ?COVID TEST- n/a ? ? ?Anesthesia review: no ? ?Patient denies shortness of breath, fever, cough and chest pain at PAT appointment ? ? ?All instructions explained to the patient, with a verbal understanding of the material. Patient agrees to go over the instructions while at home for a better understanding. Patient also instructed to self quarantine after being tested for COVID-19. The opportunity to ask questions was provided. ? ? ?

## 2021-06-20 ENCOUNTER — Telehealth: Payer: Self-pay | Admitting: Genetic Counselor

## 2021-06-20 ENCOUNTER — Ambulatory Visit: Payer: Self-pay | Admitting: Genetic Counselor

## 2021-06-20 DIAGNOSIS — C50412 Malignant neoplasm of upper-outer quadrant of left female breast: Secondary | ICD-10-CM

## 2021-06-20 DIAGNOSIS — Z1379 Encounter for other screening for genetic and chromosomal anomalies: Secondary | ICD-10-CM

## 2021-06-20 NOTE — Telephone Encounter (Signed)
Revealed negative genetic testing.  Discussed that we do not know why she has breast cancer. could be sporadic/famillial, due to a change in a gene that she did not inherit, due to a different gene that we are not testing, or maybe our current technology may not be able to pick something up.  It will be important for her to keep in contact with genetics to keep up with whether additional testing may be needed.

## 2021-06-25 DIAGNOSIS — C50412 Malignant neoplasm of upper-outer quadrant of left female breast: Secondary | ICD-10-CM | POA: Diagnosis not present

## 2021-06-26 ENCOUNTER — Ambulatory Visit (HOSPITAL_COMMUNITY)
Admission: RE | Admit: 2021-06-26 | Discharge: 2021-06-26 | Disposition: A | Discharge status: Home / Self Care | Payer: Medicare Other | Attending: General Surgery | Admitting: General Surgery

## 2021-06-26 ENCOUNTER — Ambulatory Visit (HOSPITAL_BASED_OUTPATIENT_CLINIC_OR_DEPARTMENT_OTHER): Payer: Medicare Other | Admitting: Anesthesiology

## 2021-06-26 ENCOUNTER — Other Ambulatory Visit: Payer: Self-pay

## 2021-06-26 ENCOUNTER — Ambulatory Visit (HOSPITAL_COMMUNITY): Payer: Medicare Other

## 2021-06-26 ENCOUNTER — Encounter (HOSPITAL_COMMUNITY)
Admission: RE | Disposition: A | Discharge status: Home / Self Care | Payer: Self-pay | Source: Home / Self Care | Attending: General Surgery

## 2021-06-26 ENCOUNTER — Encounter (HOSPITAL_COMMUNITY): Payer: Self-pay | Admitting: General Surgery

## 2021-06-26 ENCOUNTER — Ambulatory Visit (HOSPITAL_COMMUNITY): Payer: Medicare Other | Admitting: Anesthesiology

## 2021-06-26 DIAGNOSIS — C50912 Malignant neoplasm of unspecified site of left female breast: Secondary | ICD-10-CM

## 2021-06-26 DIAGNOSIS — I10 Essential (primary) hypertension: Secondary | ICD-10-CM | POA: Diagnosis not present

## 2021-06-26 DIAGNOSIS — I889 Nonspecific lymphadenitis, unspecified: Secondary | ICD-10-CM | POA: Diagnosis not present

## 2021-06-26 DIAGNOSIS — J45909 Unspecified asthma, uncomplicated: Secondary | ICD-10-CM | POA: Diagnosis not present

## 2021-06-26 DIAGNOSIS — Z01818 Encounter for other preprocedural examination: Secondary | ICD-10-CM

## 2021-06-26 DIAGNOSIS — J9811 Atelectasis: Secondary | ICD-10-CM | POA: Diagnosis not present

## 2021-06-26 DIAGNOSIS — G4730 Sleep apnea, unspecified: Secondary | ICD-10-CM | POA: Diagnosis not present

## 2021-06-26 DIAGNOSIS — C50412 Malignant neoplasm of upper-outer quadrant of left female breast: Secondary | ICD-10-CM | POA: Diagnosis not present

## 2021-06-26 DIAGNOSIS — Z452 Encounter for adjustment and management of vascular access device: Secondary | ICD-10-CM | POA: Diagnosis not present

## 2021-06-26 DIAGNOSIS — Z171 Estrogen receptor negative status [ER-]: Secondary | ICD-10-CM | POA: Insufficient documentation

## 2021-06-26 DIAGNOSIS — G8918 Other acute postprocedural pain: Secondary | ICD-10-CM | POA: Diagnosis not present

## 2021-06-26 HISTORY — PX: BREAST LUMPECTOMY WITH RADIOACTIVE SEED AND SENTINEL LYMPH NODE BIOPSY: SHX6550

## 2021-06-26 HISTORY — PX: PORTACATH PLACEMENT: SHX2246

## 2021-06-26 SURGERY — BREAST LUMPECTOMY WITH RADIOACTIVE SEED AND SENTINEL LYMPH NODE BIOPSY
Anesthesia: Regional

## 2021-06-26 MED ORDER — LIDOCAINE 2% (20 MG/ML) 5 ML SYRINGE
INTRAMUSCULAR | Status: DC | PRN
  Administered 2021-06-26: 80 mg via INTRAVENOUS

## 2021-06-26 MED ORDER — BUPIVACAINE-EPINEPHRINE 0.25% -1:200000 IJ SOLN
INTRAMUSCULAR | Status: DC | PRN
  Administered 2021-06-26: 4 mL
  Administered 2021-06-26: 7 mL

## 2021-06-26 MED ORDER — HEPARIN 6000 UNIT IRRIGATION SOLUTION
Status: DC | PRN
  Administered 2021-06-26: 1

## 2021-06-26 MED ORDER — OXYCODONE HCL 5 MG PO TABS: 5.0000 mg | ORAL_TABLET | Freq: Four times a day (QID) | ORAL | 0 refills | Status: DC | PRN

## 2021-06-26 MED ORDER — BUPIVACAINE-EPINEPHRINE (PF) 0.25% -1:200000 IJ SOLN
INTRAMUSCULAR | Status: AC
  Filled 2021-06-26: qty 30

## 2021-06-26 MED ORDER — CELECOXIB 200 MG PO CAPS
200.0000 mg | ORAL_CAPSULE | ORAL | Status: AC
  Administered 2021-06-26: 200 mg via ORAL
  Filled 2021-06-26: qty 1

## 2021-06-26 MED ORDER — ROPIVACAINE HCL 5 MG/ML IJ SOLN
INTRAMUSCULAR | Status: DC | PRN
  Administered 2021-06-26: 20 mL via PERINEURAL

## 2021-06-26 MED ORDER — HEPARIN SOD (PORK) LOCK FLUSH 100 UNIT/ML IV SOLN
INTRAVENOUS | Status: DC | PRN
  Administered 2021-06-26: 500 [IU]

## 2021-06-26 MED ORDER — DEXAMETHASONE SODIUM PHOSPHATE 10 MG/ML IJ SOLN
INTRAMUSCULAR | Status: DC | PRN
  Administered 2021-06-26: 10 mg via INTRAVENOUS

## 2021-06-26 MED ORDER — CHLORHEXIDINE GLUCONATE CLOTH 2 % EX PADS
6.0000 | MEDICATED_PAD | Freq: Once | CUTANEOUS | Status: DC
Start: 2021-06-26 — End: 2021-06-26

## 2021-06-26 MED ORDER — PHENYLEPHRINE 80 MCG/ML (10ML) SYRINGE FOR IV PUSH (FOR BLOOD PRESSURE SUPPORT)
PREFILLED_SYRINGE | INTRAVENOUS | Status: AC
  Filled 2021-06-26: qty 10

## 2021-06-26 MED ORDER — FENTANYL CITRATE (PF) 100 MCG/2ML IJ SOLN
INTRAMUSCULAR | Status: AC
  Administered 2021-06-26: 50 ug via INTRAVENOUS
  Filled 2021-06-26: qty 2

## 2021-06-26 MED ORDER — PHENYLEPHRINE 80 MCG/ML (10ML) SYRINGE FOR IV PUSH (FOR BLOOD PRESSURE SUPPORT)
PREFILLED_SYRINGE | INTRAVENOUS | Status: DC | PRN
  Administered 2021-06-26 (×9): 80 ug via INTRAVENOUS

## 2021-06-26 MED ORDER — CHLORHEXIDINE GLUCONATE 0.12 % MT SOLN
15.0000 mL | Freq: Once | OROMUCOSAL | Status: AC
  Administered 2021-06-26: 15 mL via OROMUCOSAL
  Filled 2021-06-26: qty 15

## 2021-06-26 MED ORDER — LACTATED RINGERS IV SOLN: INTRAVENOUS | Status: DC

## 2021-06-26 MED ORDER — MIDAZOLAM HCL 2 MG/2ML IJ SOLN
INTRAMUSCULAR | Status: AC
  Administered 2021-06-26: 1 mg via INTRAVENOUS
  Filled 2021-06-26: qty 2

## 2021-06-26 MED ORDER — HEPARIN SOD (PORK) LOCK FLUSH 100 UNIT/ML IV SOLN
INTRAVENOUS | Status: AC
  Filled 2021-06-26: qty 5

## 2021-06-26 MED ORDER — DEXAMETHASONE SODIUM PHOSPHATE 10 MG/ML IJ SOLN
INTRAMUSCULAR | Status: AC
  Filled 2021-06-26: qty 1

## 2021-06-26 MED ORDER — MAGTRACE LYMPHATIC TRACER
INTRAMUSCULAR | Status: DC | PRN
Start: 2021-06-26 — End: 2021-06-26
  Administered 2021-06-26: 2 mL via INTRAMUSCULAR

## 2021-06-26 MED ORDER — HEPARIN 6000 UNIT IRRIGATION SOLUTION
Status: AC
  Filled 2021-06-26: qty 500

## 2021-06-26 MED ORDER — FENTANYL CITRATE (PF) 100 MCG/2ML IJ SOLN: 50.0000 ug | Freq: Once | INTRAMUSCULAR | Status: AC

## 2021-06-26 MED ORDER — PROPOFOL 10 MG/ML IV BOLUS
INTRAVENOUS | Status: DC | PRN
  Administered 2021-06-26: 150 mg via INTRAVENOUS

## 2021-06-26 MED ORDER — 0.9 % SODIUM CHLORIDE (POUR BTL) OPTIME
TOPICAL | Status: DC | PRN
  Administered 2021-06-26: 1000 mL

## 2021-06-26 MED ORDER — HYDROMORPHONE HCL 1 MG/ML IJ SOLN: 0.2500 mg | INTRAMUSCULAR | Status: DC | PRN

## 2021-06-26 MED ORDER — LIDOCAINE 2% (20 MG/ML) 5 ML SYRINGE
INTRAMUSCULAR | Status: AC
  Filled 2021-06-26: qty 5

## 2021-06-26 MED ORDER — CEFAZOLIN SODIUM-DEXTROSE 2-4 GM/100ML-% IV SOLN
2.0000 g | INTRAVENOUS | Status: AC
  Administered 2021-06-26: 2 g via INTRAVENOUS
  Filled 2021-06-26: qty 100

## 2021-06-26 MED ORDER — PROPOFOL 10 MG/ML IV BOLUS
INTRAVENOUS | Status: AC
  Filled 2021-06-26: qty 20

## 2021-06-26 MED ORDER — ONDANSETRON HCL 4 MG/2ML IJ SOLN
INTRAMUSCULAR | Status: DC | PRN
Start: 2021-06-26 — End: 2021-06-26
  Administered 2021-06-26: 4 mg via INTRAVENOUS

## 2021-06-26 MED ORDER — ONDANSETRON HCL 4 MG/2ML IJ SOLN: 4.0000 mg | Freq: Once | INTRAMUSCULAR | Status: DC | PRN

## 2021-06-26 MED ORDER — PHENYLEPHRINE HCL-NACL 20-0.9 MG/250ML-% IV SOLN
INTRAVENOUS | Status: DC | PRN
Start: 2021-06-26 — End: 2021-06-26
  Administered 2021-06-26: 40 ug/min via INTRAVENOUS

## 2021-06-26 MED ORDER — OXYCODONE HCL 5 MG PO TABS
ORAL_TABLET | ORAL | Status: AC
  Filled 2021-06-26: qty 1

## 2021-06-26 MED ORDER — AMISULPRIDE (ANTIEMETIC) 5 MG/2ML IV SOLN: 10.0000 mg | Freq: Once | INTRAVENOUS | Status: DC | PRN

## 2021-06-26 MED ORDER — ACETAMINOPHEN 500 MG PO TABS
1000.0000 mg | ORAL_TABLET | Freq: Once | ORAL | Status: DC
  Filled 2021-06-26: qty 2

## 2021-06-26 MED ORDER — BUPIVACAINE LIPOSOME 1.3 % IJ SUSP
INTRAMUSCULAR | Status: DC | PRN
  Administered 2021-06-26: 10 mL via PERINEURAL

## 2021-06-26 MED ORDER — ONDANSETRON HCL 4 MG/2ML IJ SOLN
INTRAMUSCULAR | Status: AC
  Filled 2021-06-26: qty 2

## 2021-06-26 MED ORDER — OXYCODONE HCL 5 MG/5ML PO SOLN: 5.0000 mg | Freq: Once | ORAL | Status: AC | PRN

## 2021-06-26 MED ORDER — GLYCOPYRROLATE PF 0.2 MG/ML IJ SOSY
PREFILLED_SYRINGE | INTRAMUSCULAR | Status: DC | PRN
Start: 2021-06-26 — End: 2021-06-26
  Administered 2021-06-26: .2 mg via INTRAVENOUS

## 2021-06-26 MED ORDER — CHLORHEXIDINE GLUCONATE CLOTH 2 % EX PADS: 6.0000 | MEDICATED_PAD | Freq: Once | CUTANEOUS | Status: DC

## 2021-06-26 MED ORDER — MIDAZOLAM HCL 2 MG/2ML IJ SOLN: 1.0000 mg | Freq: Once | INTRAMUSCULAR | Status: AC

## 2021-06-26 MED ORDER — ORAL CARE MOUTH RINSE: 15.0000 mL | Freq: Once | OROMUCOSAL | Status: AC

## 2021-06-26 MED ORDER — ACETAMINOPHEN 500 MG PO TABS
1000.0000 mg | ORAL_TABLET | ORAL | Status: AC
Start: 2021-06-26 — End: 2021-06-26
  Administered 2021-06-26: 1000 mg via ORAL
  Filled 2021-06-26: qty 2

## 2021-06-26 MED ORDER — OXYCODONE HCL 5 MG PO TABS
5.0000 mg | ORAL_TABLET | Freq: Once | ORAL | Status: AC | PRN
  Administered 2021-06-26: 5 mg via ORAL

## 2021-06-26 MED ORDER — GABAPENTIN 300 MG PO CAPS
300.0000 mg | ORAL_CAPSULE | ORAL | Status: AC
  Administered 2021-06-26: 300 mg via ORAL
  Filled 2021-06-26: qty 1

## 2021-06-26 MED ORDER — GLYCOPYRROLATE PF 0.2 MG/ML IJ SOSY
PREFILLED_SYRINGE | INTRAMUSCULAR | Status: AC
  Filled 2021-06-26: qty 1

## 2021-06-26 SURGICAL SUPPLY — 51 items
APPLIER CLIP 9.375 MED OPEN (MISCELLANEOUS) ×3
BAG COUNTER SPONGE SURGICOUNT (BAG) ×3 IMPLANT
BAG DECANTER FOR FLEXI CONT (MISCELLANEOUS) ×3 IMPLANT
CANISTER SUCT 3000ML PPV (MISCELLANEOUS) ×3 IMPLANT
CHLORAPREP W/TINT 10.5 ML (MISCELLANEOUS) ×3 IMPLANT
CHLORAPREP W/TINT 26 (MISCELLANEOUS) ×3 IMPLANT
CLIP APPLIE 9.375 MED OPEN (MISCELLANEOUS) ×2 IMPLANT
CNTNR URN SCR LID CUP LEK RST (MISCELLANEOUS) ×2 IMPLANT
CONT SPEC 4OZ STRL OR WHT (MISCELLANEOUS) ×3
COVER PROBE W GEL 5X96 (DRAPES) ×3 IMPLANT
COVER SURGICAL LIGHT HANDLE (MISCELLANEOUS) ×3 IMPLANT
COVER TRANSDUCER ULTRASND GEL (DISPOSABLE) ×2 IMPLANT
DECANTER SPIKE VIAL GLASS SM (MISCELLANEOUS) ×3 IMPLANT
DERMABOND ADVANCED (GAUZE/BANDAGES/DRESSINGS) ×3
DERMABOND ADVANCED .7 DNX12 (GAUZE/BANDAGES/DRESSINGS) ×2 IMPLANT
DEVICE DUBIN SPECIMEN MAMMOGRA (MISCELLANEOUS) ×3 IMPLANT
DRAPE C-ARM 42X120 X-RAY (DRAPES) ×3 IMPLANT
DRAPE CHEST BREAST 15X10 FENES (DRAPES) ×3 IMPLANT
ELECT CAUTERY BLADE 6.4 (BLADE) ×3 IMPLANT
ELECT COATED BLADE 2.86 ST (ELECTRODE) ×3 IMPLANT
ELECT REM PT RETURN 9FT ADLT (ELECTROSURGICAL) ×3
ELECTRODE REM PT RTRN 9FT ADLT (ELECTROSURGICAL) ×2 IMPLANT
GLOVE BIO SURGEON STRL SZ7.5 (GLOVE) ×6 IMPLANT
GOWN STRL REUS W/ TWL LRG LVL3 (GOWN DISPOSABLE) ×4 IMPLANT
GOWN STRL REUS W/TWL LRG LVL3 (GOWN DISPOSABLE) ×6
KIT BASIN OR (CUSTOM PROCEDURE TRAY) ×3 IMPLANT
KIT MARKER MARGIN INK (KITS) ×3 IMPLANT
KIT PORT POWER 8FR ISP CVUE (Port) ×1 IMPLANT
KIT TURNOVER KIT B (KITS) ×3 IMPLANT
NDL FILTER BLUNT 18X1 1/2 (NEEDLE) IMPLANT
NDL HYPO 25GX1X1/2 BEV (NEEDLE) ×2 IMPLANT
NEEDLE FILTER BLUNT 18X 1/2SAF (NEEDLE) ×1
NEEDLE FILTER BLUNT 18X1 1/2 (NEEDLE) ×2 IMPLANT
NEEDLE HYPO 25GX1X1/2 BEV (NEEDLE) ×3 IMPLANT
NS IRRIG 1000ML POUR BTL (IV SOLUTION) ×3 IMPLANT
PACK GENERAL/GYN (CUSTOM PROCEDURE TRAY) ×3 IMPLANT
PAD ARMBOARD 7.5X6 YLW CONV (MISCELLANEOUS) ×3 IMPLANT
PENCIL BUTTON HOLSTER BLD 10FT (ELECTRODE) ×3 IMPLANT
POSITIONER HEAD DONUT 9IN (MISCELLANEOUS) ×3 IMPLANT
SHEATH COOK PEEL AWAY SET 9F (SHEATH) ×1 IMPLANT
SUT MNCRL AB 4-0 PS2 18 (SUTURE) ×6 IMPLANT
SUT PROLENE 2 0 SH 30 (SUTURE) ×3 IMPLANT
SUT VIC AB 3-0 SH 18 (SUTURE) ×3 IMPLANT
SUT VIC AB 3-0 SH 27 (SUTURE) ×3
SUT VIC AB 3-0 SH 27XBRD (SUTURE) ×2 IMPLANT
SYR 10ML LL (SYRINGE) ×1 IMPLANT
SYR 5ML LUER SLIP (SYRINGE) ×3 IMPLANT
SYR CONTROL 10ML LL (SYRINGE) ×3 IMPLANT
TOWEL GREEN STERILE (TOWEL DISPOSABLE) ×3 IMPLANT
TOWEL GREEN STERILE FF (TOWEL DISPOSABLE) ×3 IMPLANT
TRAY LAPAROSCOPIC MC (CUSTOM PROCEDURE TRAY) ×3 IMPLANT

## 2021-06-26 NOTE — Anesthesia Procedure Notes (Signed)
Anesthesia Regional Block: Pectoralis block   Pre-Anesthetic Checklist: , timeout performed,  Correct Patient, Correct Site, Correct Laterality,  Correct Procedure, Correct Position, site marked,  Risks and benefits discussed,  Surgical consent,  Pre-op evaluation,  At surgeon's request and post-op pain management  Laterality: Left  Prep: Maximum Sterile Barrier Precautions used, chloraprep       Needles:  Injection technique: Single-shot  Needle Type: Echogenic Stimulator Needle     Needle Length: 9cm  Needle Gauge: 22     Additional Needles:   Procedures:,,,, ultrasound used (permanent image in chart),,    Narrative:  Start time: 06/26/2021 7:20 AM End time: 06/26/2021 7:25 AM Injection made incrementally with aspirations every 5 mL.  Performed by: Personally  Anesthesiologist: Pervis Hocking, DO  Additional Notes: Monitors applied. No increased pain on injection. No increased resistance to injection. Injection made in 5cc increments. Good needle visualization. Patient tolerated procedure well.

## 2021-06-26 NOTE — Progress Notes (Signed)
HPI:   Sheri Fleming was previously seen in the [facility] clinic due to a personal history of triple negative breast cancer and concerns regarding a hereditary predisposition to cancer. Please refer to our prior cancer genetics clinic note for more information regarding our discussion, assessment and recommendations, at the time. Sheri Fleming recent genetic test results were disclosed to Sheri Fleming, as were recommendations warranted by these results. These results and recommendations are discussed in more detail below.  CANCER HISTORY:  Oncology History  Malignant neoplasm of upper-outer quadrant of left breast in female, estrogen receptor negative ([facility])  05/24/2021 Initial Diagnosis   Screening mammogram detected left UOQ mass 1.5 cm at 2 o'clock position, axilla negative, ultrasound biopsy: Grade 3 IDC ER 0%, PR 0%, HER2 equivocal, FISH pending, Ki-67 40%   05/29/2021 Cancer Staging   Staging form: Breast, AJCC 8th Edition - Clinical: Stage IB (cT1c, cN0, cM0, G3, ER-, PR-, HER2: Equivocal) - Signed by Nicholas Lose, MD on 05/29/2021 Stage prefix: Initial diagnosis Histologic grading system: 3 grade system    06/10/2021 Genetic Testing   Negative hereditary cancer genetic testing: no pathogenic variants detected in [facility] CustomNext-Cancer +RNAinsight Panel.  Report date is Jun 10, 2021.   The CustomNext-Cancer+RNAinsight panel offered by [facility] includes sequencing and rearrangement analysis for the following 47 genes:  APC, ATM, AXIN2, BARD1, BMPR1A, BRCA1, BRCA2, BRIP1, CDH1, CDK4, CDKN2A, CHEK2, DICER1, EPCAM, GREM1, HOXB13, MEN1, MLH1, MSH2, MSH3, MSH6, MUTYH, NBN, NF1, NF2, NTHL1, PALB2, PMS2, POLD1, POLE, PTEN, RAD51C, RAD51D, RECQL, RET, SDHA, SDHAF2, SDHB, SDHC, SDHD, SMAD4, SMARCA4, STK11, TP53, TSC1, TSC2, and VHL.  RNA data is routinely analyzed for use in variant interpretation for all genes.    FAMILY HISTORY:  We obtained a detailed, 4-generation family history.   Significant diagnoses are listed below:      Family History  Problem Relation Age of Onset   Lung cancer Father          d. 39s   Vaginal cancer Sister 72   Cancer Brother          unknown type; dx 67s; mets        Sheri Fleming is unaware of previous family history of genetic testing for hereditary cancer risks. There is no reported Ashkenazi Jewish ancestry. There is no known consanguinity.  GENETIC TEST RESULTS:  The [facility] CustomNext-Cancer +RNAinsight Panel found no pathogenic mutations.   The CustomNext-Cancer+RNAinsight panel offered by [facility] includes sequencing and rearrangement analysis for the following 47 genes:  APC, ATM, AXIN2, BARD1, BMPR1A, BRCA1, BRCA2, BRIP1, CDH1, CDK4, CDKN2A, CHEK2, DICER1, EPCAM, GREM1, HOXB13, MEN1, MLH1, MSH2, MSH3, MSH6, MUTYH, NBN, NF1, NF2, NTHL1, PALB2, PMS2, POLD1, POLE, PTEN, RAD51C, RAD51D, RECQL, RET, SDHA, SDHAF2, SDHB, SDHC, SDHD, SMAD4, SMARCA4, STK11, TP53, TSC1, TSC2, and VHL.  RNA data is routinely analyzed for use in variant interpretation for all genes.  The test report has been scanned into EPIC and is located under the Molecular Pathology section of the Results Review tab.  A portion of the result report is included below for reference. Genetic testing reported out on Jun 10, 2021.      Even though a pathogenic variant was not identified, possible explanations for the cancer in the family may include: There may be no hereditary risk for cancer in the family. The cancers in Sheri Fleming may be sporadic/familial or due to other genetic and environmental factors. There may be a gene mutation in one of these genes that current testing  methods cannot detect but that chance is small. There could be another gene that has not yet been discovered, or that we have not yet tested, that is responsible for the cancer diagnoses in the family.   Therefore, it is important to remain in touch with cancer genetics in the future so that we can  continue to offer Sheri Fleming the most up to date genetic testing.   ADDITIONAL GENETIC TESTING:  We discussed with Sheri Fleming that Sheri Fleming genetic testing was fairly extensive.  If there are additional relevant genes identified to increase cancer risk that can be analyzed in the future, we would be happy to discuss and coordinate this testing at that time.     CANCER SCREENING RECOMMENDATIONS:  Sheri Fleming test result is considered negative (normal).  This means that we have not identified a hereditary cause for Sheri Fleming personal history of triple negative breast cancer at this time.   An individual's cancer risk and medical management are not determined by genetic test results alone. Overall cancer risk assessment incorporates additional factors, including personal medical history, family history, and any available genetic information that may result in a personalized plan for cancer prevention and surveillance. Therefore, it is recommended she continue to follow the cancer management and screening guidelines provided by Sheri Fleming oncology and primary healthcare provider.  RECOMMENDATIONS FOR FAMILY MEMBERS:   Since she did not inherit a identifiable mutation in a cancer predisposition gene included on this panel, Sheri Fleming children could not have inherited a known mutation from Sheri Fleming in one of these genes. Individuals in this family might be at some increased risk of developing cancer, over the general population risk, due to the family history of cancer.  Individuals in the family should notify their providers of the family history of cancer. We recommend women in this family have a yearly mammogram beginning at age 71, or 36 years younger than the earliest onset of cancer, an annual clinical breast exam, and perform monthly breast self-exams.    FOLLOW-UP:  Lastly, we discussed with Sheri Fleming that cancer genetics is a rapidly advancing field and it is possible that new genetic tests will be appropriate for Sheri Fleming and/or Sheri Fleming  family members in the future. We encouraged Sheri Fleming to remain in contact with cancer genetics on an annual basis so we can update Sheri Fleming personal and family histories and let Sheri Fleming know of advances in cancer genetics that may benefit this family.   Our contact number was provided. Sheri Fleming questions were answered to Sheri Fleming satisfaction, and she knows she is welcome to call us at anytime with additional questions or concerns.   Sheri Fleming, [place], [facility] Genetic Counselor [email or web address] (P) [phone]

## 2021-06-26 NOTE — Anesthesia Preprocedure Evaluation (Addendum)
Anesthesia Evaluation  Patient identified by MRN, date of birth, ID band Patient awake    Reviewed: Allergy & Precautions, NPO status , Patient's Chart, lab work & pertinent test results  Airway Mallampati: II  TM Distance: >3 FB Neck ROM: Full    Dental  (+) Missing, Dental Advisory Given, Poor Dentition, Chipped,    Pulmonary asthma , sleep apnea ,    Pulmonary exam normal breath sounds clear to auscultation       Cardiovascular hypertension, Pt. on medications Normal cardiovascular exam Rhythm:Regular Rate:Normal     Neuro/Psych negative neurological ROS  negative psych ROS   GI/Hepatic negative GI ROS, Neg liver ROS,   Endo/Other  negative endocrine ROS  Renal/GU negative Renal ROS  negative genitourinary   Musculoskeletal  (+) Arthritis , Osteoarthritis,    Abdominal   Peds negative pediatric ROS (+)  Hematology negative hematology ROS (+) Hb 11.6, plt 300   Anesthesia Other Findings Breast ca   Reproductive/Obstetrics negative OB ROS                           Anesthesia Physical Anesthesia Plan  ASA: 2  Anesthesia Plan: General and Regional   Post-op Pain Management: Regional block* and Tylenol PO (pre-op)*   Induction: Intravenous  PONV Risk Score and Plan: 3 and Ondansetron, Dexamethasone and Treatment may vary due to age or medical condition  Airway Management Planned: LMA  Additional Equipment: None  Intra-op Plan:   Post-operative Plan: Extubation in OR  Informed Consent: I have reviewed the patients History and Physical, chart, labs and discussed the procedure including the risks, benefits and alternatives for the proposed anesthesia with the patient or authorized representative who has indicated his/her understanding and acceptance.     Dental advisory given  Plan Discussed with: CRNA  Anesthesia Plan Comments:         Anesthesia Quick Evaluation

## 2021-06-26 NOTE — H&P (Signed)
REFERRING PHYSICIAN: Rulon Eisenmenger, MD  PROVIDER: Landry Corporal, MD  MRN: [id number] DOB: 1950/02/26 Subjective   Chief Complaint: Breast Cancer   History of Present Illness: Sheri Fleming is a 71 y.o. female who is seen today as an office consultation for evaluation of Breast Cancer .   We are asked to see the patient in consultation by Dr. Lindi Adie to evaluate her for a new left breast cancer. The patient is a 71 year old black female who recently went for a routine screening mammogram. At that time she was found to have a 1.5 cm mass in the upper outer quadrant of the left breast. The lymph nodes looked normal. The mass was biopsied and came back as a grade 3 invasive ductal cancer that was triple negative with a Ki-67 of 40%. She denies any family history of breast cancer. She does not smoke. She is not on any hormone replacement.  Review of Systems: A complete review of systems was obtained from the patient. I have reviewed this information and discussed as appropriate with the patient. See HPI as well for other ROS.  ROS   Medical History: Past Medical History:  Diagnosis Date   Arthritis   Asthma, unspecified asthma severity, unspecified whether complicated, unspecified whether persistent   Hyperlipidemia   Hypertension   Sleep apnea   Patient Active Problem List  Diagnosis   Malignant neoplasm of upper-outer quadrant of left breast in female, estrogen receptor negative (CMS-HCC)   Past Surgical History:  Procedure Laterality Date   HYSTERECTOMY   JOINT REPLACEMENT    No Known Allergies  Current Outpatient Medications on File Prior to Visit  Medication Sig Dispense Refill   alendronate (FOSAMAX) 70 MG tablet   lisinopriL-hydroCHLOROthiazide (ZESTORETIC) 20-25 mg tablet   montelukast (SINGULAIR) 10 mg tablet   simvastatin (ZOCOR) 20 MG tablet Take 20 mg by mouth every evening   No current facility-administered medications on file prior to visit.   Family  History  Problem Relation Age of Onset   Stroke Mother   High blood pressure (Hypertension) Mother    Social History   Tobacco Use  Smoking Status Never  Smokeless Tobacco Never    Social History   Socioeconomic History   Marital status: Married  Tobacco Use   Smoking status: Never   Smokeless tobacco: Never  Vaping Use   Vaping Use: Never used  Substance and Sexual Activity   Alcohol use: Not Currently   Drug use: Never   Objective:  There were no vitals filed for this visit.  There is no height or weight on file to calculate BMI.  Physical Exam Vitals reviewed.  Constitutional:  General: She is not in acute distress. Appearance: Normal appearance.  HENT:  Head: Normocephalic and atraumatic.  Right Ear: External ear normal.  Left Ear: External ear normal.  Nose: Nose normal.  Mouth/Throat:  Mouth: Mucous membranes are moist.  Pharynx: Oropharynx is clear.  Eyes:  General: No scleral icterus. Extraocular Movements: Extraocular movements intact.  Conjunctiva/sclera: Conjunctivae normal.  Pupils: Pupils are equal, round, and reactive to light.  Cardiovascular:  Rate and Rhythm: Normal rate and regular rhythm.  Pulses: Normal pulses.  Heart sounds: Normal heart sounds.  Pulmonary:  Effort: Pulmonary effort is normal. No respiratory distress.  Breath sounds: Normal breath sounds.  Abdominal:  General: Bowel sounds are normal.  Palpations: Abdomen is soft.  Tenderness: There is no abdominal tenderness.  Musculoskeletal:  General: No swelling, tenderness or deformity. Normal range of motion.  Cervical back: Normal range of motion and neck supple.  Skin: General: Skin is warm and dry.  Coloration: Skin is not jaundiced.  Neurological:  General: No focal deficit present.  Mental Status: She is alert and oriented to person, place, and time.  Psychiatric:  Mood and Affect: Mood normal.  Behavior: Behavior normal.     Breast: There is a vaguely palpable  1-1/2 cm mass in the upper outer quadrant of the left breast that is mobile. There are no overlying skin changes. There is no palpable mass in the right breast. There is no palpable axillary, supraclavicular, or cervical lymphadenopathy.  Labs, Imaging and Diagnostic Testing:  Assessment and Plan:   Diagnoses and all orders for this visit:  Malignant neoplasm of upper-outer quadrant of left breast in female, estrogen receptor negative (CMS-HCC) - [facility] Case Posting Request; Future    The patient appears to have a 1.5 cm cancer in the upper outer quadrant of the left breast with clinically negative nodes. I have discussed with her in detail the different options for treatment and at this point she favors breast conservation which I feel is very reasonable. She is also a good candidate for sentinel node biopsy. I have discussed with her in detail the risks and benefits of the operation as well as some of the technical aspects including use of a radioactive seed for localization and she understands and wishes to proceed. She will also need chemotherapy given that she has triple negative so I have also discussed with her the need for a port. This can be placed at the same time. I have discussed with her also the risks and benefits of the port placement and she understands and agrees to this as well. She will meet today with medical and radiation oncology to discuss adjuvant therapy. She will also meet with physical therapy for preoperative lymphedema testing.

## 2021-06-26 NOTE — Anesthesia Procedure Notes (Addendum)
Procedure Name: LMA Insertion Date/Time: 06/26/2021 8:51 AM Performed by: Josephine Igo, MD Pre-anesthesia Checklist: Patient identified, Emergency Drugs available, Suction available and Patient being monitored Patient Re-evaluated:Patient Re-evaluated prior to induction Oxygen Delivery Method: Circle system utilized Preoxygenation: Pre-oxygenation with 100% oxygen Induction Type: IV induction LMA: LMA inserted LMA Size: 4.0 Placement Confirmation: positive ETCO2 Dental Injury: Teeth and Oropharynx as per pre-operative assessment

## 2021-06-26 NOTE — Interval H&P Note (Signed)
History and Physical Interval Note:  06/26/2021 8:05 AM  Sheri Fleming  has presented today for surgery, with the diagnosis of LEFT BREAST CANCER.  The various methods of treatment have been discussed with the patient and family. After consideration of risks, benefits and other options for treatment, the patient has consented to  Procedure(s) with comments: LEFT BREAST LUMPECTOMY WITH RADIOACTIVE SEED AND SENTINEL LYMPH NODE BIOPSY (Left) - GEN & PEC BLOCK PORT PLACEMENT (N/A) as a surgical intervention.  The patient's history has been reviewed, patient examined, no change in status, stable for surgery.  I have reviewed the patient's chart and labs.  Questions were answered to the patient's satisfaction.     Autumn Messing III

## 2021-06-26 NOTE — Transfer of Care (Signed)
Immediate Anesthesia Transfer of Care Note  Patient: MONSERRAT VIDAURRI  Procedure(s) Performed: LEFT BREAST LUMPECTOMY WITH RADIOACTIVE SEED AND SENTINEL LYMPH NODE BIOPSY (Left) PORT PLACEMENT  Patient Location: PACU  Anesthesia Type:GA combined with regional for post-op pain  Level of Consciousness: awake, alert  and oriented  Airway & Oxygen Therapy: Patient Spontanous Breathing  Post-op Assessment: Report given to RN, Post -op Vital signs reviewed and stable, Patient moving all extremities X 4 and Patient able to stick tongue midline  Post vital signs: Reviewed  Last Vitals:  Vitals Value Taken Time  BP 141/75 06/26/21 1038  Temp 97.8   Pulse 78 06/26/21 1039  Resp 18 06/26/21 1039  SpO2 97 % 06/26/21 1039  Vitals shown include unvalidated device data.  Last Pain:  Vitals:   06/26/21 0734  TempSrc:   PainSc: 0-No pain      Patients Stated Pain Goal: 0 (03/00/92 3300)  Complications: No notable events documented.

## 2021-06-26 NOTE — Op Note (Signed)
06/26/2021  10:30 AM  PATIENT:  Sheri Fleming  71 y.o. female  PRE-OPERATIVE DIAGNOSIS:  LEFT BREAST CANCER  POST-OPERATIVE DIAGNOSIS:  LEFT BREAST CANCER  PROCEDURE:  Procedure(s) with comments: LEFT BREAST LUMPECTOMY WITH RADIOACTIVE SEED LOCALIZATION AND DEEP LEFT AXILLARY SENTINEL LYMPH NODE BIOPSY (Left) - GEN & PEC BLOCK PORT PLACEMENT (N/A)  SURGEON:  Surgeon(s) and Role:    * Jovita Kussmaul, MD - Primary  PHYSICIAN ASSISTANT:   ASSISTANTS: none   ANESTHESIA:   local and general  EBL:  5 mL   BLOOD ADMINISTERED:none  DRAINS: none   LOCAL MEDICATIONS USED:  MARCAINE     SPECIMEN:  Source of Specimen:  left breast tissue and sentinel nodes x 2  DISPOSITION OF SPECIMEN:  PATHOLOGY  COUNTS:  YES  TOURNIQUET:  * No tourniquets in log *  DICTATION: .Dragon Dictation  After informed consent was obtained the patient was brought to the operating room and placed in the supine position on the operating table.  After adequate induction of general anesthesia a roll was placed between the patient's shoulder blades to extend the shoulder slightly.  The bilateral chest, breast, and axillary areas were then prepped with ChloraPrep, allowed to dry, and draped in usual sterile manner.  An appropriate timeout was performed.  The patient was placed in Trendelenburg position.  The area lateral to the bend of the clavicle and the right chest wall was infiltrated with quarter percent Marcaine.  A large bore needle from the Port-A-Cath kit was used to slide beneath the bend of the clavicle heading towards the sternal notch and in doing so I was able to access the right subclavian vein without difficulty.  A wire was fed through the needle using the Seldinger technique without difficulty.  The wire was confirmed in the central venous system using real-time fluoroscopy.  Next a small incision was made at the wire entry site with a 15 blade knife.  The incision was carried through the skin and  subcutaneous tissue sharply with the electrocautery.  A subcutaneous pocket was then created inferior to the incision by blunt finger dissection.  The tubing was then placed on the reservoir.  The reservoir was placed in the pocket and the length of the tubing was estimated using real-time fluoroscopy.  The tubing was then cut to the appropriate length.  Next a sheath and dilator were fed over the wire using the Seldinger technique without difficulty.  The dilator and wire were then removed from the patient.  The tubing was fed through the sheath as far as it would go and then held in place while the sheath was gently cracked and separated.  Another real-time fluoroscopy image showed the tip of the catheter to be in the distal superior vena cava.  The tubing was then permanently anchored to the reservoir.  The reservoir was anchored in the pocket with two 2-0 Prolene stitches.  The port was then aspirated and it aspirated blood easily.  The port was then flushed initially with a dilute heparin solution and then with a more concentrated heparin solution.  The subcutaneous tissue was closed over the port with interrupted 3-0 Vicryl stitches.  The skin was closed with a running 4-0 Monocryl subcuticular stitch.  Dermabond dressings were applied.  Attention was then turned to the left breast.  Previously an I-125 seed was placed in the outer aspect of the left breast to mark an area of invasive breast cancer.  Also at the beginning of  the case 2 cc of iron oxide were injected into the subareolar plexus of the left breast.  The neoprobe was set to I-125 in the area of radioactivity was readily identified.  An elliptical incision was made in the skin overlying the area of radioactivity with a 15 blade knife.  The incision was carried through the skin and subcutaneous tissue sharply with the electrocautery.  Dissection was then carried widely around the radioactive seed while checking the area of radioactivity frequently.   Once this dissection was accomplished then the specimen was removed from the patient.  It was oriented with the appropriate paint colors.  A specimen radiograph was obtained that showed the clip and seed to be near the center of the specimen.  Specimen was then sent to pathology for further evaluation.  Hemostasis was achieved using the Bovie electrocautery and the cavity was marked with clips.  Dissection was then carried through the lumpectomy cavity into the deep left axillary space under the direction of the mag trace.  I was able to identify a lymph node with increased signal.  This was excised sharply with the electrocautery and the surrounding small vessels and lymphatics were controlled with clips.  A second palpable node was also identified in the same area and it was also excised sharply with the electrocautery and the surrounding small vessels and lymphatics were controlled with clips.  These were sent as sentinel nodes numbers 1 and 2.  No other hot or palpable nodes were identified in the left axilla.  Hemostasis was achieved using the Bovie electrocautery.  The deep layer of the incision was then closed with layers of interrupted 3-0 Vicryl stitches.  The skin was then closed with a running 4-0 Monocryl subcuticular stitch.  Dermabond dressings were applied.  The patient tolerated the procedure well.  At the end of the case all needle sponge and instrument counts were correct.  The patient was then awakened and taken to recovery in stable condition.  PLAN OF CARE: Discharge to home after PACU  PATIENT DISPOSITION:  PACU - hemodynamically stable.   Delay start of Pharmacological VTE agent (>24hrs) due to surgical blood loss or risk of bleeding: not applicable

## 2021-06-26 NOTE — Anesthesia Postprocedure Evaluation (Signed)
Anesthesia Post Note  Patient: Sheri Fleming  Procedure(s) Performed: LEFT BREAST LUMPECTOMY WITH RADIOACTIVE SEED AND SENTINEL LYMPH NODE BIOPSY (Left) PORT PLACEMENT     Patient location during evaluation: PACU Anesthesia Type: Regional and General Level of consciousness: awake and alert, oriented and patient cooperative Pain management: pain level controlled Vital Signs Assessment: post-procedure vital signs reviewed and stable Respiratory status: spontaneous breathing, nonlabored ventilation and respiratory function stable Cardiovascular status: blood pressure returned to baseline and stable Postop Assessment: no apparent nausea or vomiting Anesthetic complications: no   No notable events documented.  Last Vitals:  Vitals:   06/26/21 0805 06/26/21 1040  BP: 120/74 (!) 141/75  Pulse: 70 83  Resp: 10 13  Temp:  36.6 C  SpO2: 100% 96%    Last Pain:  Vitals:   06/26/21 1040  TempSrc:   PainSc: [place]

## 2021-06-27 ENCOUNTER — Encounter (HOSPITAL_COMMUNITY): Payer: Self-pay | Admitting: General Surgery

## 2021-06-28 LAB — SURGICAL PATHOLOGY

## 2021-07-03 ENCOUNTER — Encounter: Payer: Self-pay | Admitting: *Deleted

## 2021-07-04 ENCOUNTER — Other Ambulatory Visit: Payer: Self-pay | Admitting: Hematology and Oncology

## 2021-07-04 DIAGNOSIS — Z171 Estrogen receptor negative status [ER-]: Secondary | ICD-10-CM

## 2021-07-04 MED ORDER — PROCHLORPERAZINE MALEATE 10 MG PO TABS: 10.0000 mg | ORAL_TABLET | Freq: Four times a day (QID) | ORAL | 1 refills | Status: DC | PRN

## 2021-07-04 MED ORDER — ONDANSETRON HCL 8 MG PO TABS: 8.0000 mg | ORAL_TABLET | Freq: Two times a day (BID) | ORAL | 1 refills | Status: DC | PRN

## 2021-07-04 MED ORDER — LIDOCAINE-PRILOCAINE 2.5-2.5 % EX CREA: TOPICAL_CREAM | CUTANEOUS | 3 refills | Status: DC

## 2021-07-04 MED ORDER — DEXAMETHASONE 4 MG PO TABS: 4.0000 mg | ORAL_TABLET | Freq: Every day | ORAL | 1 refills | Status: DC

## 2021-07-04 NOTE — Progress Notes (Signed)
START ON PATHWAY REGIMEN - Breast ? ? ?  A cycle is every 21 days: ?    Docetaxel  ?    Cyclophosphamide  ? ?**Always confirm dose/schedule in your pharmacy ordering system** ? ?Patient Characteristics: ?Postoperative without Neoadjuvant Therapy (Pathologic Staging), Invasive Disease, Adjuvant Therapy, HER2 Negative/Unknown/Equivocal, ER Negative/Unknown, Node Negative, pT1a-c, N1mi or pT1c or Higher, pN0 ?Therapeutic Status: Postoperative without Neoadjuvant Therapy (Pathologic Staging) ?AJCC Grade: G3 ?AJCC N Category: pN0 ?AJCC M Category: cM0 ?ER Status: Negative (-) ?AJCC 8 Stage Grouping: IB ?HER2 Status: Negative (-) ?Oncotype Dx Recurrence Score: Not Appropriate ?AJCC T Category: pT1c ?PR Status: Negative (-) ?Adjuvant Therapy Status: No Adjuvant Therapy Received Yet or Changing Initial Adjuvant Regimen due to Tolerance ?Intent of Therapy: ?Curative Intent, Discussed with Patient ?

## 2021-07-09 NOTE — Progress Notes (Signed)
Patient Care Team: Renee Rival, FNP as PCP - General (Nurse Practitioner) Mauro Kaufmann, RN as Oncology Nurse Navigator Rockwell Germany, RN as Oncology Nurse Navigator Jovita Kussmaul, MD as Consulting Physician (General Surgery) Nicholas Lose, MD as Consulting Physician (Hematology and Oncology) Kyung Rudd, MD as Consulting Physician (Radiation Oncology)  DIAGNOSIS:  Encounter Diagnosis  Name Primary?   Malignant neoplasm of upper-outer quadrant of left breast in female, estrogen receptor negative ([place])     SUMMARY OF ONCOLOGIC HISTORY: Oncology History  Malignant neoplasm of upper-outer quadrant of left breast in female, estrogen receptor negative ([place])  05/24/2021 Initial Diagnosis   Screening mammogram detected left UOQ mass 1.5 cm at 2 o'clock position, axilla negative, ultrasound biopsy: Grade 3 IDC ER 0%, PR 0%, HER2 equivocal, FISH pending, Ki-67 40%   05/29/2021 Cancer Staging   Staging form: Breast, AJCC 8th Edition - Clinical: Stage IB (cT1c, cN0, cM0, G3, ER-, PR-, HER2: Equivocal) - Signed by Nicholas Lose, MD on 05/29/2021 Stage prefix: Initial diagnosis Histologic grading system: 3 grade system   06/10/2021 Genetic Testing   Negative hereditary cancer genetic testing: no pathogenic variants detected in [facility] CustomNext-Cancer +RNAinsight Panel.  Report date is Jun 10, 2021.   The CustomNext-Cancer+RNAinsight panel offered by [facility] includes sequencing and rearrangement analysis for the following 47 genes:  APC, ATM, AXIN2, BARD1, BMPR1A, BRCA1, BRCA2, BRIP1, CDH1, CDK4, CDKN2A, CHEK2, DICER1, EPCAM, GREM1, HOXB13, MEN1, MLH1, MSH2, MSH3, MSH6, MUTYH, NBN, NF1, NF2, NTHL1, PALB2, PMS2, POLD1, POLE, PTEN, RAD51C, RAD51D, RECQL, RET, SDHA, SDHAF2, SDHB, SDHC, SDHD, SMAD4, SMARCA4, STK11, TP53, TSC1, TSC2, and VHL.  RNA data is routinely analyzed for use in variant interpretation for all genes.   06/26/2021 Surgery   Left Lumpectomy: Grade 3 IDC 1.5 cm,  margins Neg, 0/2 LN Neg, ER 0%, PR 0%, Her 2 Neg, Ki 67: 40%   07/29/2021 -  Chemotherapy   Patient is on Treatment Plan : BREAST TC q21d       CHIEF COMPLIANT: Follow up to discuss treatment.  INTERVAL HISTORY: Sheri Fleming is a 71 y.o. female is here because of recent diagnosis of left breast cancer. She presents to the clinic today for a follow-up to finalize her adjuvant treatment plan. States that she has no concerns today. States that she is doing really good.   ALLERGIES:  has No Known Allergies.  MEDICATIONS:  Current Outpatient Medications  Medication Sig Dispense Refill   acetaminophen (TYLENOL) 500 MG tablet Take 500 mg by mouth every 6 (six) hours as needed for mild pain.     alendronate (FOSAMAX) 70 MG tablet Take 70 mg by mouth once a week. Thursday  3   dexamethasone (DECADRON) 4 MG tablet Take 1 tablet (4 mg total) by mouth daily. Take 1 tablet day before chemo and 1 tablet day after chemo with food 30 tablet 1   lidocaine-prilocaine (EMLA) cream Apply to affected area once 30 g 3   lisinopril-hydrochlorothiazide (PRINZIDE,ZESTORETIC) 20-25 MG per tablet Take 1 tablet by mouth daily. 30 tablet 0   montelukast (SINGULAIR) 10 MG tablet Take 1 tablet (10 mg total) by mouth at bedtime. 30 tablet 0   ondansetron (ZOFRAN) 8 MG tablet Take 1 tablet (8 mg total) by mouth 2 (two) times daily as needed for refractory nausea / vomiting. Start on day 3 after chemo. 30 tablet 1   oxyCODONE (ROXICODONE) 5 MG immediate release tablet Take 1 tablet (5 mg total) by mouth every 6 (six)  hours as needed for severe pain. 10 tablet 0   prochlorperazine (COMPAZINE) 10 MG tablet Take 1 tablet (10 mg total) by mouth every 6 (six) hours as needed (Nausea or vomiting). 30 tablet 1   simvastatin (ZOCOR) 20 MG tablet Take 1 tablet (20 mg total) by mouth every evening. 30 tablet 2   No current facility-administered medications for this visit.    PHYSICAL EXAMINATION: ECOG PERFORMANCE STATUS: 1 -  Symptomatic but completely ambulatory  Vitals:   07/23/21 1051  BP: (!) 144/73  Pulse: 71  Resp: 16  Temp: (!) 97.2 F (36.2 C)  SpO2: 99%   Filed Weights   07/23/21 1051  Weight: 166 lb 14.4 oz (75.7 kg)      LABORATORY DATA:  I have reviewed the data as listed    Latest Ref Rng & Units 06/18/2021    1:30 PM 05/29/2021   12:17 PM 12/28/2010   12:17 AM  CMP  Glucose 70 - 99 mg/dL 97  91  94   BUN 8 - 23 mg/dL _0 Creatinine 0.44 - 1.00 mg/dL 0.88  0.79  0.80   Sodium 135 - 145 mmol/L 141  138  142   Potassium 3.5 - 5.1 mmol/L 3.8  3.7  3.9   Chloride 98 - 111 mmol/L 105  103  104   CO2 22 - 32 mmol/L 28  29    Calcium 8.9 - 10.3 mg/dL 9.6  9.6    Total Protein 6.5 - 8.1 g/dL  7.5    Total Bilirubin 0.3 - 1.2 mg/dL  0.7    Alkaline Phos 38 - 126 U/L  32    AST 15 - 41 U/L  19    ALT 0 - 44 U/L  15      Lab Results  Component Value Date   WBC 6.8 06/18/2021   HGB 11.6 (L) 06/18/2021   HCT 34.6 (L) 06/18/2021   MCV 93.3 06/18/2021   PLT 300 06/18/2021   NEUTROABS 2.6 05/29/2021    ASSESSMENT & PLAN:  Malignant neoplasm of upper-outer quadrant of left breast in female, estrogen receptor negative ([place]) 05/24/2021:Screening mammogram detected left UOQ mass 1.5 cm at 2 o'clock position, axilla negative, ultrasound biopsy: Grade 3 IDC ER 0%, PR 0%, HER2 equivocal, FISH pending, Ki-67 40% 06/26/21: Left Lumpectomy: Grade 3 IDC 1.5 cm, margins Neg, 0/2 LN Neg, ER 0%, PR 0%, Her 2 Neg, Ki 67: 40%  Treatment Plan: 1. adjuvant chemotherapy with Taxotere and Cytoxan every 3 weeks x4 2.  Adjuvant radiation ---------------------------------------------------------------------------------------------------------------- Current Treatment: Cycle 1 TC to start 07/29/2021 Labs reviewed, chemo consent obtained, chemo education completed, Anti emetics were discussed. RTC In 1 week for cycle 2    No orders of the defined types were placed in this encounter.  The patient  has a good understanding of the overall plan. she agrees with it. she will call with any problems that may develop before the next visit here. Total time spent: 30 mins including face to face time and time spent for planning, charting and co-ordination of care   Harriette Ohara, MD 07/23/21    I Gardiner Coins am scribing for Dr. Lindi Adie  I have reviewed the above documentation for accuracy and completeness, and I agree with the above.

## 2021-07-11 ENCOUNTER — Encounter (HOSPITAL_COMMUNITY): Payer: Self-pay

## 2021-07-12 ENCOUNTER — Encounter: Payer: Self-pay | Admitting: *Deleted

## 2021-07-12 ENCOUNTER — Telehealth: Payer: Self-pay | Admitting: Hematology and Oncology

## 2021-07-12 NOTE — Telephone Encounter (Signed)
Called pt to sch per inbasket,and secure chat request, vmail full, pt will get appts when she comes for pat edu which she is aware of

## 2021-07-22 ENCOUNTER — Encounter: Payer: Self-pay | Admitting: Physical Therapy

## 2021-07-22 ENCOUNTER — Ambulatory Visit: Payer: Medicare Other | Attending: General Surgery | Admitting: Physical Therapy

## 2021-07-22 ENCOUNTER — Encounter: Payer: Self-pay | Admitting: *Deleted

## 2021-07-22 DIAGNOSIS — R293 Abnormal posture: Secondary | ICD-10-CM | POA: Diagnosis not present

## 2021-07-22 DIAGNOSIS — Z483 Aftercare following surgery for neoplasm: Secondary | ICD-10-CM | POA: Insufficient documentation

## 2021-07-22 DIAGNOSIS — Z170 Estrogen receptor positive status [ER+]: Secondary | ICD-10-CM | POA: Diagnosis not present

## 2021-07-22 DIAGNOSIS — C50412 Malignant neoplasm of upper-outer quadrant of left female breast: Secondary | ICD-10-CM | POA: Diagnosis not present

## 2021-07-22 NOTE — Patient Instructions (Signed)
[facility]  [address]  [place] [address]  [phone]  After Breast Cancer Class It is recommended you attend the ABC class to be educated on lymphedema risk reduction. This class is free of charge and lasts for 1 hour. It is a 1-time class. You will need to download the Webex app either on your phone or computer. We will send you a link the night before or the morning of the class. You should be able to click on that link to join the class. This is not a confidential class. You don't have to turn your camera on, but other participants may be able to see your email address. I'll give you the info in writing and you can call me with questions.  Scar massage You can begin gentle scar massage to you incision sites. Gently place one hand on the incision and move the skin (without sliding on the skin) in various directions. Do this for a few minutes and then you can gently massage either coconut oil or vitamin E cream into the scars.  Compression garment You should continue wearing your compression bra until you feel like you no longer have swelling.  Home exercise Program Continue doing the exercises you were given until you feel like you can do them without feeling any tightness at the end.   Walking Program Studies show that 30 minutes of walking per day (fast enough to elevate your heart rate) can significantly reduce the risk of a cancer recurrence. If you can't walk due to other medical reasons, we encourage you to find another activity you could do (like a stationary bike or water exercise).  Posture After breast cancer surgery, people frequently sit with rounded shoulders posture because it puts their incisions on slack and feels better. If you sit like this and scar tissue forms in that position, you can become very tight and have pain sitting or standing with good posture. Try to be aware of your posture and sit and stand up tall to heal  properly.  Follow up PT: It is recommended you return every 3 months for the first 3 years following surgery to be assessed on the SOZO machine for an L-Dex score. This helps prevent clinically significant lymphedema in 95% of patients. These follow up screens are 10 minute appointments that you are not billed for.

## 2021-07-22 NOTE — Therapy (Signed)
OUTPATIENT PHYSICAL THERAPY BREAST CANCER POST OP FOLLOW UP   Patient Name: Sheri Fleming MRN: [id number] DOB:Apr 10, 1950, 71 y.o., female Today's Date: 07/22/2021   PT End of Session - 07/22/21 1500     Visit Number 2    Number of Visits 2    PT Start Time 1402    PT Stop Time 1456    PT Time Calculation (min) 54 min    Activity Tolerance Patient tolerated treatment well    Behavior During Therapy [facility] for tasks assessed/performed             Past Medical History:  Diagnosis Date   Arthritis    Asthma    Cancer ([place]) 05/22/2021   breast cancer   High cholesterol    Hypertension    Sleep apnea    Past Surgical History:  Procedure Laterality Date   ABDOMINAL HYSTERECTOMY     BREAST LUMPECTOMY WITH RADIOACTIVE SEED AND SENTINEL LYMPH NODE BIOPSY Left 06/26/2021   Procedure: LEFT BREAST LUMPECTOMY WITH RADIOACTIVE SEED AND SENTINEL LYMPH NODE BIOPSY;  Surgeon: Jovita Kussmaul, MD;  Location: [place];  Service: General;  Laterality: Left;  GEN & PEC BLOCK   FRACTURE SURGERY Left    left lower leg   PORTACATH PLACEMENT N/A 06/26/2021   Procedure: PORT PLACEMENT;  Surgeon: Jovita Kussmaul, MD;  Location: [place];  Service: General;  Laterality: N/A;   Patient Active Problem List   Diagnosis Date Noted   Genetic testing 06/11/2021   Malignant neoplasm of upper-outer quadrant of left breast in female, estrogen receptor negative ([place]) 05/24/2021   High blood pressure 05/21/2021   Asthma 05/21/2021   Osteoporosis 05/21/2021   Hyperlipidemia 05/21/2021     REFERRING PROVIDER: Dr. Autumn Messing  REFERRING DIAG: Left breast cancer  THERAPY DIAG:  Malignant neoplasm of upper-outer quadrant of left breast in female, estrogen receptor positive ([place])  Abnormal posture  Aftercare following surgery for neoplasm  Rationale for Evaluation and Treatment Rehabilitation  ONSET DATE: 06/26/2021  SUBJECTIVE:                                                                                                                                                                                            SUBJECTIVE STATEMENT: Patient reports she underwent a left lumpectomy and sentinel node biopsy (2 negative nodes) on 06/26/2021. Her breast cancer is triple negative so she will undergo chemotherapy followed by radiation. Chemo starts 07/29/2021.  PERTINENT HISTORY:  Patient was diagnosed on 05/04/2021 with left grade III invasive ductal carcinoma breast cancer. She underwent a left lumpectomy and sentinel node biopsy (2 negative nodes) on  06/26/2021.Marland Kitchen It is triple negative with a Ki67 of 40%.   PATIENT GOALS:  Reassess how my recovery is going related to arm function, pain, and swelling.  PAIN:  Are you having pain? Yes: NPRS scale: 7/10 Pain location: left axilla Pain description: achy Aggravating factors: reaching overhead Relieving factors: nothing, maybe Tylenol  PRECAUTIONS: Recent Surgery, left UE Lymphedema risk  ACTIVITY LEVEL / LEISURE: She walks daily for 30-45 min   OBJECTIVE:   PATIENT SURVEYS:  QUICK DASH:  Quick Dash - 07/22/21 0001     Open a tight or new jar No difficulty    Do heavy household chores (wash walls, wash floors) No difficulty    Carry a shopping bag or briefcase No difficulty    Wash your back No difficulty    Use a knife to cut food No difficulty    Recreational activities in which you take some force or impact through your arm, shoulder, or hand (golf, hammering, tennis) No difficulty    During the past week, to what extent has your arm, shoulder or hand problem interfered with your normal social activities with family, friends, neighbors, or groups? Not at all    During the past week, to what extent has your arm, shoulder or hand problem limited your work or other regular daily activities Not at all    Arm, shoulder, or hand pain. Mild    Tingling (pins and needles) in your arm, shoulder, or hand None    Difficulty Sleeping No difficulty     DASH Score 2.27 %              OBSERVATIONS:  Port-a-cath and left lateral breast incisions both appear to be healing well. Glue still present over both incisions. Mild edema present left lateral breast. Applied compression foam pad between her bra and skin.  POSTURE:  Forward head and rounded shoulders posture  LYMPHEDEMA ASSESSMENT:   UPPER EXTREMITY AROM/PROM:   A/PROM RIGHT  05/29/2021    Shoulder extension 42  Shoulder flexion 158  Shoulder abduction 156  Shoulder internal rotation 68  Shoulder external rotation 68                          (Blank rows = not tested)   A/PROM LEFT  05/29/2021 LEFT 07/22/2021  Shoulder extension 45 46  Shoulder flexion 148 142  Shoulder abduction 161 152  Shoulder internal rotation 80 75  Shoulder external rotation 62 75                          (Blank rows = not tested)     CERVICAL AROM: All within normal limits   UPPER EXTREMITY STRENGTH: WNL     LYMPHEDEMA ASSESSMENTS:    LANDMARK RIGHT  05/29/2021 RIGHT 07/22/2021  10 cm proximal to olecranon process 30.2 30.7  Olecranon process 24.5 24.5  10 cm proximal to ulnar styloid process 23.9 22.8  Just proximal to ulnar styloid process 15.7 15.6  Across hand at thumb web space 18.5 17.8  At base of 2nd digit 6.2 6.3  (Blank rows = not tested)   LANDMARK LEFT  05/29/2021 LEFT 07/22/2021  10 cm proximal to olecranon process 30.9 31.3  Olecranon process 24.8 25.3  10 cm proximal to ulnar styloid process 23.5 23.5  Just proximal to ulnar styloid process 16.3 16  Across hand at thumb web space 18.2 17.6  At base of 2nd digit  6.2 6.1  (Blank rows = not tested)     Surgery type/Date: 06/26/2021 left lumpectomy and sentinel node biopsy Number of lymph nodes removed: 2 Current/past treatment (chemo, radiation, hormone therapy): none Other symptoms:  Heaviness/tightness No Pain Yes Pitting edema No Infections No Decreased scar mobility Yes Stemmer sign No   PATIENT EDUCATION:   Education details: Reviewed HEP Person educated: Patient and Spouse Education method: Customer service manager Education comprehension: verbalized understanding and returned demonstration   HOME EXERCISE PROGRAM:  Reviewed previously given post op HEP.   ASSESSMENT:  CLINICAL IMPRESSION: Patient reports she is doing well s/p left lumpectomy and sentinel node biopsy on 06/26/2021. Her incision is well healed, her shoulder ROM is nearly back to baseline, and there are no signs of lymphedema. She will begin chemotherapy next week followed by radiation. She is pleased with her progress. There is no further need for PT at this time. She declined the ABC class due to technology concerns but was issued the information in writing.  Pt will benefit from skilled therapeutic intervention to improve on the following deficits: Decreased knowledge of precautions, impaired UE functional use, pain, decreased ROM, postural dysfunction.   PT treatment/interventions: ADL/Self care home management, Therapeutic exercises, Patient/Family education, and Re-evaluation     GOALS: Goals reviewed with patient? Yes  LONG TERM GOALS:  (STG=LTG)  GOALS Name Target Date  Goal status  1 Pt will demonstrate she has regained full shoulder ROM and function post operatively compared to baselines.  Baseline: 08/23/2021 MET     PLAN: PT FREQUENCY/DURATION: N/A  PLAN FOR NEXT SESSION: D/C   [facility]  [address]  [place] [address]  [phone]  After Breast Cancer Class It is recommended you attend the ABC class to be educated on lymphedema risk reduction. This class is free of charge and lasts for 1 hour. It is a 1-time class. You will need to download the Webex app either on your phone or computer. We will send you a link the night before or the morning of the class. You should be able to click on that link to join the class. This is not a confidential class.  You don't have to turn your camera on, but other participants may be able to see your email address.  Scar massage You can begin gentle scar massage to you incision sites. Gently place one hand on the incision and move the skin (without sliding on the skin) in various directions. Do this for a few minutes and then you can gently massage either coconut oil or vitamin E cream into the scars.  Compression garment You should continue wearing your compression bra until you feel like you no longer have swelling.  Home exercise Program Continue doing the exercises you were given until you feel like you can do them without feeling any tightness at the end.   Walking Program Studies show that 30 minutes of walking per day (fast enough to elevate your heart rate) can significantly reduce the risk of a cancer recurrence. If you can't walk due to other medical reasons, we encourage you to find another activity you could do (like a stationary bike or water exercise).  Posture After breast cancer surgery, people frequently sit with rounded shoulders posture because it puts their incisions on slack and feels better. If you sit like this and scar tissue forms in that position, you can become very tight and have pain sitting or standing with good posture.  Try to be aware of your posture and sit and stand up tall to heal properly.  Follow up PT: It is recommended you return every 3 months for the first 3 years following surgery to be assessed on the SOZO machine for an L-Dex score. This helps prevent clinically significant lymphedema in 95% of patients. These follow up screens are 10 minute appointments that you are not billed for.  PHYSICAL THERAPY DISCHARGE SUMMARY  Visits from Start of Care: 2  Current functional level related to goals / functional outcomes: Goals met. See above for objective measurements.   Remaining deficits: Mild edema present in left lateral breast.   Education / Equipment: HEP  review and lymphedema risk reduction   Patient agrees to discharge. Patient goals were met. Patient is being discharged due to meeting the stated rehab goals.  Annia Friendly, [place] 07/22/21 3:00 PM

## 2021-07-22 NOTE — Progress Notes (Signed)
Pharmacist Chemotherapy Monitoring - Initial Assessment    Anticipated start date: 07/29/21   The following has been reviewed per standard work regarding the patient's treatment regimen: The patient's diagnosis, treatment plan and drug doses, and organ/hematologic function Lab orders and baseline tests specific to treatment regimen  The treatment plan start date, drug sequencing, and pre-medications Prior authorization status  Patient's documented medication list, including drug-drug interaction screen and prescriptions for anti-emetics and supportive care specific to the treatment regimen The drug concentrations, fluid compatibility, administration routes, and timing of the medications to be used The patient's access for treatment and lifetime cumulative dose history, if applicable  The patient's medication allergies and previous infusion related reactions, if applicable   Changes made to treatment plan:  N/A  Follow up needed:  Pending authorization for treatment    Larene Beach, RPH, 07/22/2021  10:40 AM

## 2021-07-23 ENCOUNTER — Inpatient Hospital Stay: Payer: Medicare Other

## 2021-07-23 ENCOUNTER — Inpatient Hospital Stay: Payer: Medicare Other | Attending: Hematology and Oncology | Admitting: Hematology and Oncology

## 2021-07-23 ENCOUNTER — Other Ambulatory Visit: Payer: Self-pay

## 2021-07-23 DIAGNOSIS — Z5111 Encounter for antineoplastic chemotherapy: Secondary | ICD-10-CM | POA: Insufficient documentation

## 2021-07-23 DIAGNOSIS — C50412 Malignant neoplasm of upper-outer quadrant of left female breast: Secondary | ICD-10-CM | POA: Diagnosis not present

## 2021-07-23 DIAGNOSIS — Z171 Estrogen receptor negative status [ER-]: Secondary | ICD-10-CM | POA: Insufficient documentation

## 2021-07-23 DIAGNOSIS — Z5189 Encounter for other specified aftercare: Secondary | ICD-10-CM | POA: Diagnosis not present

## 2021-07-23 DIAGNOSIS — R0602 Shortness of breath: Secondary | ICD-10-CM | POA: Insufficient documentation

## 2021-07-23 DIAGNOSIS — R079 Chest pain, unspecified: Secondary | ICD-10-CM | POA: Insufficient documentation

## 2021-07-23 NOTE — Assessment & Plan Note (Addendum)
05/24/2021:Screening mammogram detected left UOQ mass 1.5 cm at 2 o'clock position, axilla negative, ultrasound biopsy: Grade 3 IDC ER 0%, PR 0%, HER2 equivocal, FISH pending, Ki-67 40% 06/26/21: Left Lumpectomy: Grade 3 IDC 1.5 cm, margins Neg, 0/2 LN Neg, ER 0%, PR 0%, Her 2 Neg, Ki 67: 40%  Treatment Plan: 1. adjuvant chemotherapy with Taxotere and Cytoxan every 3 weeks x4 2.  Adjuvant radiation ---------------------------------------------------------------------------------------------------------------- Current Treatment: Cycle 1 TC to start 07/29/2021 Labs reviewed, chemo consent obtained, chemo education completed, Anti emetics were discussed. RTC In 1 week for cycle 2

## 2021-07-25 ENCOUNTER — Ambulatory Visit: Payer: Medicare Other | Admitting: Hematology and Oncology

## 2021-07-25 DIAGNOSIS — Z171 Estrogen receptor negative status [ER-]: Secondary | ICD-10-CM | POA: Diagnosis not present

## 2021-07-25 DIAGNOSIS — C50412 Malignant neoplasm of upper-outer quadrant of left female breast: Secondary | ICD-10-CM | POA: Diagnosis not present

## 2021-07-25 NOTE — Progress Notes (Signed)
Neulasta (brand pegfilgrastim) has been selected for use in this patient (insur pref).  Kennith Center, Pharm.D., CPP 07/25/2021'@11'$ :00 AM

## 2021-07-26 ENCOUNTER — Other Ambulatory Visit: Payer: Self-pay

## 2021-07-26 MED FILL — Dexamethasone Sodium Phosphate Inj 100 MG/10ML: INTRAMUSCULAR | Qty: 1 | Status: AC

## 2021-07-29 ENCOUNTER — Encounter: Payer: Self-pay | Admitting: *Deleted

## 2021-07-29 ENCOUNTER — Inpatient Hospital Stay: Payer: Medicare Other

## 2021-07-29 ENCOUNTER — Other Ambulatory Visit: Payer: Self-pay

## 2021-07-29 ENCOUNTER — Inpatient Hospital Stay (HOSPITAL_BASED_OUTPATIENT_CLINIC_OR_DEPARTMENT_OTHER): Payer: Medicare Other | Admitting: Physician Assistant

## 2021-07-29 VITALS — BP 139/80 | HR 80 | Temp 98.6°F | Resp 18 | Wt 166.0 lb

## 2021-07-29 DIAGNOSIS — T50905A Adverse effect of unspecified drugs, medicaments and biological substances, initial encounter: Secondary | ICD-10-CM

## 2021-07-29 DIAGNOSIS — Z5111 Encounter for antineoplastic chemotherapy: Secondary | ICD-10-CM | POA: Diagnosis not present

## 2021-07-29 DIAGNOSIS — Z171 Estrogen receptor negative status [ER-]: Secondary | ICD-10-CM | POA: Diagnosis not present

## 2021-07-29 DIAGNOSIS — Z5189 Encounter for other specified aftercare: Secondary | ICD-10-CM | POA: Diagnosis not present

## 2021-07-29 DIAGNOSIS — C50412 Malignant neoplasm of upper-outer quadrant of left female breast: Secondary | ICD-10-CM | POA: Diagnosis not present

## 2021-07-29 DIAGNOSIS — R0602 Shortness of breath: Secondary | ICD-10-CM | POA: Diagnosis not present

## 2021-07-29 DIAGNOSIS — R079 Chest pain, unspecified: Secondary | ICD-10-CM | POA: Diagnosis not present

## 2021-07-29 LAB — CBC WITH DIFFERENTIAL/PLATELET
Abs Immature Granulocytes: 0.03 10*3/uL (ref 0.00–0.07)
Basophils Absolute: 0.1 10*3/uL (ref 0.0–0.1)
Basophils Relative: 1 %
Eosinophils Absolute: 0 10*3/uL (ref 0.0–0.5)
Eosinophils Relative: 0 %
HCT: 34 % — ABNORMAL LOW (ref 36.0–46.0)
Hemoglobin: 11.8 g/dL — ABNORMAL LOW (ref 12.0–15.0)
Immature Granulocytes: 0 %
Lymphocytes Relative: 33 %
Lymphs Abs: 3.4 10*3/uL (ref 0.7–4.0)
MCH: 31.2 pg (ref 26.0–34.0)
MCHC: 34.7 g/dL (ref 30.0–36.0)
MCV: 89.9 fL (ref 80.0–100.0)
Monocytes Absolute: 1.3 10*3/uL — ABNORMAL HIGH (ref 0.1–1.0)
Monocytes Relative: 12 %
Neutro Abs: 5.5 10*3/uL (ref 1.7–7.7)
Neutrophils Relative %: 54 %
Platelets: 293 10*3/uL (ref 150–400)
RBC: 3.78 MIL/uL — ABNORMAL LOW (ref 3.87–5.11)
RDW: 12.7 % (ref 11.5–15.5)
WBC: 10.3 10*3/uL (ref 4.0–10.5)
nRBC: 0.4 % — ABNORMAL HIGH (ref 0.0–0.2)

## 2021-07-29 LAB — COMPREHENSIVE METABOLIC PANEL
ALT: 10 U/L (ref 0–44)
AST: 15 U/L (ref 15–41)
Albumin: 4.5 g/dL (ref 3.5–5.0)
Alkaline Phosphatase: 35 U/L — ABNORMAL LOW (ref 38–126)
Anion gap: 9 (ref 5–15)
BUN: 16 mg/dL (ref 8–23)
CO2: 26 mmol/L (ref 22–32)
Calcium: 10 mg/dL (ref 8.9–10.3)
Chloride: 101 mmol/L (ref 98–111)
Creatinine, Ser: 0.78 mg/dL (ref 0.44–1.00)
GFR, Estimated: >60 mL/min (ref >60)
Glucose, Bld: 91 mg/dL (ref 70–99)
Potassium: 3.6 mmol/L (ref 3.5–5.1)
Sodium: 136 mmol/L (ref 135–145)
Total Bilirubin: 0.6 mg/dL (ref 0.3–1.2)
Total Protein: 7.7 g/dL (ref 6.5–8.1)

## 2021-07-29 MED ORDER — SODIUM CHLORIDE 0.9 % IV SOLN: Freq: Once | INTRAVENOUS | Status: AC

## 2021-07-29 MED ORDER — SODIUM CHLORIDE 0.9 % IV SOLN
600.0000 mg/m2 | Freq: Once | INTRAVENOUS | Status: AC
  Administered 2021-07-29: 1080 mg via INTRAVENOUS
  Filled 2021-07-29: qty 54

## 2021-07-29 MED ORDER — SODIUM CHLORIDE 0.9% FLUSH
10.0000 mL | INTRAVENOUS | Status: DC | PRN
  Administered 2021-07-29: 10 mL

## 2021-07-29 MED ORDER — SODIUM CHLORIDE 0.9 % IV SOLN: Freq: Once | INTRAVENOUS | Status: DC | PRN

## 2021-07-29 MED ORDER — PALONOSETRON HCL INJECTION 0.25 MG/5ML
0.2500 mg | Freq: Once | INTRAVENOUS | Status: AC
  Administered 2021-07-29: 0.25 mg via INTRAVENOUS
  Filled 2021-07-29: qty 5

## 2021-07-29 MED ORDER — SODIUM CHLORIDE 0.9% FLUSH
10.0000 mL | INTRAVENOUS | Status: DC | PRN
  Administered 2021-07-29: 10 mL via INTRAVENOUS

## 2021-07-29 MED ORDER — SODIUM CHLORIDE 0.9 % IV SOLN
75.0000 mg/m2 | Freq: Once | INTRAVENOUS | Status: AC
  Administered 2021-07-29: 140 mg via INTRAVENOUS
  Filled 2021-07-29: qty 14

## 2021-07-29 MED ORDER — SODIUM CHLORIDE 0.9 % IV SOLN
10.0000 mg | Freq: Once | INTRAVENOUS | Status: AC
  Administered 2021-07-29: 10 mg via INTRAVENOUS
  Filled 2021-07-29: qty 10

## 2021-07-29 MED ORDER — FAMOTIDINE IN NACL 20-0.9 MG/50ML-% IV SOLN
20.0000 mg | Freq: Once | INTRAVENOUS | Status: AC | PRN
  Administered 2021-07-29: 20 mg via INTRAVENOUS

## 2021-07-29 MED ORDER — DIPHENHYDRAMINE HCL 50 MG/ML IJ SOLN
50.0000 mg | Freq: Once | INTRAMUSCULAR | Status: AC | PRN
  Administered 2021-07-29: 50 mg via INTRAVENOUS

## 2021-07-29 MED ORDER — HEPARIN SOD (PORK) LOCK FLUSH 100 UNIT/ML IV SOLN
500.0000 [IU] | Freq: Once | INTRAVENOUS | Status: AC | PRN
  Administered 2021-07-29: 500 [IU]

## 2021-07-30 ENCOUNTER — Encounter: Payer: Self-pay | Admitting: Hematology and Oncology

## 2021-07-31 ENCOUNTER — Telehealth: Payer: Self-pay

## 2021-07-31 ENCOUNTER — Encounter: Payer: Self-pay | Admitting: Hematology and Oncology

## 2021-07-31 ENCOUNTER — Other Ambulatory Visit: Payer: Self-pay

## 2021-07-31 ENCOUNTER — Inpatient Hospital Stay: Payer: Medicare Other

## 2021-07-31 VITALS — BP 155/89 | HR 86 | Temp 98.3°F | Resp 18

## 2021-07-31 DIAGNOSIS — C50412 Malignant neoplasm of upper-outer quadrant of left female breast: Secondary | ICD-10-CM | POA: Diagnosis not present

## 2021-07-31 DIAGNOSIS — Z5111 Encounter for antineoplastic chemotherapy: Secondary | ICD-10-CM | POA: Diagnosis not present

## 2021-07-31 DIAGNOSIS — Z171 Estrogen receptor negative status [ER-]: Secondary | ICD-10-CM | POA: Diagnosis not present

## 2021-07-31 DIAGNOSIS — R079 Chest pain, unspecified: Secondary | ICD-10-CM | POA: Diagnosis not present

## 2021-07-31 DIAGNOSIS — R0602 Shortness of breath: Secondary | ICD-10-CM | POA: Diagnosis not present

## 2021-07-31 DIAGNOSIS — Z5189 Encounter for other specified aftercare: Secondary | ICD-10-CM | POA: Diagnosis not present

## 2021-07-31 MED ORDER — PEGFILGRASTIM INJECTION 6 MG/0.6ML ~~LOC~~
6.0000 mg | PREFILLED_SYRINGE | Freq: Once | SUBCUTANEOUS | Status: AC
  Administered 2021-07-31: 6 mg via SUBCUTANEOUS
  Filled 2021-07-31: qty 0.6

## 2021-07-31 NOTE — Telephone Encounter (Signed)
Sheri Fleming states that she is fine. She is eating, drinking, and urinating well.  She knows to call the office at [phone] if she has any questions or concerns.

## 2021-07-31 NOTE — Progress Notes (Signed)
Pt is approved for the $1000 [employer] grant.  

## 2021-07-31 NOTE — Telephone Encounter (Signed)
-----   Message from Willis Modena, RN sent at 07/29/2021  4:15 PM EDT ----- Regarding: Dr. Lindi Adie 1st time Docetaxel & Cytoxan Pt call back Dr. Lindi Adie 1st time Docetaxel/Cytoxan. Pt had hypersensitivity rxn to Docetaxel--inf discontinued. Pt tolerated Cytoxan well without incident. See hypersensitivity note from 07/26/21 at 1418. Pt call back due on 07/30/21.

## 2021-08-06 NOTE — Progress Notes (Addendum)
Patient Care Team: Donell Beers, FNP as PCP - General (Nurse Practitioner) Pershing Proud, RN as Oncology Nurse Navigator Donnelly Angelica, RN as Oncology Nurse Navigator Griselda Miner, MD as Consulting Physician (General Surgery) Serena Croissant, MD as Consulting Physician (Hematology and Oncology) Dorothy Puffer, MD as Consulting Physician (Radiation Oncology)  DIAGNOSIS:  Encounter Diagnosis  Name Primary?   Malignant neoplasm of upper-outer quadrant of left breast in female, estrogen receptor negative (H[facility])     SUMMARY OF ONCOLOGIC HISTORY: Oncology History  Malignant neoplasm of upper-outer quadrant of left breast in female, estrogen receptor negative ([facility])  05/24/2021 Initial Diagnosis   Screening mammogram detected left UOQ mass 1.5 cm at 2 o'clock position, axilla negative, ultrasound biopsy: Grade 3 IDC ER 0%, PR 0%, HER2 equivocal, FISH pending, Ki-67 40%   05/29/2021 Cancer Staging   Staging form: Breast, AJCC 8th Edition - Clinical: Stage IB (cT1c, cN0, cM0, G3, ER-, PR-, HER2: Equivocal) - Signed by Serena Croissant, MD on 05/29/2021 Stage prefix: Initial diagnosis Histologic grading system: 3 grade system   06/10/2021 Genetic Testing   Negative hereditary cancer genetic testing: no pathogenic variants detected in [facility] CustomNext-Cancer +RNAinsight Panel.  Report date is Jun 10, 2021.   The CustomNext-Cancer+RNAinsight panel offered by [facility] includes sequencing and rearrangement analysis for the following 47 genes:  APC, ATM, AXIN2, BARD1, BMPR1A, BRCA1, BRCA2, BRIP1, CDH1, CDK4, CDKN2A, CHEK2, DICER1, EPCAM, GREM1, HOXB13, MEN1, MLH1, MSH2, MSH3, MSH6, MUTYH, NBN, NF1, NF2, NTHL1, PALB2, PMS2, POLD1, POLE, PTEN, RAD51C, RAD51D, RECQL, RET, SDHA, SDHAF2, SDHB, SDHC, SDHD, SMAD4, SMARCA4, STK11, TP53, TSC1, TSC2, and VHL.  RNA data is routinely analyzed for use in variant interpretation for all genes.   06/26/2021 Surgery   Left Lumpectomy: Grade 3 IDC 1.5 cm,  margins Neg, 0/2 LN Neg, ER 0%, PR 0%, Her 2 Neg, Ki 67: 40%   07/29/2021 -  Chemotherapy   Patient is on Treatment Plan : BREAST TC q21d       CHIEF COMPLIANT: Cycle 2 TC  INTERVAL HISTORY: Sheri Fleming is a 71 y.o. female is here because of recent diagnosis of left breast cancer. She presents to the clinic today for a follow-up and treatment. She states that she did fine last treatment. Energy levels and appetite and taste is still fine.  She had a severe reaction to Taxotere while she is getting the treatment with the first cycle.  She within a minute after starting the treatment she felt chest pain and shortness of breath and felt like impending doom.  The treatment was stopped years and she was given Benadryl and slowly she improved and they did not rechallenge her and she was given Cytoxan and the treatment was completed.   ALLERGIES:  is allergic to taxotere [docetaxel].  MEDICATIONS:  Current Outpatient Medications  Medication Sig Dispense Refill   acetaminophen (TYLENOL) 500 MG tablet Take 500 mg by mouth every 6 (six) hours as needed for mild pain.     alendronate (FOSAMAX) 70 MG tablet Take 70 mg by mouth once a week. Thursday  3   dexamethasone (DECADRON) 4 MG tablet Take 1 tablet (4 mg total) by mouth daily. Take 1 tablet day before chemo and 1 tablet day after chemo with food 30 tablet 1   lidocaine-prilocaine (EMLA) cream Apply to affected area once 30 g 3   lisinopril-hydrochlorothiazide (PRINZIDE,ZESTORETIC) 20-25 MG per tablet Take 1 tablet by mouth daily. 30 tablet 0   montelukast (SINGULAIR) 10 MG  tablet Take 1 tablet (10 mg total) by mouth at bedtime. 30 tablet 0   ondansetron (ZOFRAN) 8 MG tablet Take 1 tablet (8 mg total) by mouth 2 (two) times daily as needed for refractory nausea / vomiting. Start on day 3 after chemo. 30 tablet 1   oxyCODONE (ROXICODONE) 5 MG immediate release tablet Take 1 tablet (5 mg total) by mouth every 6 (six) hours as needed for severe  pain. 10 tablet 0   prochlorperazine (COMPAZINE) 10 MG tablet Take 1 tablet (10 mg total) by mouth every 6 (six) hours as needed (Nausea or vomiting). 30 tablet 1   simvastatin (ZOCOR) 20 MG tablet Take 1 tablet (20 mg total) by mouth every evening. 30 tablet 2   simvastatin (ZOCOR) 20 MG tablet Take by mouth.     No current facility-administered medications for this visit.    PHYSICAL EXAMINATION: ECOG PERFORMANCE STATUS: 1 - Symptomatic but completely ambulatory  Vitals:   08/19/21 1107  BP: (!) 170/74  Pulse: 97  Resp: 18  Temp: (!) 97.2 F (36.2 C)  SpO2: 100%   Filed Weights   08/19/21 1107  Weight: 164 lb 12.8 oz (74.8 kg)      LABORATORY DATA:  I have reviewed the data as listed    Latest Ref Rng & Units 08/12/2021   12:45 PM 07/29/2021   11:43 AM 06/18/2021    1:30 PM  CMP  Glucose 70 - 99 mg/dL 104  91  97   BUN 8 - 23 mg/dL $Remove'19  16  20   'NAaqsQM$ Creatinine 0.44 - 1.00 mg/dL 0.78  0.78  0.88   Sodium 135 - 145 mmol/L 135  136  141   Potassium 3.5 - 5.1 mmol/L 4.2  3.6  3.8   Chloride 98 - 111 mmol/L 100  101  105   CO2 22 - 32 mmol/L $RemoveB'28  26  28   'gyxLFMrM$ Calcium 8.9 - 10.3 mg/dL 9.6  10.0  9.6   Total Protein 6.5 - 8.1 g/dL 7.6  7.7    Total Bilirubin 0.3 - 1.2 mg/dL 0.6  0.6    Alkaline Phos 38 - 126 U/L 60  35    AST 15 - 41 U/L 11  15    ALT 0 - 44 U/L 12  10      Lab Results  Component Value Date   WBC 11.8 (H) 08/19/2021   HGB 11.4 (L) 08/19/2021   HCT 31.9 (L) 08/19/2021   MCV 89.6 08/19/2021   PLT 280 08/19/2021   NEUTROABS 7.6 08/19/2021    ASSESSMENT & PLAN:  Malignant neoplasm of upper-outer quadrant of left breast in female, estrogen receptor negative ([place]) 05/24/2021:Screening mammogram detected left UOQ mass 1.5 cm at 2 o'clock position, axilla negative, ultrasound biopsy: Grade 3 IDC ER 0%, PR 0%, HER2 equivocal, FISH pending, Ki-67 40% 06/26/21: Left Lumpectomy: Grade 3 IDC 1.5 cm, margins Neg, 0/2 LN Neg, ER 0%, PR 0%, Her 2 Neg, Ki 67: 40%    Treatment Plan: 1. adjuvant chemotherapy with Taxotere and Cytoxan every 3 weeks x4 2.  Adjuvant radiation ---------------------------------------------------------------------------------------------------------------- Current Treatment: Cycle 2 TC started 07/29/2021   Chemotoxicities: 1.  Bone pain from Neulasta 2. very mild fatigue 3.  Severe anaphylactic reaction to docetaxel.  We discontinued docetaxel and plan to substituted with Abraxane.   Return to clinic in 3 week for cycle 3      No orders of the defined types were placed in this encounter.  The  patient has a good understanding of the overall plan. she agrees with it. she will call with any problems that may develop before the next visit here. Total time spent: 30 mins including face to face time and time spent for planning, charting and co-ordination of care   Harriette Ohara, MD 08/19/21     I Gardiner Coins am scribing for Dr. Lindi Adie  I have reviewed the above documentation for accuracy and completeness, and I agree with the above.

## 2021-08-08 ENCOUNTER — Ambulatory Visit: Payer: Medicare Other | Admitting: Hematology and Oncology

## 2021-08-09 ENCOUNTER — Inpatient Hospital Stay: Payer: Medicare Other

## 2021-08-09 ENCOUNTER — Inpatient Hospital Stay: Payer: Medicare Other | Admitting: Hematology and Oncology

## 2021-08-12 ENCOUNTER — Inpatient Hospital Stay: Payer: Medicare Other | Attending: Hematology and Oncology

## 2021-08-12 ENCOUNTER — Other Ambulatory Visit: Payer: Self-pay

## 2021-08-12 ENCOUNTER — Inpatient Hospital Stay (HOSPITAL_BASED_OUTPATIENT_CLINIC_OR_DEPARTMENT_OTHER): Payer: Medicare Other | Admitting: Hematology and Oncology

## 2021-08-12 DIAGNOSIS — C50412 Malignant neoplasm of upper-outer quadrant of left female breast: Secondary | ICD-10-CM

## 2021-08-12 DIAGNOSIS — Z95828 Presence of other vascular implants and grafts: Secondary | ICD-10-CM

## 2021-08-12 DIAGNOSIS — Y92239 Unspecified place in hospital as the place of occurrence of the external cause: Secondary | ICD-10-CM | POA: Diagnosis not present

## 2021-08-12 DIAGNOSIS — Z5111 Encounter for antineoplastic chemotherapy: Secondary | ICD-10-CM | POA: Insufficient documentation

## 2021-08-12 DIAGNOSIS — Z171 Estrogen receptor negative status [ER-]: Secondary | ICD-10-CM

## 2021-08-12 DIAGNOSIS — Y939 Activity, unspecified: Secondary | ICD-10-CM | POA: Insufficient documentation

## 2021-08-12 DIAGNOSIS — T451X5A Adverse effect of antineoplastic and immunosuppressive drugs, initial encounter: Secondary | ICD-10-CM | POA: Diagnosis not present

## 2021-08-12 DIAGNOSIS — Z5189 Encounter for other specified aftercare: Secondary | ICD-10-CM | POA: Diagnosis not present

## 2021-08-12 DIAGNOSIS — T886XXA Anaphylactic reaction due to adverse effect of correct drug or medicament properly administered, initial encounter: Secondary | ICD-10-CM | POA: Diagnosis not present

## 2021-08-12 HISTORY — DX: Presence of other vascular implants and grafts: Z95.828

## 2021-08-12 LAB — CBC WITH DIFFERENTIAL/PLATELET
Abs Immature Granulocytes: 0.13 10*3/uL — ABNORMAL HIGH (ref 0.00–0.07)
Basophils Absolute: 0.1 10*3/uL (ref 0.0–0.1)
Basophils Relative: 1 %
Eosinophils Absolute: 0.1 10*3/uL (ref 0.0–0.5)
Eosinophils Relative: 0 %
HCT: 35.7 % — ABNORMAL LOW (ref 36.0–46.0)
Hemoglobin: 12.6 g/dL (ref 12.0–15.0)
Immature Granulocytes: 1 %
Lymphocytes Relative: 24 %
Lymphs Abs: 3 10*3/uL (ref 0.7–4.0)
MCH: 31.7 pg (ref 26.0–34.0)
MCHC: 35.3 g/dL (ref 30.0–36.0)
MCV: 89.7 fL (ref 80.0–100.0)
Monocytes Absolute: 1.3 10*3/uL — ABNORMAL HIGH (ref 0.1–1.0)
Monocytes Relative: 11 %
Neutro Abs: 7.7 10*3/uL (ref 1.7–7.7)
Neutrophils Relative %: 63 %
Platelets: 191 10*3/uL (ref 150–400)
RBC: 3.98 MIL/uL (ref 3.87–5.11)
RDW: 13.1 % (ref 11.5–15.5)
WBC: 12.4 10*3/uL — ABNORMAL HIGH (ref 4.0–10.5)
nRBC: 0 % (ref 0.0–0.2)

## 2021-08-12 LAB — COMPREHENSIVE METABOLIC PANEL
ALT: 12 U/L (ref 0–44)
AST: 11 U/L — ABNORMAL LOW (ref 15–41)
Albumin: 4.4 g/dL (ref 3.5–5.0)
Alkaline Phosphatase: 60 U/L (ref 38–126)
Anion gap: 7 (ref 5–15)
BUN: 19 mg/dL (ref 8–23)
CO2: 28 mmol/L (ref 22–32)
Calcium: 9.6 mg/dL (ref 8.9–10.3)
Chloride: 100 mmol/L (ref 98–111)
Creatinine, Ser: 0.78 mg/dL (ref 0.44–1.00)
GFR, Estimated: >60 mL/min (ref >60)
Glucose, Bld: 104 mg/dL — ABNORMAL HIGH (ref 70–99)
Potassium: 4.2 mmol/L (ref 3.5–5.1)
Sodium: 135 mmol/L (ref 135–145)
Total Bilirubin: 0.6 mg/dL (ref 0.3–1.2)
Total Protein: 7.6 g/dL (ref 6.5–8.1)

## 2021-08-12 MED ORDER — SODIUM CHLORIDE 0.9% FLUSH
10.0000 mL | Freq: Once | INTRAVENOUS | Status: AC
  Administered 2021-08-12: 10 mL

## 2021-08-12 MED ORDER — HEPARIN SOD (PORK) LOCK FLUSH 100 UNIT/ML IV SOLN
500.0000 [IU] | Freq: Once | INTRAVENOUS | Status: AC
  Administered 2021-08-12: 500 [IU]

## 2021-08-12 NOTE — Assessment & Plan Note (Addendum)
Malignant neoplasm of upper-outer quadrant of left breast in female, estrogen receptor negative ([place]) 05/24/2021:Screening mammogram detected left UOQ mass 1.5 cm at 2 o'clock position, axilla negative, ultrasound biopsy: Grade 3 IDC ER 0%, PR 0%, HER2 equivocal, FISH pending, Ki-67 40% 06/26/21: Left Lumpectomy: Grade 3 IDC 1.5 cm, margins Neg, 0/2 LN Neg, ER 0%, PR 0%, Her 2 Neg, Ki 67: 40%  Treatment Plan: 1. adjuvant chemotherapywith Taxotere and Cytoxan every 3 weeks x4 2.Adjuvant radiation ---------------------------------------------------------------------------------------------------------------- Current Treatment: Cycle 1 day 15 TC started 07/29/2021  Chemotoxicities: 1.  Bone pain from Neulasta 2. very mild fatigue  Return to clinic in 1 week for cycle 2  Return to clinic next week for cycle 2

## 2021-08-12 NOTE — Progress Notes (Signed)
Patient Care Team: Renee Rival, FNP as PCP - General (Nurse Practitioner) Mauro Kaufmann, RN as Oncology Nurse Navigator Rockwell Germany, RN as Oncology Nurse Navigator Jovita Kussmaul, MD as Consulting Physician (General Surgery) Nicholas Lose, MD as Consulting Physician (Hematology and Oncology) Kyung Rudd, MD as Consulting Physician (Radiation Oncology)  DIAGNOSIS:  Encounter Diagnosis  Name Primary?   Malignant neoplasm of upper-outer quadrant of left breast in female, estrogen receptor negative ([place])     SUMMARY OF ONCOLOGIC HISTORY: Oncology History  Malignant neoplasm of upper-outer quadrant of left breast in female, estrogen receptor negative ([place])  05/24/2021 Initial Diagnosis   Screening mammogram detected left UOQ mass 1.5 cm at 2 o'clock position, axilla negative, ultrasound biopsy: Grade 3 IDC ER 0%, PR 0%, HER2 equivocal, FISH pending, Ki-67 40%   05/29/2021 Cancer Staging   Staging form: Breast, AJCC 8th Edition - Clinical: Stage IB (cT1c, cN0, cM0, G3, ER-, PR-, HER2: Equivocal) - Signed by Nicholas Lose, MD on 05/29/2021 Stage prefix: Initial diagnosis Histologic grading system: 3 grade system   06/10/2021 Genetic Testing   Negative hereditary cancer genetic testing: no pathogenic variants detected in [facility] CustomNext-Cancer +RNAinsight Panel.  Report date is Jun 10, 2021.   The CustomNext-Cancer+RNAinsight panel offered by [facility] includes sequencing and rearrangement analysis for the following 47 genes:  APC, ATM, AXIN2, BARD1, BMPR1A, BRCA1, BRCA2, BRIP1, CDH1, CDK4, CDKN2A, CHEK2, DICER1, EPCAM, GREM1, HOXB13, MEN1, MLH1, MSH2, MSH3, MSH6, MUTYH, NBN, NF1, NF2, NTHL1, PALB2, PMS2, POLD1, POLE, PTEN, RAD51C, RAD51D, RECQL, RET, SDHA, SDHAF2, SDHB, SDHC, SDHD, SMAD4, SMARCA4, STK11, TP53, TSC1, TSC2, and VHL.  RNA data is routinely analyzed for use in variant interpretation for all genes.   06/26/2021 Surgery   Left Lumpectomy: Grade 3 IDC 1.5 cm,  margins Neg, 0/2 LN Neg, ER 0%, PR 0%, Her 2 Neg, Ki 67: 40%   07/29/2021 -  Chemotherapy   Patient is on Treatment Plan : BREAST TC q21d       CHIEF COMPLIANT: Follow-up cycle 1 day 15  INTERVAL HISTORY: Sheri Fleming is a 71 y.o. female is here because of recent diagnosis of left breast cancer. She presents to the clinic today for a follow-up. States that she tolerated treatment. Denies nausea no mouth sores and states that her bowels are moving normal. She has lost weight and has been drinking plenty of water. She forgot to take Claritin before the shot and she notice joint and body aches in her neck.  ALLERGIES:  is allergic to taxotere [docetaxel].  MEDICATIONS:  Current Outpatient Medications  Medication Sig Dispense Refill   acetaminophen (TYLENOL) 500 MG tablet Take 500 mg by mouth every 6 (six) hours as needed for mild pain.     alendronate (FOSAMAX) 70 MG tablet Take 70 mg by mouth once a week. Thursday  3   dexamethasone (DECADRON) 4 MG tablet Take 1 tablet (4 mg total) by mouth daily. Take 1 tablet day before chemo and 1 tablet day after chemo with food 30 tablet 1   lidocaine-prilocaine (EMLA) cream Apply to affected area once 30 g 3   lisinopril-hydrochlorothiazide (PRINZIDE,ZESTORETIC) 20-25 MG per tablet Take 1 tablet by mouth daily. 30 tablet 0   montelukast (SINGULAIR) 10 MG tablet Take 1 tablet (10 mg total) by mouth at bedtime. 30 tablet 0   ondansetron (ZOFRAN) 8 MG tablet Take 1 tablet (8 mg total) by mouth 2 (two) times daily as needed for refractory nausea / vomiting. Start on  day 3 after chemo. 30 tablet 1   oxyCODONE (ROXICODONE) 5 MG immediate release tablet Take 1 tablet (5 mg total) by mouth every 6 (six) hours as needed for severe pain. 10 tablet 0   prochlorperazine (COMPAZINE) 10 MG tablet Take 1 tablet (10 mg total) by mouth every 6 (six) hours as needed (Nausea or vomiting). 30 tablet 1   simvastatin (ZOCOR) 20 MG tablet Take 1 tablet (20 mg total) by  mouth every evening. 30 tablet 2   No current facility-administered medications for this visit.    PHYSICAL EXAMINATION: ECOG PERFORMANCE STATUS: 1 - Symptomatic but completely ambulatory  Vitals:   08/12/21 1316  BP: (!) 152/72  Pulse: 85  Resp: 18  Temp: (!) 97.2 F (36.2 C)  SpO2: 100%   Filed Weights   08/12/21 1316  Weight: 161 lb 3.2 oz (73.1 kg)      LABORATORY DATA:  I have reviewed the data as listed    Latest Ref Rng & Units 08/12/2021   12:45 PM 07/29/2021   11:43 AM 06/18/2021    1:30 PM  CMP  Glucose 70 - 99 mg/dL 104  91  97   BUN 8 - 23 mg/dL _0 Creatinine 0.44 - 1.00 mg/dL 0.78  0.78  0.88   Sodium 135 - 145 mmol/L 135  136  141   Potassium 3.5 - 5.1 mmol/L 4.2  3.6  3.8   Chloride 98 - 111 mmol/L 100  101  105   CO2 22 - 32 mmol/L _1 Calcium 8.9 - 10.3 mg/dL 9.6  10.0  9.6   Total Protein 6.5 - 8.1 g/dL 7.6  7.7    Total Bilirubin 0.3 - 1.2 mg/dL 0.6  0.6    Alkaline Phos 38 - 126 U/L 60  35    AST 15 - 41 U/L 11  15    ALT 0 - 44 U/L 12  10      Lab Results  Component Value Date   WBC 12.4 (H) 08/12/2021   HGB 12.6 08/12/2021   HCT 35.7 (L) 08/12/2021   MCV 89.7 08/12/2021   PLT 191 08/12/2021   NEUTROABS 7.7 08/12/2021    ASSESSMENT & PLAN:  Malignant neoplasm of upper-outer quadrant of left breast in female, estrogen receptor negative ([place]) Malignant neoplasm of upper-outer quadrant of left breast in female, estrogen receptor negative ([place]) 05/24/2021:Screening mammogram detected left UOQ mass 1.5 cm at 2 o'clock position, axilla negative, ultrasound biopsy: Grade 3 IDC ER 0%, PR 0%, HER2 equivocal, FISH pending, Ki-67 40% 06/26/21: Left Lumpectomy: Grade 3 IDC 1.5 cm, margins Neg, 0/2 LN Neg, ER 0%, PR 0%, Her 2 Neg, Ki 67: 40%   Treatment Plan: 1. adjuvant chemotherapy with Taxotere and Cytoxan every 3 weeks x4 2.  Adjuvant  radiation ---------------------------------------------------------------------------------------------------------------- Current Treatment: Cycle 1 day 15 TC started 07/29/2021  Chemotoxicities: 1.  Bone pain from Neulasta 2. very mild fatigue  Return to clinic in 1 week for cycle 2     No orders of the defined types were placed in this encounter.  The patient has a good understanding of the overall plan. she agrees with it. she will call with any problems that may develop before the next visit here. Total time spent: 30 mins including face to face time and time spent for planning, charting and co-ordination of care   Harriette Ohara, MD 08/12/21    I Deritra,  [place] am scribing for Dr. Lindi Adie  I have reviewed the above documentation for accuracy and completeness, and I agree with the above.

## 2021-08-16 MED FILL — Dexamethasone Sodium Phosphate Inj 100 MG/10ML: INTRAMUSCULAR | Qty: 1 | Status: AC

## 2021-08-19 ENCOUNTER — Inpatient Hospital Stay (HOSPITAL_BASED_OUTPATIENT_CLINIC_OR_DEPARTMENT_OTHER): Payer: Medicare Other | Admitting: Hematology and Oncology

## 2021-08-19 ENCOUNTER — Other Ambulatory Visit: Payer: Self-pay

## 2021-08-19 ENCOUNTER — Inpatient Hospital Stay: Payer: Medicare Other

## 2021-08-19 ENCOUNTER — Other Ambulatory Visit: Payer: Self-pay | Admitting: Hematology and Oncology

## 2021-08-19 VITALS — BP 155/88 | HR 88 | Resp 18

## 2021-08-19 DIAGNOSIS — Z171 Estrogen receptor negative status [ER-]: Secondary | ICD-10-CM

## 2021-08-19 DIAGNOSIS — Z5111 Encounter for antineoplastic chemotherapy: Secondary | ICD-10-CM | POA: Diagnosis not present

## 2021-08-19 DIAGNOSIS — T886XXA Anaphylactic reaction due to adverse effect of correct drug or medicament properly administered, initial encounter: Secondary | ICD-10-CM | POA: Diagnosis not present

## 2021-08-19 DIAGNOSIS — Z5189 Encounter for other specified aftercare: Secondary | ICD-10-CM | POA: Diagnosis not present

## 2021-08-19 DIAGNOSIS — Z95828 Presence of other vascular implants and grafts: Secondary | ICD-10-CM

## 2021-08-19 DIAGNOSIS — C50412 Malignant neoplasm of upper-outer quadrant of left female breast: Secondary | ICD-10-CM | POA: Diagnosis not present

## 2021-08-19 DIAGNOSIS — T451X5A Adverse effect of antineoplastic and immunosuppressive drugs, initial encounter: Secondary | ICD-10-CM | POA: Diagnosis not present

## 2021-08-19 LAB — CBC WITH DIFFERENTIAL/PLATELET
Abs Immature Granulocytes: 0.06 10*3/uL (ref 0.00–0.07)
Basophils Absolute: 0.1 10*3/uL (ref 0.0–0.1)
Basophils Relative: 1 %
Eosinophils Absolute: 0 10*3/uL (ref 0.0–0.5)
Eosinophils Relative: 0 %
HCT: 31.9 % — ABNORMAL LOW (ref 36.0–46.0)
Hemoglobin: 11.4 g/dL — ABNORMAL LOW (ref 12.0–15.0)
Immature Granulocytes: 1 %
Lymphocytes Relative: 22 %
Lymphs Abs: 2.6 10*3/uL (ref 0.7–4.0)
MCH: 32 pg (ref 26.0–34.0)
MCHC: 35.7 g/dL (ref 30.0–36.0)
MCV: 89.6 fL (ref 80.0–100.0)
Monocytes Absolute: 1.5 10*3/uL — ABNORMAL HIGH (ref 0.1–1.0)
Monocytes Relative: 13 %
Neutro Abs: 7.6 10*3/uL (ref 1.7–7.7)
Neutrophils Relative %: 63 %
Platelets: 280 10*3/uL (ref 150–400)
RBC: 3.56 MIL/uL — ABNORMAL LOW (ref 3.87–5.11)
RDW: 13.2 % (ref 11.5–15.5)
WBC: 11.8 10*3/uL — ABNORMAL HIGH (ref 4.0–10.5)
nRBC: 0 % (ref 0.0–0.2)

## 2021-08-19 LAB — COMPREHENSIVE METABOLIC PANEL
ALT: 12 U/L (ref 0–44)
AST: 11 U/L — ABNORMAL LOW (ref 15–41)
Albumin: 4.2 g/dL (ref 3.5–5.0)
Alkaline Phosphatase: 43 U/L (ref 38–126)
Anion gap: 6 (ref 5–15)
BUN: 16 mg/dL (ref 8–23)
CO2: 28 mmol/L (ref 22–32)
Calcium: 9.6 mg/dL (ref 8.9–10.3)
Chloride: 103 mmol/L (ref 98–111)
Creatinine, Ser: 0.76 mg/dL (ref 0.44–1.00)
GFR, Estimated: >60 mL/min (ref >60)
Glucose, Bld: 102 mg/dL — ABNORMAL HIGH (ref 70–99)
Potassium: 3.4 mmol/L — ABNORMAL LOW (ref 3.5–5.1)
Sodium: 137 mmol/L (ref 135–145)
Total Bilirubin: 0.5 mg/dL (ref 0.3–1.2)
Total Protein: 7.2 g/dL (ref 6.5–8.1)

## 2021-08-19 MED ORDER — SODIUM CHLORIDE 0.9 % IV SOLN
10.0000 mg | Freq: Once | INTRAVENOUS | Status: AC
  Administered 2021-08-19: 10 mg via INTRAVENOUS
  Filled 2021-08-19: qty 10

## 2021-08-19 MED ORDER — SODIUM CHLORIDE 0.9 % IV SOLN
600.0000 mg/m2 | Freq: Once | INTRAVENOUS | Status: AC
  Administered 2021-08-19: 1080 mg via INTRAVENOUS
  Filled 2021-08-19: qty 54

## 2021-08-19 MED ORDER — SODIUM CHLORIDE 0.9% FLUSH
10.0000 mL | Freq: Once | INTRAVENOUS | Status: AC
  Administered 2021-08-19: 10 mL

## 2021-08-19 MED ORDER — SODIUM CHLORIDE 0.9 % IV SOLN: Freq: Once | INTRAVENOUS | Status: AC

## 2021-08-19 MED ORDER — SODIUM CHLORIDE 0.9% FLUSH
10.0000 mL | INTRAVENOUS | Status: DC | PRN
  Administered 2021-08-19: 10 mL

## 2021-08-19 MED ORDER — HEPARIN SOD (PORK) LOCK FLUSH 100 UNIT/ML IV SOLN
500.0000 [IU] | Freq: Once | INTRAVENOUS | Status: AC | PRN
  Administered 2021-08-19: 500 [IU]

## 2021-08-19 MED ORDER — PALONOSETRON HCL INJECTION 0.25 MG/5ML
0.2500 mg | Freq: Once | INTRAVENOUS | Status: AC
  Administered 2021-08-19: 0.25 mg via INTRAVENOUS
  Filled 2021-08-19: qty 5

## 2021-08-19 MED ORDER — PACLITAXEL PROTEIN-BOUND CHEMO INJECTION 100 MG
300.0000 mg | Freq: Once | INTRAVENOUS | Status: AC
  Administered 2021-08-19: 300 mg via INTRAVENOUS
  Filled 2021-08-19: qty 60

## 2021-08-19 NOTE — Progress Notes (Signed)
Per patient preference we will not re-challenge Docetaxel at this time. Md [place] notified. Md will attempt to gain approval for Abraxane for today.

## 2021-08-19 NOTE — Addendum Note (Signed)
Addended by: Nicholas Lose on: 08/19/2021 12:28 PM   Modules accepted: Orders

## 2021-08-19 NOTE — Patient Instructions (Addendum)
[facility]  Discharge Instructions: Thank you for choosing [facility] to provide your oncology and hematology care.   If you have a lab appointment with the [facility], please go directly to the [facility] and check in at the registration area.   Wear comfortable clothing and clothing appropriate for easy access to any Portacath or PICC line.   We strive to give you quality time with your provider. You may need to reschedule your appointment if you arrive late (15 or more minutes).  Arriving late affects you and other patients whose appointments are after yours.  Also, if you miss three or more appointments without notifying the office, you may be dismissed from the clinic at the provider's discretion.      For prescription refill requests, have your pharmacy contact our office and allow 72 hours for refills to be completed.    Today you received the following chemotherapy and/or immunotherapy agents: Cytoxan, Abraxane.    To help prevent nausea and vomiting after your treatment, we encourage you to take your nausea medication as directed.  BELOW ARE SYMPTOMS THAT SHOULD BE REPORTED IMMEDIATELY: *FEVER GREATER THAN 100.4 F (38 C) OR HIGHER *CHILLS OR SWEATING *NAUSEA AND VOMITING THAT IS NOT CONTROLLED WITH YOUR NAUSEA MEDICATION *UNUSUAL SHORTNESS OF BREATH *UNUSUAL BRUISING OR BLEEDING *URINARY PROBLEMS (pain or burning when urinating, or frequent urination) *BOWEL PROBLEMS (unusual diarrhea, constipation, pain near the anus) TENDERNESS IN MOUTH AND THROAT WITH OR WITHOUT PRESENCE OF ULCERS (sore throat, sores in mouth, or a toothache) UNUSUAL RASH, SWELLING OR PAIN  UNUSUAL VAGINAL DISCHARGE OR ITCHING   Items with * indicate a potential emergency and should be followed up as soon as possible or go to the Emergency Department if any problems should occur.  Please show the CHEMOTHERAPY ALERT CARD or IMMUNOTHERAPY ALERT CARD at  check-in to the Emergency Department and triage nurse.  Should you have questions after your visit or need to cancel or reschedule your appointment, please contact [facility]  Dept: [phone]  and follow the prompts.  Office hours are 8:00 a.m. to 4:30 p.m. Monday - Friday. Please note that voicemails left after 4:00 p.m. may not be returned until the following business day.  We are closed weekends and major holidays. You have access to a nurse at all times for urgent questions. Please call the main number to the clinic Dept: [phone] and follow the prompts.   For any non-urgent questions, you may also contact your provider using MyChart. We now offer e-Visits for anyone 16 and older to request care online for non-urgent symptoms. For details visit [email or web address].   Also download the MyChart app! Go to the app store, search "MyChart", open the app, select [facility], and log in with your MyChart username and password.  Masks are optional in the cancer centers. If you would like for your care team to wear a mask while they are taking care of you, please let them know. For doctor visits, patients may have with them one support person who is at least 73 years old. At this time, visitors are not allowed in the infusion area. Nanoparticle Albumin-Bound Paclitaxel injection What is this medication? NANOPARTICLE ALBUMIN-BOUND PACLITAXEL (Na no PAHR ti kuhl  al BYOO muhn-bound  PAK li TAX el) is a chemotherapy drug. It targets fast dividing cells, like cancer cells, and causes these cells to die. This medicine is used to treat advanced breast cancer,  lung cancer, and pancreatic cancer. This medicine may be used for other purposes; ask your health care provider or pharmacist if you have questions. COMMON BRAND NAME(S): Abraxane What should I tell my care team before I take this medication? They need to know if you have any of these conditions: kidney  disease liver disease low blood counts, like low white cell, platelet, or red cell counts lung or breathing disease, like asthma tingling of the fingers or toes, or other nerve disorder an unusual or allergic reaction to paclitaxel, albumin, other chemotherapy, other medicines, foods, dyes, or preservatives pregnant or trying to get pregnant breast-feeding How should I use this medication? This drug is given as an infusion into a vein. It is administered in a hospital or clinic by a specially trained health care professional. Talk to your pediatrician regarding the use of this medicine in children. Special care may be needed. Overdosage: If you think you have taken too much of this medicine contact a poison control center or emergency room at once. NOTE: This medicine is only for you. Do not share this medicine with others. What if I miss a dose? It is important not to miss your dose. Call your doctor or health care professional if you are unable to keep an appointment. What may interact with this medication? This medicine may interact with the following medications: antiviral medicines for hepatitis, HIV or AIDS certain antibiotics like erythromycin and clarithromycin certain medicines for fungal infections like ketoconazole and itraconazole certain medicines for seizures like carbamazepine, phenobarbital, phenytoin gemfibrozil nefazodone rifampin St. John's wort This list may not describe all possible interactions. Give your health care provider a list of all the medicines, herbs, non-prescription drugs, or dietary supplements you use. Also tell them if you smoke, drink alcohol, or use illegal drugs. Some items may interact with your medicine. What should I watch for while using this medication? Your condition will be monitored carefully while you are receiving this medicine. You will need important blood work done while you are taking this medicine. This medicine can cause serious  allergic reactions. If you experience allergic reactions like skin rash, itching or hives, swelling of the face, lips, or tongue, tell your doctor or health care professional right away. In some cases, you may be given additional medicines to help with side effects. Follow all directions for their use. This drug may make you feel generally unwell. This is not uncommon, as chemotherapy can affect healthy cells as well as cancer cells. Report any side effects. Continue your course of treatment even though you feel ill unless your doctor tells you to stop. Call your doctor or health care professional for advice if you get a fever, chills or sore throat, or other symptoms of a cold or flu. Do not treat yourself. This drug decreases your body's ability to fight infections. Try to avoid being around people who are sick. This medicine may increase your risk to bruise or bleed. Call your doctor or health care professional if you notice any unusual bleeding. Be careful brushing and flossing your teeth or using a toothpick because you may get an infection or bleed more easily. If you have any dental work done, tell your dentist you are receiving this medicine. Avoid taking products that contain aspirin, acetaminophen, ibuprofen, naproxen, or ketoprofen unless instructed by your doctor. These medicines may hide a fever. Do not become pregnant while taking this medicine or for 6 months after stopping it. Women should inform their doctor if they  wish to become pregnant or think they might be pregnant. Men should not father a child while taking this medicine or for 3 months after stopping it. There is a potential for serious side effects to an unborn child. Talk to your health care professional or pharmacist for more information. Do not breast-feed an infant while taking this medicine or for 2 weeks after stopping it. This medicine may interfere with the ability to get pregnant or to father a child. You should talk to  your doctor or health care professional if you are concerned about your fertility. What side effects may I notice from receiving this medication? Side effects that you should report to your doctor or health care professional as soon as possible: allergic reactions like skin rash, itching or hives, swelling of the face, lips, or tongue breathing problems changes in vision fast, irregular heartbeat low blood pressure mouth sores pain, tingling, numbness in the hands or feet signs of decreased platelets or bleeding - bruising, pinpoint red spots on the skin, black, tarry stools, blood in the urine signs of decreased red blood cells - unusually weak or tired, feeling faint or lightheaded, falls signs of infection - fever or chills, cough, sore throat, pain or difficulty passing urine signs and symptoms of liver injury like dark yellow or brown urine; general ill feeling or flu-like symptoms; light-colored stools; loss of appetite; nausea; right upper belly pain; unusually weak or tired; yellowing of the eyes or skin swelling of the ankles, feet, hands unusually slow heartbeat Side effects that usually do not require medical attention (report to your doctor or health care professional if they continue or are bothersome): diarrhea hair loss loss of appetite nausea, vomiting tiredness This list may not describe all possible side effects. Call your doctor for medical advice about side effects. You may report side effects to FDA at 1-800-FDA-1088. Where should I keep my medication? This drug is given in a hospital or clinic and will not be stored at home. NOTE: This sheet is a summary. It may not cover all possible information. If you have questions about this medicine, talk to your doctor, pharmacist, or health care provider.  2023 Elsevier/Gold Standard (2016-09-23 00:00:00)

## 2021-08-19 NOTE — Assessment & Plan Note (Deleted)
05/24/2021:Screening mammogram detected left UOQ mass 1.5 cm at 2 o'clock position, axilla negative, ultrasound biopsy: Grade 3 IDC ER 0%, PR 0%, HER2 equivocal, FISH pending, Ki-67 40% 06/26/21:Left Lumpectomy: Grade 3 IDC 1.5 cm, margins Neg, 0/2 LN Neg, ER 0%, PR 0%, Her 2 Neg, Ki 67: 40%  Treatment Plan: 1.adjuvant chemotherapywith Taxotere and Cytoxan every 3 weeks x4 2.Adjuvant radiation ---------------------------------------------------------------------------------------------------------------- Current Treatment:Cycle 2 TC started 07/29/2021  Chemotoxicities: 1.  Bone pain from Neulasta 2. very mild fatigue  Return to clinic in 3 week for cycle 3

## 2021-08-19 NOTE — Progress Notes (Signed)
MD would like to start Abraxane at 180 mg/m2 Q 3 weeks and may increase based on toleration.  Raul Del Ladora, [place], BCPS, BCOP 08/19/2021 1:41 PM

## 2021-08-20 ENCOUNTER — Telehealth: Payer: Self-pay | Admitting: *Deleted

## 2021-08-20 NOTE — Telephone Encounter (Signed)
-----   Message from Rafael Bihari, RN sent at 08/19/2021  4:06 PM EDT ----- Regarding: Dr Lindi Adie pt, first time Abraxane infusion. Dr Lindi Adie pt, came in for first time Abraxane 08/19/2021. Tolerated infusions well. Needs call back.

## 2021-08-21 ENCOUNTER — Inpatient Hospital Stay: Payer: Medicare Other

## 2021-08-21 ENCOUNTER — Other Ambulatory Visit: Payer: Self-pay

## 2021-08-21 ENCOUNTER — Encounter: Payer: Self-pay | Admitting: Hematology and Oncology

## 2021-08-21 VITALS — BP 153/79 | HR 95 | Temp 98.5°F | Resp 18

## 2021-08-21 DIAGNOSIS — T886XXA Anaphylactic reaction due to adverse effect of correct drug or medicament properly administered, initial encounter: Secondary | ICD-10-CM | POA: Diagnosis not present

## 2021-08-21 DIAGNOSIS — Z5189 Encounter for other specified aftercare: Secondary | ICD-10-CM | POA: Diagnosis not present

## 2021-08-21 DIAGNOSIS — C50412 Malignant neoplasm of upper-outer quadrant of left female breast: Secondary | ICD-10-CM

## 2021-08-21 DIAGNOSIS — Z171 Estrogen receptor negative status [ER-]: Secondary | ICD-10-CM | POA: Diagnosis not present

## 2021-08-21 DIAGNOSIS — Z5111 Encounter for antineoplastic chemotherapy: Secondary | ICD-10-CM | POA: Diagnosis not present

## 2021-08-21 DIAGNOSIS — T451X5A Adverse effect of antineoplastic and immunosuppressive drugs, initial encounter: Secondary | ICD-10-CM | POA: Diagnosis not present

## 2021-08-21 MED ORDER — PEGFILGRASTIM INJECTION 6 MG/0.6ML ~~LOC~~
6.0000 mg | PREFILLED_SYRINGE | Freq: Once | SUBCUTANEOUS | Status: AC
  Administered 2021-08-21: 6 mg via SUBCUTANEOUS
  Filled 2021-08-21: qty 0.6

## 2021-08-21 NOTE — Patient Instructions (Signed)

## 2021-08-21 NOTE — Addendum Note (Signed)
Addended by: Tora Kindred on: 08/21/2021 11:40 AM   Modules accepted: Orders

## 2021-08-26 ENCOUNTER — Other Ambulatory Visit: Payer: Self-pay

## 2021-09-01 NOTE — Progress Notes (Signed)
Patient Care Team: Renee Rival, FNP as PCP - General (Nurse Practitioner) Mauro Kaufmann, RN as Oncology Nurse Navigator Rockwell Germany, RN as Oncology Nurse Navigator Jovita Kussmaul, MD as Consulting Physician (General Surgery) Nicholas Lose, MD as Consulting Physician (Hematology and Oncology) Kyung Rudd, MD as Consulting Physician (Radiation Oncology)  DIAGNOSIS:  Encounter Diagnosis  Name Primary?   Malignant neoplasm of upper-outer quadrant of left breast in female, estrogen receptor negative ([place])     SUMMARY OF ONCOLOGIC HISTORY: Oncology History  Malignant neoplasm of upper-outer quadrant of left breast in female, estrogen receptor negative ([place])  05/24/2021 Initial Diagnosis   Screening mammogram detected left UOQ mass 1.5 cm at 2 o'clock position, axilla negative, ultrasound biopsy: Grade 3 IDC ER 0%, PR 0%, HER2 equivocal, FISH pending, Ki-67 40%   05/29/2021 Cancer Staging   Staging form: Breast, AJCC 8th Edition - Clinical: Stage IB (cT1c, cN0, cM0, G3, ER-, PR-, HER2: Equivocal) - Signed by Nicholas Lose, MD on 05/29/2021 Stage prefix: Initial diagnosis Histologic grading system: 3 grade system   06/10/2021 Genetic Testing   Negative hereditary cancer genetic testing: no pathogenic variants detected in [facility] CustomNext-Cancer +RNAinsight Panel.  Report date is Jun 10, 2021.   The CustomNext-Cancer+RNAinsight panel offered by [facility] includes sequencing and rearrangement analysis for the following 47 genes:  APC, ATM, AXIN2, BARD1, BMPR1A, BRCA1, BRCA2, BRIP1, CDH1, CDK4, CDKN2A, CHEK2, DICER1, EPCAM, GREM1, HOXB13, MEN1, MLH1, MSH2, MSH3, MSH6, MUTYH, NBN, NF1, NF2, NTHL1, PALB2, PMS2, POLD1, POLE, PTEN, RAD51C, RAD51D, RECQL, RET, SDHA, SDHAF2, SDHB, SDHC, SDHD, SMAD4, SMARCA4, STK11, TP53, TSC1, TSC2, and VHL.  RNA data is routinely analyzed for use in variant interpretation for all genes.   06/26/2021 Surgery   Left Lumpectomy: Grade 3 IDC 1.5 cm,  margins Neg, 0/2 LN Neg, ER 0%, PR 0%, Her 2 Neg, Ki 67: 40%   07/29/2021 -  Chemotherapy   Patient is on Treatment Plan : BREAST TC q21d       CHIEF COMPLIANT: Follow-up cycle 3 TC  INTERVAL HISTORY: Sheri Fleming is a 71 y.o female with the above mention. She presents to the clinic today for a follow-up. She states that treatment went fine. Denies fatigue vomiting nausea and neuropathy.  She is doing extremely well from a chemotherapy standpoint.  Denies any fatigue.   ALLERGIES:  is allergic to taxotere [docetaxel].  MEDICATIONS:  Current Outpatient Medications  Medication Sig Dispense Refill   acetaminophen (TYLENOL) 500 MG tablet Take 500 mg by mouth every 6 (six) hours as needed for mild pain.     alendronate (FOSAMAX) 70 MG tablet Take 70 mg by mouth once a week. Thursday  3   dexamethasone (DECADRON) 4 MG tablet Take 1 tablet (4 mg total) by mouth daily. Take 1 tablet day before chemo and 1 tablet day after chemo with food 30 tablet 1   lidocaine-prilocaine (EMLA) cream Apply to affected area once 30 g 3   lisinopril-hydrochlorothiazide (PRINZIDE,ZESTORETIC) 20-25 MG per tablet Take 1 tablet by mouth daily. 30 tablet 0   montelukast (SINGULAIR) 10 MG tablet Take 1 tablet (10 mg total) by mouth at bedtime. 30 tablet 0   ondansetron (ZOFRAN) 8 MG tablet Take 1 tablet (8 mg total) by mouth 2 (two) times daily as needed for refractory nausea / vomiting. Start on day 3 after chemo. 30 tablet 1   oxyCODONE (ROXICODONE) 5 MG immediate release tablet Take 1 tablet (5 mg total) by mouth every 6 (six)  hours as needed for severe pain. 10 tablet 0   prochlorperazine (COMPAZINE) 10 MG tablet Take 1 tablet (10 mg total) by mouth every 6 (six) hours as needed (Nausea or vomiting). 30 tablet 1   simvastatin (ZOCOR) 20 MG tablet Take by mouth.     No current facility-administered medications for this visit.    PHYSICAL EXAMINATION: ECOG PERFORMANCE STATUS: 0 - Asymptomatic  Vitals:    09/09/21 1013  BP: (!) 152/79  Pulse: (!) 105  Resp: 18  Temp: (!) 97.2 F (36.2 C)  SpO2: 100%   Filed Weights   09/09/21 1013  Weight: 161 lb 4.8 oz (73.2 kg)      LABORATORY DATA:  I have reviewed the data as listed    Latest Ref Rng & Units 09/09/2021   10:00 AM 08/19/2021   11:01 AM 08/12/2021   12:45 PM  CMP  Glucose 70 - 99 mg/dL 96  102  104   BUN 8 - 23 mg/dL _0 Creatinine 0.44 - 1.00 mg/dL 0.82  0.76  0.78   Sodium 135 - 145 mmol/L 137  137  135   Potassium 3.5 - 5.1 mmol/L 3.8  3.4  4.2   Chloride 98 - 111 mmol/L 102  103  100   CO2 22 - 32 mmol/L _1 Calcium 8.9 - 10.3 mg/dL 9.3  9.6  9.6   Total Protein 6.5 - 8.1 g/dL 7.6  7.2  7.6   Total Bilirubin 0.3 - 1.2 mg/dL 0.4  0.5  0.6   Alkaline Phos 38 - 126 U/L 41  43  60   AST 15 - 41 U/L _2 ALT 0 - 44 U/L _3 Lab Results  Component Value Date   WBC 6.7 09/09/2021   HGB 11.0 (L) 09/09/2021   HCT 31.4 (L) 09/09/2021   MCV 90.2 09/09/2021   PLT 338 09/09/2021   NEUTROABS 2.7 09/09/2021    ASSESSMENT & PLAN:  Malignant neoplasm of upper-outer quadrant of left breast in female, estrogen receptor negative ([place]) Malignant neoplasm of upper-outer quadrant of left breast in female, estrogen receptor negative ([place]) 05/24/2021:Screening mammogram detected left UOQ mass 1.5 cm at 2 o'clock position, axilla negative, ultrasound biopsy: Grade 3 IDC ER 0%, PR 0%, HER2 equivocal, FISH pending, Ki-67 40% 06/26/21: Left Lumpectomy: Grade 3 IDC 1.5 cm, margins Neg, 0/2 LN Neg, ER 0%, PR 0%, Her 2 Neg, Ki 67: 40%   Treatment Plan: 1. adjuvant chemotherapy with Taxotere and Cytoxan every 3 weeks x4 2.  Adjuvant radiation ---------------------------------------------------------------------------------------------------------------- Current Treatment: Cycle 3 TC started 07/29/2021  Chemotoxicities: 1.  Bone pain from Neulasta 2. very mild fatigue 3.  Hair loss 4.   Chemotherapy-induced anemia: Mild hemoglobin 11  Labs reviewed  Return to clinic in 3 weeks for cycle 4    No orders of the defined types were placed in this encounter.  The patient has a good understanding of the overall plan. she agrees with it. she will call with any problems that may develop before the next visit here. Total time spent: 30 mins including face to face time and time spent for planning, charting and co-ordination of care   Harriette Ohara, MD 09/09/21    I Gardiner Coins am scribing for Dr. Lindi Adie  I have reviewed the above documentation for accuracy and completeness, and I agree with the  above.

## 2021-09-06 MED FILL — Dexamethasone Sodium Phosphate Inj 100 MG/10ML: INTRAMUSCULAR | Qty: 1 | Status: AC

## 2021-09-09 ENCOUNTER — Inpatient Hospital Stay: Payer: Medicare Other | Attending: Hematology and Oncology | Admitting: Hematology and Oncology

## 2021-09-09 ENCOUNTER — Inpatient Hospital Stay: Payer: Medicare Other

## 2021-09-09 ENCOUNTER — Other Ambulatory Visit: Payer: Self-pay

## 2021-09-09 VITALS — HR 99

## 2021-09-09 DIAGNOSIS — D6481 Anemia due to antineoplastic chemotherapy: Secondary | ICD-10-CM | POA: Insufficient documentation

## 2021-09-09 DIAGNOSIS — Z5189 Encounter for other specified aftercare: Secondary | ICD-10-CM | POA: Insufficient documentation

## 2021-09-09 DIAGNOSIS — M898X9 Other specified disorders of bone, unspecified site: Secondary | ICD-10-CM | POA: Diagnosis not present

## 2021-09-09 DIAGNOSIS — Z5111 Encounter for antineoplastic chemotherapy: Secondary | ICD-10-CM | POA: Insufficient documentation

## 2021-09-09 DIAGNOSIS — Z171 Estrogen receptor negative status [ER-]: Secondary | ICD-10-CM | POA: Diagnosis not present

## 2021-09-09 DIAGNOSIS — R5383 Other fatigue: Secondary | ICD-10-CM | POA: Insufficient documentation

## 2021-09-09 DIAGNOSIS — C50412 Malignant neoplasm of upper-outer quadrant of left female breast: Secondary | ICD-10-CM | POA: Diagnosis not present

## 2021-09-09 DIAGNOSIS — Z95828 Presence of other vascular implants and grafts: Secondary | ICD-10-CM

## 2021-09-09 LAB — CBC WITH DIFFERENTIAL/PLATELET
Abs Immature Granulocytes: 0.02 10*3/uL (ref 0.00–0.07)
Basophils Absolute: 0.2 10*3/uL — ABNORMAL HIGH (ref 0.0–0.1)
Basophils Relative: 3 %
Eosinophils Absolute: 0.1 10*3/uL (ref 0.0–0.5)
Eosinophils Relative: 1 %
HCT: 31.4 % — ABNORMAL LOW (ref 36.0–46.0)
Hemoglobin: 11 g/dL — ABNORMAL LOW (ref 12.0–15.0)
Immature Granulocytes: 0 %
Lymphocytes Relative: 40 %
Lymphs Abs: 2.7 10*3/uL (ref 0.7–4.0)
MCH: 31.6 pg (ref 26.0–34.0)
MCHC: 35 g/dL (ref 30.0–36.0)
MCV: 90.2 fL (ref 80.0–100.0)
Monocytes Absolute: 1 10*3/uL (ref 0.1–1.0)
Monocytes Relative: 15 %
Neutro Abs: 2.7 10*3/uL (ref 1.7–7.7)
Neutrophils Relative %: 41 %
Platelets: 338 10*3/uL (ref 150–400)
RBC: 3.48 MIL/uL — ABNORMAL LOW (ref 3.87–5.11)
RDW: 13.4 % (ref 11.5–15.5)
WBC: 6.7 10*3/uL (ref 4.0–10.5)
nRBC: 0 % (ref 0.0–0.2)

## 2021-09-09 LAB — COMPREHENSIVE METABOLIC PANEL
ALT: 11 U/L (ref 0–44)
AST: 15 U/L (ref 15–41)
Albumin: 4.2 g/dL (ref 3.5–5.0)
Alkaline Phosphatase: 41 U/L (ref 38–126)
Anion gap: 6 (ref 5–15)
BUN: 18 mg/dL (ref 8–23)
CO2: 29 mmol/L (ref 22–32)
Calcium: 9.3 mg/dL (ref 8.9–10.3)
Chloride: 102 mmol/L (ref 98–111)
Creatinine, Ser: 0.82 mg/dL (ref 0.44–1.00)
GFR, Estimated: >60 mL/min (ref >60)
Glucose, Bld: 96 mg/dL (ref 70–99)
Potassium: 3.8 mmol/L (ref 3.5–5.1)
Sodium: 137 mmol/L (ref 135–145)
Total Bilirubin: 0.4 mg/dL (ref 0.3–1.2)
Total Protein: 7.6 g/dL (ref 6.5–8.1)

## 2021-09-09 MED ORDER — SODIUM CHLORIDE 0.9 % IV SOLN: Freq: Once | INTRAVENOUS | Status: AC

## 2021-09-09 MED ORDER — SODIUM CHLORIDE 0.9% FLUSH
10.0000 mL | INTRAVENOUS | Status: DC | PRN
  Administered 2021-09-09: 10 mL

## 2021-09-09 MED ORDER — FAMOTIDINE IN NACL 20-0.9 MG/50ML-% IV SOLN
20.0000 mg | Freq: Once | INTRAVENOUS | Status: AC
  Administered 2021-09-09: 20 mg via INTRAVENOUS
  Filled 2021-09-09: qty 50

## 2021-09-09 MED ORDER — SODIUM CHLORIDE 0.9 % IV SOLN
10.0000 mg | Freq: Once | INTRAVENOUS | Status: AC
  Administered 2021-09-09: 10 mg via INTRAVENOUS
  Filled 2021-09-09: qty 10

## 2021-09-09 MED ORDER — SODIUM CHLORIDE 0.9% FLUSH
10.0000 mL | Freq: Once | INTRAVENOUS | Status: AC
  Administered 2021-09-09: 10 mL

## 2021-09-09 MED ORDER — SODIUM CHLORIDE 0.9 % IV SOLN
600.0000 mg/m2 | Freq: Once | INTRAVENOUS | Status: AC
  Administered 2021-09-09: 1080 mg via INTRAVENOUS
  Filled 2021-09-09: qty 54

## 2021-09-09 MED ORDER — PALONOSETRON HCL INJECTION 0.25 MG/5ML
0.2500 mg | Freq: Once | INTRAVENOUS | Status: AC
  Administered 2021-09-09: 0.25 mg via INTRAVENOUS
  Filled 2021-09-09: qty 5

## 2021-09-09 MED ORDER — HEPARIN SOD (PORK) LOCK FLUSH 100 UNIT/ML IV SOLN
500.0000 [IU] | Freq: Once | INTRAVENOUS | Status: AC | PRN
  Administered 2021-09-09: 500 [IU]

## 2021-09-09 MED ORDER — DIPHENHYDRAMINE HCL 50 MG/ML IJ SOLN
50.0000 mg | Freq: Once | INTRAMUSCULAR | Status: AC
  Administered 2021-09-09: 50 mg via INTRAVENOUS
  Filled 2021-09-09: qty 1

## 2021-09-09 MED ORDER — PACLITAXEL PROTEIN-BOUND CHEMO INJECTION 100 MG
170.0000 mg/m2 | Freq: Once | INTRAVENOUS | Status: AC
  Administered 2021-09-09: 300 mg via INTRAVENOUS
  Filled 2021-09-09: qty 60

## 2021-09-09 NOTE — Assessment & Plan Note (Addendum)
Malignant neoplasm of upper-outer quadrant of left breast in female, estrogen receptor negative ([place]) 05/24/2021:Screening mammogram detected left UOQ mass 1.5 cm at 2 o'clock position, axilla negative, ultrasound biopsy: Grade 3 IDC ER 0%, PR 0%, HER2 equivocal, FISH pending, Ki-67 40% 06/26/21: Left Lumpectomy: Grade 3 IDC 1.5 cm, margins Neg, 0/2 LN Neg, ER 0%, PR 0%, Her 2 Neg, Ki 67: 40%  Treatment Plan: 1. adjuvant chemotherapywith Taxotere and Cytoxan every 3 weeks x4 2.Adjuvant radiation ---------------------------------------------------------------------------------------------------------------- Current Treatment: Cycle 3 TC started 07/29/2021  Chemotoxicities: 1.  Bone pain from Neulasta 2. very mild fatigue 3.  Hair loss 4.  Chemotherapy-induced anemia: Mild hemoglobin 11  Labs reviewed  Return to clinic in 3 weeks for cycle 4

## 2021-09-11 ENCOUNTER — Inpatient Hospital Stay: Payer: Medicare Other

## 2021-09-11 ENCOUNTER — Other Ambulatory Visit: Payer: Self-pay

## 2021-09-11 VITALS — BP 130/76 | HR 105 | Temp 98.1°F | Resp 18

## 2021-09-11 DIAGNOSIS — Z5189 Encounter for other specified aftercare: Secondary | ICD-10-CM | POA: Diagnosis not present

## 2021-09-11 DIAGNOSIS — C50412 Malignant neoplasm of upper-outer quadrant of left female breast: Secondary | ICD-10-CM

## 2021-09-11 DIAGNOSIS — Z5111 Encounter for antineoplastic chemotherapy: Secondary | ICD-10-CM | POA: Diagnosis not present

## 2021-09-11 DIAGNOSIS — Z171 Estrogen receptor negative status [ER-]: Secondary | ICD-10-CM | POA: Diagnosis not present

## 2021-09-11 DIAGNOSIS — D6481 Anemia due to antineoplastic chemotherapy: Secondary | ICD-10-CM | POA: Diagnosis not present

## 2021-09-11 DIAGNOSIS — R5383 Other fatigue: Secondary | ICD-10-CM | POA: Diagnosis not present

## 2021-09-11 DIAGNOSIS — M898X9 Other specified disorders of bone, unspecified site: Secondary | ICD-10-CM | POA: Diagnosis not present

## 2021-09-11 MED ORDER — PEGFILGRASTIM INJECTION 6 MG/0.6ML ~~LOC~~
6.0000 mg | PREFILLED_SYRINGE | Freq: Once | SUBCUTANEOUS | Status: AC
  Administered 2021-09-11: 6 mg via SUBCUTANEOUS
  Filled 2021-09-11: qty 0.6

## 2021-09-12 ENCOUNTER — Encounter: Payer: Self-pay | Admitting: Hematology and Oncology

## 2021-09-20 ENCOUNTER — Encounter: Payer: Self-pay | Admitting: Nurse Practitioner

## 2021-09-20 ENCOUNTER — Ambulatory Visit (INDEPENDENT_AMBULATORY_CARE_PROVIDER_SITE_OTHER): Payer: Medicare Other | Admitting: Nurse Practitioner

## 2021-09-20 VITALS — BP 129/80 | HR 87 | Ht 61.0 in | Wt 159.0 lb

## 2021-09-20 DIAGNOSIS — E785 Hyperlipidemia, unspecified: Secondary | ICD-10-CM | POA: Diagnosis not present

## 2021-09-20 DIAGNOSIS — I10 Essential (primary) hypertension: Secondary | ICD-10-CM | POA: Diagnosis not present

## 2021-09-20 DIAGNOSIS — Z1211 Encounter for screening for malignant neoplasm of colon: Secondary | ICD-10-CM

## 2021-09-20 DIAGNOSIS — M810 Age-related osteoporosis without current pathological fracture: Secondary | ICD-10-CM

## 2021-09-20 DIAGNOSIS — E669 Obesity, unspecified: Secondary | ICD-10-CM

## 2021-09-20 NOTE — Assessment & Plan Note (Signed)
Wt Readings from Last 3 Encounters:  09/20/21 159 lb (72.1 kg)  09/09/21 161 lb 4.8 oz (73.2 kg)  08/19/21 164 lb 12.8 oz (74.8 kg)  Patient counseled on low-carb diet, and courage to engage in regular moderate exercises at least 150 minutes weekly.

## 2021-09-20 NOTE — Progress Notes (Signed)
   Sheri Fleming     MRN: [id number]      DOB: 1950/11/23   HPI [name] with past medical history of high blood pressure, asthma, osteoporosis, hyperlipidemia, malignant neoplasm of left breast is here for follow up and re-evaluation of chronic medical conditions,   The patient denies any new concerns today.  Currently undergoing chemotherapy for her left breast cancer has 1 more chemo left afterwards will go for radiation.  Patient states that she generally feels good  Hypertension.  Currently on lisinopril-hydrochlorothiazide 20-25 mg 1 tablet daily.  Denies chest pain, dizziness, edema  Hyperlipidemia currently on simvastatin 20 mg daily.  Patient denies muscle aches    Due for shingles vaccine, Tdap vaccine, pneumonia vaccine.  Patient stated that she will take all medications when she is done with her treatment. Cologuard ordered for colon cancer screening   ROS Denies recent fever or chills. Denies sinus pressure, nasal congestion, ear pain or sore throat. Denies chest congestion, productive cough or wheezing. Denies chest pains, palpitations and leg swelling Denies abdominal pain, nausea, vomiting,diarrhea or constipation.   Denies dysuria, frequency, hesitancy or incontinence. Denies joint pain, swelling and limitation in mobility. Denies headaches, seizures, numbness, or tingling. Denies depression, anxiety or insomnia.    PE  BP 129/80 (BP Location: Right Arm, Patient Position: Sitting, Cuff Size: Normal)   Pulse 87   Ht '5\' 1"'$  (1.549 m)   Wt 159 lb (72.1 kg)   SpO2 96%   BMI 30.04 kg/m   Patient alert and oriented and in no cardiopulmonary distress.  Chest: Clear to auscultation bilaterally.  CVS: S1, S2 no murmurs, no S3.Regular rate.  ABD: Soft non tender.   Ext: No edema  MS: Adequate ROM spine, shoulders, hips and knees.  Psych: Good eye contact, normal affect. Memory intact not anxious or depressed appearing.  CNS: CN 2-12 intact, power,   normal throughout.no focal deficits noted.   Assessment & Plan  High blood pressure BP Readings from Last 3 Encounters:  09/20/21 129/80  09/11/21 130/76  09/09/21 (!) 152/79   Condition well-controlled on lisinopril-hydrochlorothiazide 20-25 mg 1 tablet daily Current medication DASH diet advised engage in regular moderate exercises at least 150 minutes weekly Follow-up in 6 months  Osteoporosis Continue Fosamax 70 mg once weekly Due for DEXA scan this year, has test done in [facility]  Hyperlipidemia On simvastatin 20 mg daily Check lipid panel

## 2021-09-20 NOTE — Assessment & Plan Note (Signed)
BP Readings from Last 3 Encounters:  09/20/21 129/80  09/11/21 130/76  09/09/21 (!) 152/79   Condition well-controlled on lisinopril-hydrochlorothiazide 20-25 mg 1 tablet daily Current medication DASH diet advised engage in regular moderate exercises at least 150 minutes weekly Follow-up in 6 months

## 2021-09-20 NOTE — Patient Instructions (Signed)

## 2021-09-20 NOTE — Assessment & Plan Note (Signed)
On simvastatin 20 mg daily Check lipid panel

## 2021-09-20 NOTE — Assessment & Plan Note (Signed)
Continue Fosamax 70 mg once weekly Due for DEXA scan this year, has test done in [place]

## 2021-09-21 ENCOUNTER — Other Ambulatory Visit: Payer: Self-pay

## 2021-09-23 ENCOUNTER — Ambulatory Visit: Payer: Medicare Other | Attending: General Surgery

## 2021-09-23 VITALS — Wt 159.1 lb

## 2021-09-23 DIAGNOSIS — Z483 Aftercare following surgery for neoplasm: Secondary | ICD-10-CM | POA: Insufficient documentation

## 2021-09-23 NOTE — Therapy (Addendum)
  OUTPATIENT PHYSICAL THERAPY SOZO SCREENING NOTE   Patient Name: Sheri Fleming MRN: [id number] DOB:[birth year], 71 y.o., female Today's Date: 09/23/2021  PCP: Renee Rival, FNP REFERRING PROVIDER: Jovita Kussmaul, MD   PT End of Session - 09/23/21 [phone]     Visit Number 2   # uchanged due to screen only   PT Start Time 0840    PT Stop Time 0845    PT Time Calculation (min) 5 min    Activity Tolerance Patient tolerated treatment well    Behavior During Therapy [facility] for tasks assessed/performed             Past Medical History:  Diagnosis Date   Arthritis    Asthma    Cancer ([place]) 05/22/2021   breast cancer   High cholesterol    Hypertension    Sleep apnea    Past Surgical History:  Procedure Laterality Date   ABDOMINAL HYSTERECTOMY     BREAST LUMPECTOMY WITH RADIOACTIVE SEED AND SENTINEL LYMPH NODE BIOPSY Left 06/26/2021   Procedure: LEFT BREAST LUMPECTOMY WITH RADIOACTIVE SEED AND SENTINEL LYMPH NODE BIOPSY;  Surgeon: Jovita Kussmaul, MD;  Location: [facility];  Service: General;  Laterality: Left;  GEN & PEC BLOCK   FRACTURE SURGERY Left    left lower leg   PORTACATH PLACEMENT N/A 06/26/2021   Procedure: PORT PLACEMENT;  Surgeon: Jovita Kussmaul, MD;  Location: [facility];  Service: General;  Laterality: N/A;   Patient Active Problem List   Diagnosis Date Noted   Obesity (BMI 30-39.9) 09/20/2021   Port-A-Cath in place 08/12/2021   Genetic testing 06/11/2021   Malignant neoplasm of upper-outer quadrant of left breast in female, estrogen receptor negative ([place]) 05/24/2021   High blood pressure 05/21/2021   Asthma 05/21/2021   Osteoporosis 05/21/2021   Hyperlipidemia 05/21/2021    REFERRING DIAG: left breast cancer at risk for lymphedema  THERAPY DIAG:  Aftercare following surgery for neoplasm  PERTINENT HISTORY: Patient was diagnosed on 05/04/2021 with left grade III invasive ductal carcinoma breast cancer. She underwent a left lumpectomy and sentinel node biopsy (2  negative nodes) on 06/26/2021.Marland Kitchen It is triple negative with a Ki67 of 40%.   PRECAUTIONS: left UE Lymphedema risk, None  SUBJECTIVE: Pt returns for her 3 month L-Dex screen.   PAIN:  Are you having pain? No  SOZO SCREENING: Patient was assessed today using the SOZO machine to determine the lymphedema index score. This was compared to her baseline score. It was determined that she is within the recommended range when compared to her baseline and no further action is needed at this time. She will continue SOZO screenings. These are done every 3 months for 2 years post operatively followed by every 6 months for 2 years, and then annually.   L-DEX FLOWSHEETS - 09/23/21 0800       L-DEX LYMPHEDEMA SCREENING   Measurement Type Unilateral    L-DEX MEASUREMENT EXTREMITY Upper Extremity    POSITION  Standing    DOMINANT SIDE Right    At Risk Side Left    BASELINE SCORE (UNILATERAL) 6.4    L-DEX SCORE (UNILATERAL) 3.9    VALUE CHANGE (UNILAT) -2.5               Otelia Limes, PTA 09/23/2021, 8:43 AM

## 2021-09-27 ENCOUNTER — Other Ambulatory Visit: Payer: Self-pay | Admitting: Family Medicine

## 2021-09-27 DIAGNOSIS — E785 Hyperlipidemia, unspecified: Secondary | ICD-10-CM | POA: Diagnosis not present

## 2021-09-27 MED FILL — Dexamethasone Sodium Phosphate Inj 100 MG/10ML: INTRAMUSCULAR | Qty: 1 | Status: AC

## 2021-09-28 LAB — LIPID PANEL
Chol/HDL Ratio: 3.3 ratio (ref 0.0–4.4)
Cholesterol, Total: 142 mg/dL (ref 100–199)
HDL: 43 mg/dL (ref >39)
LDL Chol Calc (NIH): 78 mg/dL (ref 0–99)
Triglycerides: 113 mg/dL (ref 0–149)
VLDL Cholesterol Cal: 21 mg/dL (ref 5–40)

## 2021-09-30 ENCOUNTER — Inpatient Hospital Stay: Payer: Medicare Other

## 2021-09-30 ENCOUNTER — Telehealth: Payer: Self-pay | Admitting: Hematology and Oncology

## 2021-09-30 ENCOUNTER — Inpatient Hospital Stay: Payer: Medicare Other | Admitting: Hematology and Oncology

## 2021-09-30 NOTE — Telephone Encounter (Signed)
Contacted patient to scheduled appointments. Patient is aware of appointments that are scheduled.   

## 2021-10-01 ENCOUNTER — Inpatient Hospital Stay: Payer: Medicare Other

## 2021-10-01 ENCOUNTER — Encounter: Payer: Self-pay | Admitting: *Deleted

## 2021-10-01 ENCOUNTER — Telehealth: Payer: Self-pay | Admitting: Radiation Oncology

## 2021-10-01 ENCOUNTER — Inpatient Hospital Stay (HOSPITAL_BASED_OUTPATIENT_CLINIC_OR_DEPARTMENT_OTHER): Payer: Medicare Other | Admitting: Hematology and Oncology

## 2021-10-01 ENCOUNTER — Other Ambulatory Visit: Payer: Self-pay

## 2021-10-01 VITALS — BP 143/82 | HR 86 | Temp 98.5°F | Resp 18 | Ht 61.0 in | Wt 160.8 lb

## 2021-10-01 DIAGNOSIS — R5383 Other fatigue: Secondary | ICD-10-CM | POA: Diagnosis not present

## 2021-10-01 DIAGNOSIS — M898X9 Other specified disorders of bone, unspecified site: Secondary | ICD-10-CM | POA: Diagnosis not present

## 2021-10-01 DIAGNOSIS — Z171 Estrogen receptor negative status [ER-]: Secondary | ICD-10-CM

## 2021-10-01 DIAGNOSIS — C50412 Malignant neoplasm of upper-outer quadrant of left female breast: Secondary | ICD-10-CM

## 2021-10-01 DIAGNOSIS — D6481 Anemia due to antineoplastic chemotherapy: Secondary | ICD-10-CM | POA: Diagnosis not present

## 2021-10-01 DIAGNOSIS — Z95828 Presence of other vascular implants and grafts: Secondary | ICD-10-CM

## 2021-10-01 DIAGNOSIS — Z1211 Encounter for screening for malignant neoplasm of colon: Secondary | ICD-10-CM | POA: Diagnosis not present

## 2021-10-01 DIAGNOSIS — Z5111 Encounter for antineoplastic chemotherapy: Secondary | ICD-10-CM | POA: Diagnosis not present

## 2021-10-01 DIAGNOSIS — Z5189 Encounter for other specified aftercare: Secondary | ICD-10-CM | POA: Diagnosis not present

## 2021-10-01 LAB — COMPREHENSIVE METABOLIC PANEL
ALT: 10 U/L (ref 0–44)
AST: 14 U/L — ABNORMAL LOW (ref 15–41)
Albumin: 4.3 g/dL (ref 3.5–5.0)
Alkaline Phosphatase: 39 U/L (ref 38–126)
Anion gap: 5 (ref 5–15)
BUN: 13 mg/dL (ref 8–23)
CO2: 31 mmol/L (ref 22–32)
Calcium: 9.6 mg/dL (ref 8.9–10.3)
Chloride: 103 mmol/L (ref 98–111)
Creatinine, Ser: 0.68 mg/dL (ref 0.44–1.00)
GFR, Estimated: >60 mL/min (ref >60)
Glucose, Bld: 91 mg/dL (ref 70–99)
Potassium: 3.6 mmol/L (ref 3.5–5.1)
Sodium: 139 mmol/L (ref 135–145)
Total Bilirubin: 0.5 mg/dL (ref 0.3–1.2)
Total Protein: 7.1 g/dL (ref 6.5–8.1)

## 2021-10-01 LAB — CBC WITH DIFFERENTIAL/PLATELET
Abs Immature Granulocytes: 0.01 10*3/uL (ref 0.00–0.07)
Basophils Absolute: 0.2 10*3/uL — ABNORMAL HIGH (ref 0.0–0.1)
Basophils Relative: 3 %
Eosinophils Absolute: 0.1 10*3/uL (ref 0.0–0.5)
Eosinophils Relative: 1 %
HCT: 31.3 % — ABNORMAL LOW (ref 36.0–46.0)
Hemoglobin: 11 g/dL — ABNORMAL LOW (ref 12.0–15.0)
Immature Granulocytes: 0 %
Lymphocytes Relative: 44 %
Lymphs Abs: 2.7 10*3/uL (ref 0.7–4.0)
MCH: 32 pg (ref 26.0–34.0)
MCHC: 35.1 g/dL (ref 30.0–36.0)
MCV: 91 fL (ref 80.0–100.0)
Monocytes Absolute: 0.9 10*3/uL (ref 0.1–1.0)
Monocytes Relative: 14 %
Neutro Abs: 2.3 10*3/uL (ref 1.7–7.7)
Neutrophils Relative %: 38 %
Platelets: 289 10*3/uL (ref 150–400)
RBC: 3.44 MIL/uL — ABNORMAL LOW (ref 3.87–5.11)
RDW: 13.8 % (ref 11.5–15.5)
WBC: 6.1 10*3/uL (ref 4.0–10.5)
nRBC: 0 % (ref 0.0–0.2)

## 2021-10-01 MED ORDER — SODIUM CHLORIDE 0.9 % IV SOLN
600.0000 mg/m2 | Freq: Once | INTRAVENOUS | Status: AC
  Administered 2021-10-01: 1080 mg via INTRAVENOUS
  Filled 2021-10-01: qty 54

## 2021-10-01 MED ORDER — SODIUM CHLORIDE 0.9 % IV SOLN
10.0000 mg | Freq: Once | INTRAVENOUS | Status: AC
  Administered 2021-10-01: 10 mg via INTRAVENOUS
  Filled 2021-10-01: qty 10

## 2021-10-01 MED ORDER — HEPARIN SOD (PORK) LOCK FLUSH 100 UNIT/ML IV SOLN
500.0000 [IU] | Freq: Once | INTRAVENOUS | Status: AC | PRN
  Administered 2021-10-01: 500 [IU]

## 2021-10-01 MED ORDER — PALONOSETRON HCL INJECTION 0.25 MG/5ML
0.2500 mg | Freq: Once | INTRAVENOUS | Status: AC
  Administered 2021-10-01: 0.25 mg via INTRAVENOUS
  Filled 2021-10-01: qty 5

## 2021-10-01 MED ORDER — PACLITAXEL PROTEIN-BOUND CHEMO INJECTION 100 MG
170.0000 mg/m2 | Freq: Once | INTRAVENOUS | Status: AC
  Administered 2021-10-01: 300 mg via INTRAVENOUS
  Filled 2021-10-01: qty 60

## 2021-10-01 MED ORDER — SODIUM CHLORIDE 0.9 % IV SOLN: Freq: Once | INTRAVENOUS | Status: AC

## 2021-10-01 MED ORDER — SODIUM CHLORIDE 0.9% FLUSH
10.0000 mL | Freq: Once | INTRAVENOUS | Status: AC
  Administered 2021-10-01: 10 mL

## 2021-10-01 MED ORDER — SODIUM CHLORIDE 0.9% FLUSH
10.0000 mL | INTRAVENOUS | Status: DC | PRN
  Administered 2021-10-01: 10 mL

## 2021-10-01 MED ORDER — FAMOTIDINE IN NACL 20-0.9 MG/50ML-% IV SOLN
20.0000 mg | Freq: Once | INTRAVENOUS | Status: AC
  Administered 2021-10-01: 20 mg via INTRAVENOUS
  Filled 2021-10-01: qty 50

## 2021-10-01 MED ORDER — DIPHENHYDRAMINE HCL 50 MG/ML IJ SOLN
50.0000 mg | Freq: Once | INTRAMUSCULAR | Status: AC
  Administered 2021-10-01: 50 mg via INTRAVENOUS
  Filled 2021-10-01: qty 1

## 2021-10-01 NOTE — Progress Notes (Signed)
Per Dr. Lindi Adie - okay to proceed with treatment with elevated HR of 104 and without CMP results.

## 2021-10-01 NOTE — Patient Instructions (Addendum)
[facility]   Happy Last Treatment Day! Discharge Instructions: Thank you for choosing [facility] to provide your oncology and hematology care.   If you have a lab appointment with the [facility], please go directly to the [facility] and check in at the registration area.   Wear comfortable clothing and clothing appropriate for easy access to any Portacath or PICC line.   We strive to give you quality time with your provider. You may need to reschedule your appointment if you arrive late (15 or more minutes).  Arriving late affects you and other patients whose appointments are after yours.  Also, if you miss three or more appointments without notifying the office, you may be dismissed from the clinic at the provider's discretion.      For prescription refill requests, have your pharmacy contact our office and allow 72 hours for refills to be completed.    Today you received the following chemotherapy and/or immunotherapy agents: Paclitaxel protein-bound (Abraxane) and Cyclophosphamide (Cytoxan)   To help prevent nausea and vomiting after your treatment, we encourage you to take your nausea medication as directed.  BELOW ARE SYMPTOMS THAT SHOULD BE REPORTED IMMEDIATELY: *FEVER GREATER THAN 100.4 F (38 C) OR HIGHER *CHILLS OR SWEATING *NAUSEA AND VOMITING THAT IS NOT CONTROLLED WITH YOUR NAUSEA MEDICATION *UNUSUAL SHORTNESS OF BREATH *UNUSUAL BRUISING OR BLEEDING *URINARY PROBLEMS (pain or burning when urinating, or frequent urination) *BOWEL PROBLEMS (unusual diarrhea, constipation, pain near the anus) TENDERNESS IN MOUTH AND THROAT WITH OR WITHOUT PRESENCE OF ULCERS (sore throat, sores in mouth, or a toothache) UNUSUAL RASH, SWELLING OR PAIN  UNUSUAL VAGINAL DISCHARGE OR ITCHING   Items with * indicate a potential emergency and should be followed up as soon as possible or go to the Emergency Department if any problems should  occur.  Please show the CHEMOTHERAPY ALERT CARD or IMMUNOTHERAPY ALERT CARD at check-in to the Emergency Department and triage nurse.  Should you have questions after your visit or need to cancel or reschedule your appointment, please contact [facility]  Dept: [phone]  and follow the prompts.  Office hours are 8:00 a.m. to 4:30 p.m. Monday - Friday. Please note that voicemails left after 4:00 p.m. may not be returned until the following business day.  We are closed weekends and major holidays. You have access to a nurse at all times for urgent questions. Please call the main number to the clinic Dept: [phone] and follow the prompts.   For any non-urgent questions, you may also contact your provider using MyChart. We now offer e-Visits for anyone 43 and older to request care online for non-urgent symptoms. For details visit [email or web address].   Also download the MyChart app! Go to the app store, search "MyChart", open the app, select [facility], and log in with your MyChart username and password.  Masks are optional in the cancer centers. If you would like for your care team to wear a mask while they are taking care of you, please let them know. You may have one support person who is at least 73 years old accompany you for your appointments.

## 2021-10-01 NOTE — Assessment & Plan Note (Signed)
05/24/2021:Screening mammogram detected left UOQ mass 1.5 cm at 2 o'clock position, axilla negative, ultrasound biopsy: Grade 3 IDC ER 0%, PR 0%, HER2 equivocal, FISH pending, Ki-67 40% 06/26/21:Left Lumpectomy: Grade 3 IDC 1.5 cm, margins Neg, 0/2 LN Neg, ER 0%, PR 0%, Her 2 Neg, Ki 67: 40%  Treatment Plan: 1.adjuvant chemotherapywith Taxotere and Cytoxan every 3 weeks x4 2.Adjuvant radiation ---------------------------------------------------------------------------------------------------------------- Current Treatment:Cycle 4 TC started 07/29/2021  Chemotoxicities: 1.  Bone pain from Neulasta 2. very mild fatigue 3.  Hair loss 4.  Chemotherapy-induced anemia: Mild hemoglobin 11  Labs reviewed Return to clinic after radiation

## 2021-10-01 NOTE — Telephone Encounter (Signed)
Unable to LVM to sched CON with [facility]/Dr. Lisbeth Renshaw

## 2021-10-01 NOTE — Progress Notes (Signed)
Patient Care Team: Renee Rival, FNP as PCP - General (Nurse Practitioner) Mauro Kaufmann, RN as Oncology Nurse Navigator Rockwell Germany, RN as Oncology Nurse Navigator Jovita Kussmaul, MD as Consulting Physician (General Surgery) Nicholas Lose, MD as Consulting Physician (Hematology and Oncology) Kyung Rudd, MD as Consulting Physician (Radiation Oncology)  DIAGNOSIS:  Encounter Diagnosis  Name Primary?   Malignant neoplasm of upper-outer quadrant of left breast in female, estrogen receptor negative ([place])     SUMMARY OF ONCOLOGIC HISTORY: Oncology History  Malignant neoplasm of upper-outer quadrant of left breast in female, estrogen receptor negative ([place])  05/24/2021 Initial Diagnosis   Screening mammogram detected left UOQ mass 1.5 cm at 2 o'clock position, axilla negative, ultrasound biopsy: Grade 3 IDC ER 0%, PR 0%, HER2 equivocal, FISH pending, Ki-67 40%   05/29/2021 Cancer Staging   Staging form: Breast, AJCC 8th Edition - Clinical: Stage IB (cT1c, cN0, cM0, G3, ER-, PR-, HER2: Equivocal) - Signed by Nicholas Lose, MD on 05/29/2021 Stage prefix: Initial diagnosis Histologic grading system: 3 grade system   06/10/2021 Genetic Testing   Negative hereditary cancer genetic testing: no pathogenic variants detected in [facility] CustomNext-Cancer +RNAinsight Panel.  Report date is Jun 10, 2021.   The CustomNext-Cancer+RNAinsight panel offered by [facility] includes sequencing and rearrangement analysis for the following 47 genes:  APC, ATM, AXIN2, BARD1, BMPR1A, BRCA1, BRCA2, BRIP1, CDH1, CDK4, CDKN2A, CHEK2, DICER1, EPCAM, GREM1, HOXB13, MEN1, MLH1, MSH2, MSH3, MSH6, MUTYH, NBN, NF1, NF2, NTHL1, PALB2, PMS2, POLD1, POLE, PTEN, RAD51C, RAD51D, RECQL, RET, SDHA, SDHAF2, SDHB, SDHC, SDHD, SMAD4, SMARCA4, STK11, TP53, TSC1, TSC2, and VHL.  RNA data is routinely analyzed for use in variant interpretation for all genes.   06/26/2021 Surgery   Left Lumpectomy: Grade 3 IDC 1.5 cm,  margins Neg, 0/2 LN Neg, ER 0%, PR 0%, Her 2 Neg, Ki 67: 40%   07/29/2021 -  Chemotherapy   Patient is on Treatment Plan : BREAST TC q21d       CHIEF COMPLIANT: Cycle 4 Taxotere Cytoxan  INTERVAL HISTORY: Sheri Fleming is a 71 year old above-mentioned history of left breast cancer treated with lumpectomy and is currently on adjuvant chemotherapy and today cycle 4 of Taxotere and Cytoxan.  Overall she has tolerated her treatment extremely well.  She did have fatigue and hair loss.  Did not have any nausea or vomiting.  Denies any fevers or chills.   ALLERGIES:  is allergic to taxotere [docetaxel].  MEDICATIONS:  Current Outpatient Medications  Medication Sig Dispense Refill   acetaminophen (TYLENOL) 500 MG tablet Take 500 mg by mouth every 6 (six) hours as needed for mild pain.     alendronate (FOSAMAX) 70 MG tablet Take 70 mg by mouth once a week. Thursday  3   dexamethasone (DECADRON) 4 MG tablet Take 1 tablet (4 mg total) by mouth daily. Take 1 tablet day before chemo and 1 tablet day after chemo with food 30 tablet 1   lidocaine-prilocaine (EMLA) cream Apply to affected area once 30 g 3   lisinopril-hydrochlorothiazide (PRINZIDE,ZESTORETIC) 20-25 MG per tablet Take 1 tablet by mouth daily. 30 tablet 0   montelukast (SINGULAIR) 10 MG tablet Take 1 tablet (10 mg total) by mouth at bedtime. 30 tablet 0   ondansetron (ZOFRAN) 8 MG tablet Take 1 tablet (8 mg total) by mouth 2 (two) times daily as needed for refractory nausea / vomiting. Start on day 3 after chemo. 30 tablet 1   oxyCODONE (ROXICODONE) 5 MG immediate  release tablet Take 1 tablet (5 mg total) by mouth every 6 (six) hours as needed for severe pain. 10 tablet 0   prochlorperazine (COMPAZINE) 10 MG tablet Take 1 tablet (10 mg total) by mouth every 6 (six) hours as needed (Nausea or vomiting). 30 tablet 1   simvastatin (ZOCOR) 20 MG tablet TAKE 1 TABLET(20 MG) BY MOUTH EVERY EVENING 30 tablet 2   No current facility-administered  medications for this visit.   Facility-Administered Medications Ordered in Other Visits  Medication Dose Route Frequency Provider Last Rate Last Admin   cyclophosphamide (CYTOXAN) 1,080 mg in sodium chloride 0.9 % 250 mL chemo infusion  600 mg/m2 (Treatment Plan Recorded) Intravenous Once Nicholas Lose, MD       dexamethasone (DECADRON) 10 mg in sodium chloride 0.9 % 50 mL IVPB  10 mg Intravenous Once Nicholas Lose, MD       famotidine (PEPCID) IVPB 20 mg premix  20 mg Intravenous Once Nicholas Lose, MD       heparin lock flush 100 unit/mL  500 Units Intracatheter Once PRN Nicholas Lose, MD       PACLitaxel-protein bound (ABRAXANE) chemo infusion 300 mg  170 mg/m2 (Treatment Plan Recorded) Intravenous Once Nicholas Lose, MD       sodium chloride flush (NS) 0.9 % injection 10 mL  10 mL Intracatheter PRN Nicholas Lose, MD        PHYSICAL EXAMINATION: ECOG PERFORMANCE STATUS: 1 - Symptomatic but completely ambulatory  There were no vitals filed for this visit. There were no vitals filed for this visit.    LABORATORY DATA:  I have reviewed the data as listed    Latest Ref Rng & Units 10/01/2021    2:04 PM 09/09/2021   10:00 AM 08/19/2021   11:01 AM  CMP  Glucose 70 - 99 mg/dL 91  96  102   BUN 8 - 23 mg/dL $Remove'13  18  16   'rDitAEi$ Creatinine 0.44 - 1.00 mg/dL 0.68  0.82  0.76   Sodium 135 - 145 mmol/L 139  137  137   Potassium 3.5 - 5.1 mmol/L 3.6  3.8  3.4   Chloride 98 - 111 mmol/L 103  102  103   CO2 22 - 32 mmol/L $RemoveB'31  29  28   'gYfZlHEB$ Calcium 8.9 - 10.3 mg/dL 9.6  9.3  9.6   Total Protein 6.5 - 8.1 g/dL 7.1  7.6  7.2   Total Bilirubin 0.3 - 1.2 mg/dL 0.5  0.4  0.5   Alkaline Phos 38 - 126 U/L 39  41  43   AST 15 - 41 U/L $Remo'14  15  11   'GpVyR$ ALT 0 - 44 U/L $Remo'10  11  12     'AqkPt$ Lab Results  Component Value Date   WBC 6.1 10/01/2021   HGB 11.0 (L) 10/01/2021   HCT 31.3 (L) 10/01/2021   MCV 91.0 10/01/2021   PLT 289 10/01/2021   NEUTROABS 2.3 10/01/2021    ASSESSMENT & PLAN:  Malignant neoplasm of  upper-outer quadrant of left breast in female, estrogen receptor negative ([place]) 05/24/2021:Screening mammogram detected left UOQ mass 1.5 cm at 2 o'clock position, axilla negative, ultrasound biopsy: Grade 3 IDC ER 0%, PR 0%, HER2 equivocal, FISH pending, Ki-67 40% 06/26/21: Left Lumpectomy: Grade 3 IDC 1.5 cm, margins Neg, 0/2 LN Neg, ER 0%, PR 0%, Her 2 Neg, Ki 67: 40%   Treatment Plan: 1. adjuvant chemotherapy with Taxotere and Cytoxan every 3 weeks x4 2.  Adjuvant  radiation ---------------------------------------------------------------------------------------------------------------- Current Treatment: Cycle 4 TC started 07/29/2021   Chemotoxicities: 1.  Bone pain from Neulasta 2. very mild fatigue 3.  Hair loss 4.  Chemotherapy-induced anemia: Mild hemoglobin 11.1   Labs reviewed Return to clinic after radiation   No orders of the defined types were placed in this encounter.  The patient has a good understanding of the overall plan. she agrees with it. she will call with any problems that may develop before the next visit here. Total time spent: 30 mins including face to face time and time spent for planning, charting and co-ordination of care   Harriette Ohara, MD 10/01/21

## 2021-10-02 ENCOUNTER — Telehealth: Payer: Self-pay | Admitting: Radiation Oncology

## 2021-10-02 ENCOUNTER — Inpatient Hospital Stay: Payer: Medicare Other

## 2021-10-02 NOTE — Telephone Encounter (Signed)
Spoke to pt to make appt for CON/ CT SIM. Pt confirmed date/time.

## 2021-10-03 ENCOUNTER — Inpatient Hospital Stay: Payer: Medicare Other

## 2021-10-03 ENCOUNTER — Other Ambulatory Visit: Payer: Self-pay

## 2021-10-03 ENCOUNTER — Telehealth: Payer: Self-pay | Admitting: Radiation Oncology

## 2021-10-03 VITALS — BP 128/73 | HR 94 | Temp 98.8°F | Resp 16

## 2021-10-03 DIAGNOSIS — Z5189 Encounter for other specified aftercare: Secondary | ICD-10-CM | POA: Diagnosis not present

## 2021-10-03 DIAGNOSIS — M898X9 Other specified disorders of bone, unspecified site: Secondary | ICD-10-CM | POA: Diagnosis not present

## 2021-10-03 DIAGNOSIS — D6481 Anemia due to antineoplastic chemotherapy: Secondary | ICD-10-CM | POA: Diagnosis not present

## 2021-10-03 DIAGNOSIS — R5383 Other fatigue: Secondary | ICD-10-CM | POA: Diagnosis not present

## 2021-10-03 DIAGNOSIS — Z5111 Encounter for antineoplastic chemotherapy: Secondary | ICD-10-CM | POA: Diagnosis not present

## 2021-10-03 DIAGNOSIS — C50412 Malignant neoplasm of upper-outer quadrant of left female breast: Secondary | ICD-10-CM | POA: Diagnosis not present

## 2021-10-03 DIAGNOSIS — Z171 Estrogen receptor negative status [ER-]: Secondary | ICD-10-CM | POA: Diagnosis not present

## 2021-10-03 MED ORDER — PEGFILGRASTIM INJECTION 6 MG/0.6ML ~~LOC~~
6.0000 mg | PREFILLED_SYRINGE | Freq: Once | SUBCUTANEOUS | Status: AC
  Administered 2021-10-03: 6 mg via SUBCUTANEOUS
  Filled 2021-10-03: qty 0.6

## 2021-10-03 NOTE — Patient Instructions (Signed)

## 2021-10-03 NOTE — Telephone Encounter (Signed)
I called the patient to see if she had changed her mind and wanted to come to [facility] for her radiation. She confirmed that she did not want treatment in [place], and rather would like this in [place] with Dr. Lynnette Caffey. She has already seen him in June. She needs to have a follow up with him since completing chemotherapy but will be due to start radiation in about 4 weeks. We will cancel her simulation and appt she was given in our office next week and make sure she has follow up with Dr. Lynnette Caffey and with Dr. Lindi Adie. She is in agreement with this plan.

## 2021-10-08 ENCOUNTER — Encounter: Payer: Self-pay | Admitting: *Deleted

## 2021-10-09 ENCOUNTER — Encounter: Payer: Self-pay | Admitting: *Deleted

## 2021-10-10 ENCOUNTER — Ambulatory Visit: Payer: Medicare Other

## 2021-10-10 ENCOUNTER — Ambulatory Visit: Payer: Medicare Other | Admitting: Radiation Oncology

## 2021-10-10 DIAGNOSIS — C50412 Malignant neoplasm of upper-outer quadrant of left female breast: Secondary | ICD-10-CM

## 2021-10-10 LAB — COLOGUARD: COLOGUARD: NEGATIVE

## 2021-10-10 NOTE — Progress Notes (Signed)
Normal test

## 2021-10-11 ENCOUNTER — Other Ambulatory Visit: Payer: Self-pay | Admitting: Nurse Practitioner

## 2021-10-23 DIAGNOSIS — C50412 Malignant neoplasm of upper-outer quadrant of left female breast: Secondary | ICD-10-CM | POA: Diagnosis not present

## 2021-10-23 DIAGNOSIS — Z171 Estrogen receptor negative status [ER-]: Secondary | ICD-10-CM | POA: Diagnosis not present

## 2021-10-23 DIAGNOSIS — Z9221 Personal history of antineoplastic chemotherapy: Secondary | ICD-10-CM | POA: Diagnosis not present

## 2021-10-23 DIAGNOSIS — Z510 Encounter for antineoplastic radiation therapy: Secondary | ICD-10-CM | POA: Diagnosis not present

## 2021-10-24 DIAGNOSIS — Z510 Encounter for antineoplastic radiation therapy: Secondary | ICD-10-CM | POA: Diagnosis not present

## 2021-10-24 DIAGNOSIS — Z9221 Personal history of antineoplastic chemotherapy: Secondary | ICD-10-CM | POA: Diagnosis not present

## 2021-10-24 DIAGNOSIS — C50412 Malignant neoplasm of upper-outer quadrant of left female breast: Secondary | ICD-10-CM | POA: Diagnosis not present

## 2021-10-24 DIAGNOSIS — Z171 Estrogen receptor negative status [ER-]: Secondary | ICD-10-CM | POA: Diagnosis not present

## 2021-10-25 ENCOUNTER — Telehealth: Payer: Self-pay | Admitting: *Deleted

## 2021-10-25 NOTE — Telephone Encounter (Signed)
Multiple attempts to connect with Sheri Fleming.  Designated party release signed 05/21/2021 reveals phone number for daughter Sheri Fleming as [phone] and friend Sheri Fleming as [phone].  Connected currently with Sheri Fleming. Advised letter for [place] is invalid.  Records requested from 09/16/2021 to present.   [place] for Use/Disclosure is required for third party release.   Letter must be addressed to [place] not provider.   A new release should be signed for [employer].  One received is signed 07/11/2021.  No information beyond date of signature will be released.   Offered to send via e-mail.  Answered questions about office location and above information.  Tell receptionist at main office entry you spoke with [facility] and have ROI in folder to sign.   "Do not know if I can use e-mail to sign.  Must ask my daughter about using e-mail for this and how to clear voice mails.  Will call [employer].  Also, must arrange transportation to come in hopefully on Monday."    Currently no further questions or needs.  Faxed request to [facility] H.I.M[facility]  [facility] and [facility] ROIs to [facility] file folder.  This nurse will resend request upon receipt of signed authorizations.      Updated [employer] contacts in patient information.

## 2021-10-31 DIAGNOSIS — Z9221 Personal history of antineoplastic chemotherapy: Secondary | ICD-10-CM | POA: Diagnosis not present

## 2021-10-31 DIAGNOSIS — Z510 Encounter for antineoplastic radiation therapy: Secondary | ICD-10-CM | POA: Diagnosis not present

## 2021-10-31 DIAGNOSIS — Z171 Estrogen receptor negative status [ER-]: Secondary | ICD-10-CM | POA: Diagnosis not present

## 2021-10-31 DIAGNOSIS — C50412 Malignant neoplasm of upper-outer quadrant of left female breast: Secondary | ICD-10-CM | POA: Diagnosis not present

## 2021-10-31 NOTE — Telephone Encounter (Signed)
Connected with ANILA BOJARSKI [phone] (home) to inquire about signed ROI.  Noted H.I[facility] release tab reads "On Hold per [facility]".   "My ride got sick so I will come next Monday or Tuesday." Currently no further questions or needs.

## 2021-11-01 DIAGNOSIS — Z171 Estrogen receptor negative status [ER-]: Secondary | ICD-10-CM | POA: Diagnosis not present

## 2021-11-01 DIAGNOSIS — C50412 Malignant neoplasm of upper-outer quadrant of left female breast: Secondary | ICD-10-CM | POA: Diagnosis not present

## 2021-11-01 DIAGNOSIS — Z510 Encounter for antineoplastic radiation therapy: Secondary | ICD-10-CM | POA: Diagnosis not present

## 2021-11-01 DIAGNOSIS — Z9221 Personal history of antineoplastic chemotherapy: Secondary | ICD-10-CM | POA: Diagnosis not present

## 2021-11-04 DIAGNOSIS — Z510 Encounter for antineoplastic radiation therapy: Secondary | ICD-10-CM | POA: Diagnosis not present

## 2021-11-04 DIAGNOSIS — Z171 Estrogen receptor negative status [ER-]: Secondary | ICD-10-CM | POA: Diagnosis not present

## 2021-11-04 DIAGNOSIS — C50412 Malignant neoplasm of upper-outer quadrant of left female breast: Secondary | ICD-10-CM | POA: Diagnosis not present

## 2021-11-05 ENCOUNTER — Encounter: Payer: Self-pay | Admitting: *Deleted

## 2021-11-05 DIAGNOSIS — Z171 Estrogen receptor negative status [ER-]: Secondary | ICD-10-CM | POA: Diagnosis not present

## 2021-11-05 DIAGNOSIS — C50412 Malignant neoplasm of upper-outer quadrant of left female breast: Secondary | ICD-10-CM | POA: Diagnosis not present

## 2021-11-05 DIAGNOSIS — Z510 Encounter for antineoplastic radiation therapy: Secondary | ICD-10-CM | POA: Diagnosis not present

## 2021-11-06 ENCOUNTER — Telehealth: Payer: Self-pay | Admitting: Hematology and Oncology

## 2021-11-06 DIAGNOSIS — Z510 Encounter for antineoplastic radiation therapy: Secondary | ICD-10-CM | POA: Diagnosis not present

## 2021-11-06 DIAGNOSIS — Z171 Estrogen receptor negative status [ER-]: Secondary | ICD-10-CM | POA: Diagnosis not present

## 2021-11-06 DIAGNOSIS — C50412 Malignant neoplasm of upper-outer quadrant of left female breast: Secondary | ICD-10-CM | POA: Diagnosis not present

## 2021-11-06 NOTE — Telephone Encounter (Signed)
Lyndel Pleasure arrived to [facility] earlier.  Signed release today for [facility] use and disclosure and [employer].  Successfully faxed by this nurse to [facility] ([facility]) H.I[facility] for records requested from 09/15/2021 to present.

## 2021-11-06 NOTE — Telephone Encounter (Signed)
Scheduled appointment per 10/3 staff message. Unable to leave a voicemail due to mailbox being full.

## 2021-11-07 DIAGNOSIS — Z171 Estrogen receptor negative status [ER-]: Secondary | ICD-10-CM | POA: Diagnosis not present

## 2021-11-07 DIAGNOSIS — C50412 Malignant neoplasm of upper-outer quadrant of left female breast: Secondary | ICD-10-CM | POA: Diagnosis not present

## 2021-11-07 DIAGNOSIS — Z510 Encounter for antineoplastic radiation therapy: Secondary | ICD-10-CM | POA: Diagnosis not present

## 2021-11-08 DIAGNOSIS — Z510 Encounter for antineoplastic radiation therapy: Secondary | ICD-10-CM | POA: Diagnosis not present

## 2021-11-08 DIAGNOSIS — C50412 Malignant neoplasm of upper-outer quadrant of left female breast: Secondary | ICD-10-CM | POA: Diagnosis not present

## 2021-11-08 DIAGNOSIS — Z171 Estrogen receptor negative status [ER-]: Secondary | ICD-10-CM | POA: Diagnosis not present

## 2021-11-11 DIAGNOSIS — C50412 Malignant neoplasm of upper-outer quadrant of left female breast: Secondary | ICD-10-CM | POA: Diagnosis not present

## 2021-11-11 DIAGNOSIS — Z171 Estrogen receptor negative status [ER-]: Secondary | ICD-10-CM | POA: Diagnosis not present

## 2021-11-11 DIAGNOSIS — Z510 Encounter for antineoplastic radiation therapy: Secondary | ICD-10-CM | POA: Diagnosis not present

## 2021-11-12 DIAGNOSIS — C50412 Malignant neoplasm of upper-outer quadrant of left female breast: Secondary | ICD-10-CM | POA: Diagnosis not present

## 2021-11-12 DIAGNOSIS — Z171 Estrogen receptor negative status [ER-]: Secondary | ICD-10-CM | POA: Diagnosis not present

## 2021-11-12 DIAGNOSIS — Z510 Encounter for antineoplastic radiation therapy: Secondary | ICD-10-CM | POA: Diagnosis not present

## 2021-11-13 DIAGNOSIS — Z510 Encounter for antineoplastic radiation therapy: Secondary | ICD-10-CM | POA: Diagnosis not present

## 2021-11-13 DIAGNOSIS — Z171 Estrogen receptor negative status [ER-]: Secondary | ICD-10-CM | POA: Diagnosis not present

## 2021-11-13 DIAGNOSIS — C50412 Malignant neoplasm of upper-outer quadrant of left female breast: Secondary | ICD-10-CM | POA: Diagnosis not present

## 2021-11-14 DIAGNOSIS — Z510 Encounter for antineoplastic radiation therapy: Secondary | ICD-10-CM | POA: Diagnosis not present

## 2021-11-14 DIAGNOSIS — Z171 Estrogen receptor negative status [ER-]: Secondary | ICD-10-CM | POA: Diagnosis not present

## 2021-11-14 DIAGNOSIS — C50412 Malignant neoplasm of upper-outer quadrant of left female breast: Secondary | ICD-10-CM | POA: Diagnosis not present

## 2021-11-15 DIAGNOSIS — Z510 Encounter for antineoplastic radiation therapy: Secondary | ICD-10-CM | POA: Diagnosis not present

## 2021-11-15 DIAGNOSIS — C50412 Malignant neoplasm of upper-outer quadrant of left female breast: Secondary | ICD-10-CM | POA: Diagnosis not present

## 2021-11-15 DIAGNOSIS — Z171 Estrogen receptor negative status [ER-]: Secondary | ICD-10-CM | POA: Diagnosis not present

## 2021-11-18 DIAGNOSIS — C50412 Malignant neoplasm of upper-outer quadrant of left female breast: Secondary | ICD-10-CM | POA: Diagnosis not present

## 2021-11-18 DIAGNOSIS — Z510 Encounter for antineoplastic radiation therapy: Secondary | ICD-10-CM | POA: Diagnosis not present

## 2021-11-18 DIAGNOSIS — Z171 Estrogen receptor negative status [ER-]: Secondary | ICD-10-CM | POA: Diagnosis not present

## 2021-11-19 DIAGNOSIS — Z171 Estrogen receptor negative status [ER-]: Secondary | ICD-10-CM | POA: Diagnosis not present

## 2021-11-19 DIAGNOSIS — C50412 Malignant neoplasm of upper-outer quadrant of left female breast: Secondary | ICD-10-CM | POA: Diagnosis not present

## 2021-11-19 DIAGNOSIS — Z510 Encounter for antineoplastic radiation therapy: Secondary | ICD-10-CM | POA: Diagnosis not present

## 2021-11-20 DIAGNOSIS — Z171 Estrogen receptor negative status [ER-]: Secondary | ICD-10-CM | POA: Diagnosis not present

## 2021-11-20 DIAGNOSIS — C50412 Malignant neoplasm of upper-outer quadrant of left female breast: Secondary | ICD-10-CM | POA: Diagnosis not present

## 2021-11-20 DIAGNOSIS — Z510 Encounter for antineoplastic radiation therapy: Secondary | ICD-10-CM | POA: Diagnosis not present

## 2021-11-21 DIAGNOSIS — Z510 Encounter for antineoplastic radiation therapy: Secondary | ICD-10-CM | POA: Diagnosis not present

## 2021-11-21 DIAGNOSIS — C50412 Malignant neoplasm of upper-outer quadrant of left female breast: Secondary | ICD-10-CM | POA: Diagnosis not present

## 2021-11-21 DIAGNOSIS — Z171 Estrogen receptor negative status [ER-]: Secondary | ICD-10-CM | POA: Diagnosis not present

## 2021-11-22 DIAGNOSIS — Z510 Encounter for antineoplastic radiation therapy: Secondary | ICD-10-CM | POA: Diagnosis not present

## 2021-11-22 DIAGNOSIS — Z171 Estrogen receptor negative status [ER-]: Secondary | ICD-10-CM | POA: Diagnosis not present

## 2021-11-22 DIAGNOSIS — C50412 Malignant neoplasm of upper-outer quadrant of left female breast: Secondary | ICD-10-CM | POA: Diagnosis not present

## 2021-11-25 ENCOUNTER — Other Ambulatory Visit: Payer: Self-pay

## 2021-11-25 DIAGNOSIS — C50412 Malignant neoplasm of upper-outer quadrant of left female breast: Secondary | ICD-10-CM | POA: Diagnosis not present

## 2021-11-25 DIAGNOSIS — Z171 Estrogen receptor negative status [ER-]: Secondary | ICD-10-CM | POA: Diagnosis not present

## 2021-11-25 DIAGNOSIS — Z510 Encounter for antineoplastic radiation therapy: Secondary | ICD-10-CM | POA: Diagnosis not present

## 2021-11-25 MED ORDER — SIMVASTATIN 20 MG PO TABS: 20.0000 mg | ORAL_TABLET | Freq: Every evening | ORAL | 3 refills | Status: DC

## 2021-11-26 DIAGNOSIS — C50412 Malignant neoplasm of upper-outer quadrant of left female breast: Secondary | ICD-10-CM | POA: Diagnosis not present

## 2021-11-26 DIAGNOSIS — Z171 Estrogen receptor negative status [ER-]: Secondary | ICD-10-CM | POA: Diagnosis not present

## 2021-11-26 DIAGNOSIS — Z510 Encounter for antineoplastic radiation therapy: Secondary | ICD-10-CM | POA: Diagnosis not present

## 2021-11-27 DIAGNOSIS — Z510 Encounter for antineoplastic radiation therapy: Secondary | ICD-10-CM | POA: Diagnosis not present

## 2021-11-27 DIAGNOSIS — Z171 Estrogen receptor negative status [ER-]: Secondary | ICD-10-CM | POA: Diagnosis not present

## 2021-11-27 DIAGNOSIS — C50412 Malignant neoplasm of upper-outer quadrant of left female breast: Secondary | ICD-10-CM | POA: Diagnosis not present

## 2021-11-28 DIAGNOSIS — Z171 Estrogen receptor negative status [ER-]: Secondary | ICD-10-CM | POA: Diagnosis not present

## 2021-11-28 DIAGNOSIS — C50412 Malignant neoplasm of upper-outer quadrant of left female breast: Secondary | ICD-10-CM | POA: Diagnosis not present

## 2021-11-28 DIAGNOSIS — Z510 Encounter for antineoplastic radiation therapy: Secondary | ICD-10-CM | POA: Diagnosis not present

## 2021-11-29 DIAGNOSIS — Z510 Encounter for antineoplastic radiation therapy: Secondary | ICD-10-CM | POA: Diagnosis not present

## 2021-11-29 DIAGNOSIS — C50412 Malignant neoplasm of upper-outer quadrant of left female breast: Secondary | ICD-10-CM | POA: Diagnosis not present

## 2021-11-29 DIAGNOSIS — Z171 Estrogen receptor negative status [ER-]: Secondary | ICD-10-CM | POA: Diagnosis not present

## 2021-11-29 NOTE — Progress Notes (Signed)
Patient Care Team: Alvira Monday, [place] as PCP - General (Family Medicine) Mauro Kaufmann, RN as Oncology Nurse Navigator Rockwell Germany, RN as Oncology Nurse Navigator Jovita Kussmaul, MD as Consulting Physician (General Surgery) Nicholas Lose, MD as Consulting Physician (Hematology and Oncology) Kyung Rudd, MD as Consulting Physician (Radiation Oncology)  DIAGNOSIS: No diagnosis found.  SUMMARY OF ONCOLOGIC HISTORY: Oncology History  Malignant neoplasm of upper-outer quadrant of left breast in female, estrogen receptor negative ([place])  05/24/2021 Initial Diagnosis   Screening mammogram detected left UOQ mass 1.5 cm at 2 o'clock position, axilla negative, ultrasound biopsy: Grade 3 IDC ER 0%, PR 0%, HER2 equivocal, FISH pending, Ki-67 40%   05/29/2021 Cancer Staging   Staging form: Breast, AJCC 8th Edition - Clinical: Stage IB (cT1c, cN0, cM0, G3, ER-, PR-, HER2: Equivocal) - Signed by Nicholas Lose, MD on 05/29/2021 Stage prefix: Initial diagnosis Histologic grading system: 3 grade system   06/10/2021 Genetic Testing   Negative hereditary cancer genetic testing: no pathogenic variants detected in [facility] CustomNext-Cancer +RNAinsight Panel.  Report date is Jun 10, 2021.   The CustomNext-Cancer+RNAinsight panel offered by [facility] includes sequencing and rearrangement analysis for the following 47 genes:  APC, ATM, AXIN2, BARD1, BMPR1A, BRCA1, BRCA2, BRIP1, CDH1, CDK4, CDKN2A, CHEK2, DICER1, EPCAM, GREM1, HOXB13, MEN1, MLH1, MSH2, MSH3, MSH6, MUTYH, NBN, NF1, NF2, NTHL1, PALB2, PMS2, POLD1, POLE, PTEN, RAD51C, RAD51D, RECQL, RET, SDHA, SDHAF2, SDHB, SDHC, SDHD, SMAD4, SMARCA4, STK11, TP53, TSC1, TSC2, and VHL.  RNA data is routinely analyzed for use in variant interpretation for all genes.   06/26/2021 Surgery   Left Lumpectomy: Grade 3 IDC 1.5 cm, margins Neg, 0/2 LN Neg, ER 0%, PR 0%, Her 2 Neg, Ki 67: 40%   07/29/2021 -  Chemotherapy   Patient is on Treatment Plan : BREAST TC  q21d       CHIEF COMPLIANT: Follow-up left breast post op    INTERVAL HISTORY: Sheri Fleming is a 71 y.o female with the above-mentioned left breast. She presents to the clinic today for a follow-up.    ALLERGIES:  is allergic to taxotere [docetaxel].  MEDICATIONS:  Current Outpatient Medications  Medication Sig Dispense Refill   acetaminophen (TYLENOL) 500 MG tablet Take 500 mg by mouth every 6 (six) hours as needed for mild pain.     alendronate (FOSAMAX) 70 MG tablet Take 70 mg by mouth once a week. Thursday  3   dexamethasone (DECADRON) 4 MG tablet Take 1 tablet (4 mg total) by mouth daily. Take 1 tablet day before chemo and 1 tablet day after chemo with food 30 tablet 1   lidocaine-prilocaine (EMLA) cream Apply to affected area once 30 g 3   lisinopril-hydrochlorothiazide (PRINZIDE,ZESTORETIC) 20-25 MG per tablet Take 1 tablet by mouth daily. 30 tablet 0   montelukast (SINGULAIR) 10 MG tablet Take 1 tablet (10 mg total) by mouth at bedtime. 30 tablet 0   ondansetron (ZOFRAN) 8 MG tablet Take 1 tablet (8 mg total) by mouth 2 (two) times daily as needed for refractory nausea / vomiting. Start on day 3 after chemo. 30 tablet 1   oxyCODONE (ROXICODONE) 5 MG immediate release tablet Take 1 tablet (5 mg total) by mouth every 6 (six) hours as needed for severe pain. 10 tablet 0   prochlorperazine (COMPAZINE) 10 MG tablet Take 1 tablet (10 mg total) by mouth every 6 (six) hours as needed (Nausea or vomiting). 30 tablet 1   simvastatin (ZOCOR) 20 MG tablet Take  1 tablet (20 mg total) by mouth every evening. 90 tablet 3   No current facility-administered medications for this visit.    PHYSICAL EXAMINATION: ECOG PERFORMANCE STATUS: {CHL ONC ECOG PS:[id number]}  There were no vitals filed for this visit. There were no vitals filed for this visit.  BREAST:*** No palpable masses or nodules in either right or left breasts. No palpable axillary supraclavicular or infraclavicular adenopathy  no breast tenderness or nipple discharge. (exam performed in the presence of a chaperone)  LABORATORY DATA:  I have reviewed the data as listed    Latest Ref Rng & Units 10/01/2021    2:04 PM 09/09/2021   10:00 AM 08/19/2021   11:01 AM  CMP  Glucose 70 - 99 mg/dL 91  96  102   BUN 8 - 23 mg/dL _0 Creatinine 0.44 - 1.00 mg/dL 0.68  0.82  0.76   Sodium 135 - 145 mmol/L 139  137  137   Potassium 3.5 - 5.1 mmol/L 3.6  3.8  3.4   Chloride 98 - 111 mmol/L 103  102  103   CO2 22 - 32 mmol/L _1 Calcium 8.9 - 10.3 mg/dL 9.6  9.3  9.6   Total Protein 6.5 - 8.1 g/dL 7.1  7.6  7.2   Total Bilirubin 0.3 - 1.2 mg/dL 0.5  0.4  0.5   Alkaline Phos 38 - 126 U/L 39  41  43   AST 15 - 41 U/L _2 ALT 0 - 44 U/L _3 Lab Results  Component Value Date   WBC 6.1 10/01/2021   HGB 11.0 (L) 10/01/2021   HCT 31.3 (L) 10/01/2021   MCV 91.0 10/01/2021   PLT 289 10/01/2021   NEUTROABS 2.3 10/01/2021    ASSESSMENT & PLAN:  No problem-specific Assessment & Plan notes found for this encounter.    No orders of the defined types were placed in this encounter.  The patient has a good understanding of the overall plan. she agrees with it. she will call with any problems that may develop before the next visit here. Total time spent: 30 mins including face to face time and time spent for planning, charting and co-ordination of care   Suzzette Righter, [place] 11/29/21    I Gardiner Coins am scribing for Dr. Lindi Adie  ***

## 2021-12-02 ENCOUNTER — Encounter: Payer: Self-pay | Admitting: *Deleted

## 2021-12-02 DIAGNOSIS — Z171 Estrogen receptor negative status [ER-]: Secondary | ICD-10-CM | POA: Diagnosis not present

## 2021-12-02 DIAGNOSIS — Z510 Encounter for antineoplastic radiation therapy: Secondary | ICD-10-CM | POA: Diagnosis not present

## 2021-12-02 DIAGNOSIS — C50412 Malignant neoplasm of upper-outer quadrant of left female breast: Secondary | ICD-10-CM | POA: Diagnosis not present

## 2021-12-03 ENCOUNTER — Other Ambulatory Visit: Payer: Self-pay

## 2021-12-03 ENCOUNTER — Inpatient Hospital Stay: Payer: Medicare Other | Attending: Hematology and Oncology | Admitting: Hematology and Oncology

## 2021-12-03 DIAGNOSIS — C50412 Malignant neoplasm of upper-outer quadrant of left female breast: Secondary | ICD-10-CM | POA: Insufficient documentation

## 2021-12-03 DIAGNOSIS — Z171 Estrogen receptor negative status [ER-]: Secondary | ICD-10-CM | POA: Diagnosis not present

## 2021-12-03 NOTE — Assessment & Plan Note (Addendum)
05/24/2021:Screening mammogram detected left UOQ mass 1.5 cm at 2 o'clock position, axilla negative, ultrasound biopsy: Grade 3 IDC ER 0%, PR 0%, HER2 equivocal, FISH pending, Ki-67 40% 06/26/21:Left Lumpectomy: Grade 3 IDC 1.5 cm, margins Neg, 0/2 LN Neg, ER 0%, PR 0%, Her 2 Neg, Ki 67: 40%  Treatment Plan: 1.adjuvant chemotherapywith Taxotere and Cytoxan every 3 weeks x4 completed 07/29/2021 2.Adjuvant radiation completed 12/02/2021 ---------------------------------------------------------------------------------------------------------------- There is no role of further adjuvant systemic therapy at this time. Return to clinic in 3 months for survivorship care plan visit

## 2021-12-05 ENCOUNTER — Telehealth: Payer: Self-pay | Admitting: Hematology and Oncology

## 2021-12-05 DIAGNOSIS — M79672 Pain in left foot: Secondary | ICD-10-CM | POA: Diagnosis not present

## 2021-12-05 DIAGNOSIS — B351 Tinea unguium: Secondary | ICD-10-CM | POA: Diagnosis not present

## 2021-12-05 DIAGNOSIS — M79675 Pain in left toe(s): Secondary | ICD-10-CM | POA: Diagnosis not present

## 2021-12-05 DIAGNOSIS — M79671 Pain in right foot: Secondary | ICD-10-CM | POA: Diagnosis not present

## 2021-12-05 DIAGNOSIS — L609 Nail disorder, unspecified: Secondary | ICD-10-CM | POA: Diagnosis not present

## 2021-12-05 DIAGNOSIS — M79674 Pain in right toe(s): Secondary | ICD-10-CM | POA: Diagnosis not present

## 2021-12-05 NOTE — Telephone Encounter (Signed)
Scheduled appointment per 10/31 los. Unable to leave a voicemail due to mailbox being full.

## 2021-12-09 ENCOUNTER — Ambulatory Visit: Payer: Self-pay | Admitting: General Surgery

## 2021-12-10 ENCOUNTER — Other Ambulatory Visit: Payer: Self-pay | Admitting: Family Medicine

## 2021-12-12 ENCOUNTER — Other Ambulatory Visit: Payer: Self-pay

## 2021-12-12 ENCOUNTER — Encounter (HOSPITAL_BASED_OUTPATIENT_CLINIC_OR_DEPARTMENT_OTHER): Payer: Self-pay | Admitting: General Surgery

## 2021-12-17 ENCOUNTER — Ambulatory Visit (INDEPENDENT_AMBULATORY_CARE_PROVIDER_SITE_OTHER): Payer: Medicare Other

## 2021-12-17 DIAGNOSIS — Z0000 Encounter for general adult medical examination without abnormal findings: Secondary | ICD-10-CM

## 2021-12-17 NOTE — Progress Notes (Signed)
Subjective:   Sheri Fleming is a 71 y.o. female who presents for Medicare Annual (Subsequent) preventive examination. I connected with  Sheri Fleming on 12/17/21 by a audio enabled telemedicine application and verified that I am speaking with the correct person using two identifiers.  Patient Location: Home  Provider Location: Office/Clinic  I discussed the limitations of evaluation and management by telemedicine. The patient expressed understanding and agreed to proceed.  Review of Systems           Objective:    There were no vitals filed for this visit. There is no height or weight on file to calculate BMI.     12/12/2021    1:53 PM 12/03/2021    9:21 AM 09/09/2021   10:48 AM 09/09/2021   10:14 AM 08/19/2021   11:28 AM 08/12/2021    1:28 PM 07/29/2021   12:16 PM  Advanced Directives  Does Patient Have a Medical Advance Directive? No No No No No No No  Would patient like information on creating a medical advance directive? No - Patient declined No - Patient declined No - Patient declined No - Patient declined No - Patient declined No - Patient declined No - Patient declined    Current Medications (verified) Outpatient Encounter Medications as of 12/17/2021  Medication Sig   acetaminophen (TYLENOL) 500 MG tablet Take 500 mg by mouth every 6 (six) hours as needed for mild pain.   alendronate (FOSAMAX) 70 MG tablet TAKE 1 TABLET BY MOUTH 1 TIME A WEEK   lisinopril-hydrochlorothiazide (ZESTORETIC) 20-25 MG tablet TAKE 1 TABLET BY MOUTH EVERY DAY   montelukast (SINGULAIR) 10 MG tablet Take 1 tablet (10 mg total) by mouth at bedtime.   simvastatin (ZOCOR) 20 MG tablet Take 1 tablet (20 mg total) by mouth every evening.   No facility-administered encounter medications on file as of 12/17/2021.    Allergies (verified) Taxotere [docetaxel]   History: Past Medical History:  Diagnosis Date   Arthritis    Asthma    Cancer ([place]) 05/22/2021   breast cancer   High  cholesterol    Hypertension    Sleep apnea    Past Surgical History:  Procedure Laterality Date   ABDOMINAL HYSTERECTOMY     BREAST LUMPECTOMY WITH RADIOACTIVE SEED AND SENTINEL LYMPH NODE BIOPSY Left 06/26/2021   Procedure: LEFT BREAST LUMPECTOMY WITH RADIOACTIVE SEED AND SENTINEL LYMPH NODE BIOPSY;  Surgeon: Jovita Kussmaul, MD;  Location: [facility];  Service: General;  Laterality: Left;  GEN & PEC BLOCK   FRACTURE SURGERY Left    left lower leg   PORTACATH PLACEMENT N/A 06/26/2021   Procedure: PORT PLACEMENT;  Surgeon: Jovita Kussmaul, MD;  Location: [facility];  Service: General;  Laterality: N/A;   Family History  Problem Relation Age of Onset   Stroke Mother    Hypertension Mother    Lung cancer Father        d. 55s   Vaginal cancer Sister 44   Cancer Brother        unknown type; dx 48s; mets   Hypertension Son    Stroke Son    Kidney failure Son    Hypercholesterolemia Son    Stroke Other    Colon cancer Neg Hx    Breast cancer Neg Hx    Social History   Socioeconomic History   Marital status: Divorced    Spouse name: Not on file   Number of children: 4   Years of education:  Not on file   Highest education level: Not on file  Occupational History   Not on file  Tobacco Use   Smoking status: Never   Smokeless tobacco: Never  Vaping Use   Vaping Use: Never used  Substance and Sexual Activity   Alcohol use: No   Drug use: No   Sexual activity: Not Currently  Other Topics Concern   Not on file  Social History Narrative   Lives with her ex husband. Has 4 children. Retired.    Social Determinants of Health   Financial Resource Strain: Not on file  Food Insecurity: Not on file  Transportation Needs: Not on file  Physical Activity: Not on file  Stress: Not on file  Social Connections: Not on file    Tobacco Counseling Counseling given: Not Answered   Clinical Intake:                 Diabetic?no         Activities of Daily Living    06/18/2021     1:05 PM 06/18/2021    1:02 PM  In your present state of health, do you have any difficulty performing the following activities:  Hearing?  0  Vision?  0  Difficulty concentrating or making decisions?  0  Walking or climbing stairs?  0  Dressing or bathing?  0  Doing errands, shopping? 0     Patient Care Team: Alvira Monday, [place] as PCP - General (Family Medicine) Mauro Kaufmann, RN as Oncology Nurse Navigator Rockwell Germany, RN as Oncology Nurse Navigator Jovita Kussmaul, MD as Consulting Physician (General Surgery) Nicholas Lose, MD as Consulting Physician (Hematology and Oncology) Kyung Rudd, MD as Consulting Physician (Radiation Oncology)  Indicate any recent Medical Services you may have received from other than [facility] providers in the past year (date may be approximate).     Assessment:   This is a routine wellness examination for Sheri Fleming.  Hearing/Vision screen No results found.  Dietary issues and exercise activities discussed:     Goals Addressed   None    Depression Screen    09/20/2021   11:02 AM 05/21/2021   11:04 AM  PHQ 2/9 Scores  PHQ - 2 Score 0 0    Fall Risk    09/20/2021   11:02 AM 05/21/2021   11:04 AM  Fall Risk   Falls in the past year? 0 0  Number falls in past yr: 0 0  Injury with Fall? 0 0  Risk for fall due to : No Fall Risks No Fall Risks  Follow up Falls evaluation completed Falls evaluation completed    [place]:  Any stairs in or around the home? No  If so, are there any without handrails? No  Home free of loose throw rugs in walkways, pet beds, electrical cords, etc? Yes  Adequate lighting in your home to reduce risk of falls? Yes   ASSISTIVE DEVICES UTILIZED TO PREVENT FALLS:  Life alert? No  Use of a cane, walker or w/c? No  Grab bars in the bathroom? Yes  Shower chair or bench in shower? Yes  Elevated toilet seat or a handicapped toilet? No   TIMED UP AND GO:  Was the test  performed? No .  Length of time to ambulate 10 feet:  sec.     Cognitive Function:        Immunizations Immunization History  Administered Date(s) Administered   [place]  Vaccination 03/26/2020   Moderna Sars-Covid-2 Vaccination 06/07/2019, 07/15/2019   Td 06/16/1997    TDAP status: Due, Education has been provided regarding the importance of this vaccine. Advised may receive this vaccine at local pharmacy or Health Dept. Aware to provide a copy of the vaccination record if obtained from local pharmacy or Health Dept. Verbalized acceptance and understanding.  Flu Vaccine status: Due, Education has been provided regarding the importance of this vaccine. Advised may receive this vaccine at local pharmacy or Health Dept. Aware to provide a copy of the vaccination record if obtained from local pharmacy or Health Dept. Verbalized acceptance and understanding.  Pneumococcal vaccine status: Due, Education has been provided regarding the importance of this vaccine. Advised may receive this vaccine at local pharmacy or Health Dept. Aware to provide a copy of the vaccination record if obtained from local pharmacy or Health Dept. Verbalized acceptance and understanding.  Covid-19 vaccine status: Information provided on how to obtain vaccines.   Qualifies for Shingles Vaccine? Yes   Zostavax completed No   Shingrix Completed?: No.    Education has been provided regarding the importance of this vaccine. Patient has been advised to call insurance company to determine out of pocket expense if they have not yet received this vaccine. Advised may also receive vaccine at local pharmacy or Health Dept. Verbalized acceptance and understanding.  Screening Tests Health Maintenance  Topic Date Due   Hepatitis C Screening  Never done   COLONOSCOPY (Pts 45-93yr Insurance coverage will need to be confirmed)  Never done   Medicare Annual Wellness (AWV)  12/10/2018   COVID-19 Vaccine (3 -  Moderna risk series) 04/23/2020   INFLUENZA VACCINE  09/03/2021   Zoster Vaccines- Shingrix (1 of 2) 12/21/2021 (Originally 11/27/1969)   Pneumonia Vaccine 73 Years old (1 - PCV) 09/21/2022 (Originally 11/26/2013)   DEXA SCAN  09/21/2022 (Originally 11/26/2013)   TETANUS/TDAP  09/21/2022 (Originally 06/17/2007)   MAMMOGRAM  05/17/2023   HPV VACCINES  Aged Out    Health Maintenance  Health Maintenance Due  Topic Date Due   Hepatitis C Screening  Never done   COLONOSCOPY (Pts 45-470yrInsurance coverage will need to be confirmed)  Never done   Medicare Annual Wellness (AWV)  12/10/2018   COVID-19 Vaccine (3 - Moderna risk series) 04/23/2020   INFLUENZA VACCINE  09/03/2021    Colorectal cancer screening: Type of screening: Cologuard. Completed 10/01/21. Repeat every 3 years  Mammogram status: Completed 05/16/21. Repeat every year    Lung Cancer Screening: (Low Dose CT Chest recommended if Age 73-80ears, 30 pack-year currently smoking OR have quit w/in 15years.) does not qualify.   Lung Cancer Screening Referral:   Additional Screening:  Hepatitis C Screening: does qualify; Completed   Vision Screening: Recommended annual ophthalmology exams for early detection of glaucoma and other disorders of the eye. Is the patient up to date with their annual eye exam?  No  Who is the provider or what is the name of the office in which the patient attends annual eye exams?  [facility] If pt is not established with a provider, would they like to be referred to a provider to establish care? No .   Dental Screening: Recommended annual dental exams for proper oral hygiene  Community Resource Referral / Chronic Care Management: CRR required this visit?  No   CCM required this visit?  No      Plan:     I have personally reviewed and noted the following in  the patient's chart:   Medical and social history Use of alcohol, tobacco or illicit drugs  Current medications and  supplements including opioid prescriptions. Patient is not currently taking opioid prescriptions. Functional ability and status Nutritional status Physical activity Advanced directives List of other physicians Hospitalizations, surgeries, and ER visits in previous 12 months Vitals Screenings to include cognitive, depression, and falls Referrals and appointments  In addition, I have reviewed and discussed with patient certain preventive protocols, quality metrics, and best practice recommendations. A written personalized care plan for preventive services as well as general preventive health recommendations were provided to patient.     Jill Side, [place]   12/17/2021   Nurse Notes:

## 2021-12-17 NOTE — Patient Instructions (Signed)
  Sheri Fleming , Thank you for taking time to come for your Medicare Wellness Visit. I appreciate your ongoing commitment to your health goals. Please review the following plan we discussed and let me know if I can assist you in the future.   These are the goals we discussed:  Goals      Patient Stated     Patient currently does currently have any goals set for herself.        This is a list of the screening recommended for you and due dates:  Health Maintenance  Topic Date Due   Hepatitis C Screening: USPSTF Recommendation to screen - Ages 39-79 yo.  Never done   COVID-19 Vaccine (3 - Moderna risk series) 04/23/2020   Flu Shot  09/03/2021   Zoster (Shingles) Vaccine (1 of 2) 12/21/2021*   Pneumonia Vaccine (1 - PCV) 09/21/2022*   DEXA scan (bone density measurement)  09/21/2022*   Tetanus Vaccine  09/21/2022*   Medicare Annual Wellness Visit  12/18/2022   Mammogram  05/17/2023   Cologuard (Stool DNA test)  10/01/2024   HPV Vaccine  Aged Out  *Topic was postponed. The date shown is not the original due date.

## 2021-12-18 ENCOUNTER — Other Ambulatory Visit: Payer: Self-pay

## 2021-12-18 ENCOUNTER — Encounter (HOSPITAL_BASED_OUTPATIENT_CLINIC_OR_DEPARTMENT_OTHER)
Admission: RE | Admit: 2021-12-18 | Discharge: 2021-12-18 | Disposition: A | Discharge status: Home / Self Care | Payer: Medicare Other | Source: Ambulatory Visit | Attending: General Surgery | Admitting: General Surgery

## 2021-12-18 DIAGNOSIS — Z01812 Encounter for preprocedural laboratory examination: Secondary | ICD-10-CM | POA: Insufficient documentation

## 2021-12-18 LAB — BASIC METABOLIC PANEL
Anion gap: 13 (ref 5–15)
BUN: 17 mg/dL (ref 8–23)
CO2: 24 mmol/L (ref 22–32)
Calcium: 9.5 mg/dL (ref 8.9–10.3)
Chloride: 103 mmol/L (ref 98–111)
Creatinine, Ser: 0.86 mg/dL (ref 0.44–1.00)
GFR, Estimated: >60 mL/min (ref >60)
Glucose, Bld: 69 mg/dL — ABNORMAL LOW (ref 70–99)
Potassium: 4.2 mmol/L (ref 3.5–5.1)
Sodium: 140 mmol/L (ref 135–145)

## 2021-12-18 NOTE — Progress Notes (Signed)

## 2021-12-19 NOTE — Anesthesia Preprocedure Evaluation (Addendum)
Anesthesia Evaluation  Patient identified by MRN, date of birth, ID band Patient awake    Reviewed: Allergy & Precautions, NPO status , Patient's Chart, lab work & pertinent test results  Airway Mallampati: I  TM Distance: >3 FB Neck ROM: Full    Dental  (+) Poor Dentition, Dental Advisory Given,    Pulmonary asthma , sleep apnea    Pulmonary exam normal        Cardiovascular METS: hypertension, Pt. on medications Normal cardiovascular exam     Neuro/Psych negative neurological ROS  negative psych ROS   GI/Hepatic negative GI ROS, Neg liver ROS,,,  Endo/Other  negative endocrine ROS    Renal/GU negative Renal ROS  negative genitourinary   Musculoskeletal  (+) Arthritis , Osteoarthritis,    Abdominal   Peds negative pediatric ROS (+)  Hematology negative hematology ROS (+) Hb 11.6, plt 300   Anesthesia Other Findings Breast ca   Reproductive/Obstetrics negative OB ROS                             Anesthesia Physical Anesthesia Plan  ASA: 2  Anesthesia Plan: General   Post-op Pain Management: Tylenol PO (pre-op)*   Induction: Intravenous  PONV Risk Score and Plan: 3 and Ondansetron, Dexamethasone, Treatment may vary due to age or medical condition and Diphenhydramine  Airway Management Planned: LMA  Additional Equipment: None  Intra-op Plan:   Post-operative Plan: Extubation in OR  Informed Consent: I have reviewed the patients History and Physical, chart, labs and discussed the procedure including the risks, benefits and alternatives for the proposed anesthesia with the patient or authorized representative who has indicated his/her understanding and acceptance.     Dental advisory given  Plan Discussed with: Anesthesiologist and CRNA  Anesthesia Plan Comments:         Anesthesia Quick Evaluation

## 2021-12-20 ENCOUNTER — Encounter (HOSPITAL_BASED_OUTPATIENT_CLINIC_OR_DEPARTMENT_OTHER)
Admission: RE | Disposition: A | Discharge status: Home / Self Care | Payer: Self-pay | Source: Ambulatory Visit | Attending: General Surgery

## 2021-12-20 ENCOUNTER — Other Ambulatory Visit: Payer: Self-pay

## 2021-12-20 ENCOUNTER — Encounter (HOSPITAL_BASED_OUTPATIENT_CLINIC_OR_DEPARTMENT_OTHER): Payer: Self-pay | Admitting: General Surgery

## 2021-12-20 ENCOUNTER — Ambulatory Visit (HOSPITAL_BASED_OUTPATIENT_CLINIC_OR_DEPARTMENT_OTHER): Payer: Medicare Other | Admitting: Anesthesiology

## 2021-12-20 ENCOUNTER — Ambulatory Visit (HOSPITAL_BASED_OUTPATIENT_CLINIC_OR_DEPARTMENT_OTHER)
Admission: RE | Admit: 2021-12-20 | Discharge: 2021-12-20 | Disposition: A | Discharge status: Home / Self Care | Payer: Medicare Other | Source: Ambulatory Visit | Attending: General Surgery | Admitting: General Surgery

## 2021-12-20 DIAGNOSIS — Z452 Encounter for adjustment and management of vascular access device: Secondary | ICD-10-CM | POA: Insufficient documentation

## 2021-12-20 DIAGNOSIS — C50912 Malignant neoplasm of unspecified site of left female breast: Secondary | ICD-10-CM | POA: Diagnosis not present

## 2021-12-20 DIAGNOSIS — C50412 Malignant neoplasm of upper-outer quadrant of left female breast: Secondary | ICD-10-CM | POA: Diagnosis not present

## 2021-12-20 DIAGNOSIS — Z171 Estrogen receptor negative status [ER-]: Secondary | ICD-10-CM | POA: Diagnosis not present

## 2021-12-20 DIAGNOSIS — Z01818 Encounter for other preprocedural examination: Secondary | ICD-10-CM

## 2021-12-20 DIAGNOSIS — Z79899 Other long term (current) drug therapy: Secondary | ICD-10-CM

## 2021-12-20 HISTORY — PX: PORT-A-CATH REMOVAL: SHX5289

## 2021-12-20 SURGERY — REMOVAL PORT-A-CATH
Anesthesia: General | Site: Chest | Laterality: Right

## 2021-12-20 MED ORDER — EPHEDRINE 5 MG/ML INJ
INTRAVENOUS | Status: AC
  Filled 2021-12-20: qty 5

## 2021-12-20 MED ORDER — ACETAMINOPHEN 500 MG PO TABS
1000.0000 mg | ORAL_TABLET | Freq: Once | ORAL | Status: AC
  Administered 2021-12-20: 1000 mg via ORAL

## 2021-12-20 MED ORDER — CHLORHEXIDINE GLUCONATE CLOTH 2 % EX PADS: 6.0000 | MEDICATED_PAD | Freq: Once | CUTANEOUS | Status: DC

## 2021-12-20 MED ORDER — BUPIVACAINE-EPINEPHRINE (PF) 0.25% -1:200000 IJ SOLN
INTRAMUSCULAR | Status: AC
  Filled 2021-12-20: qty 90

## 2021-12-20 MED ORDER — DEXAMETHASONE SODIUM PHOSPHATE 4 MG/ML IJ SOLN
INTRAMUSCULAR | Status: DC | PRN
  Administered 2021-12-20: 5 mg via INTRAVENOUS

## 2021-12-20 MED ORDER — PROPOFOL 500 MG/50ML IV EMUL
INTRAVENOUS | Status: AC
  Filled 2021-12-20: qty 50

## 2021-12-20 MED ORDER — LIDOCAINE HCL (CARDIAC) PF 100 MG/5ML IV SOSY
PREFILLED_SYRINGE | INTRAVENOUS | Status: DC | PRN
  Administered 2021-12-20: 100 mg via INTRAVENOUS

## 2021-12-20 MED ORDER — ACETAMINOPHEN 500 MG PO TABS
ORAL_TABLET | ORAL | Status: AC
  Filled 2021-12-20: qty 2

## 2021-12-20 MED ORDER — OXYCODONE HCL 5 MG PO TABS: 5.0000 mg | ORAL_TABLET | Freq: Four times a day (QID) | ORAL | 0 refills | Status: DC | PRN

## 2021-12-20 MED ORDER — PROMETHAZINE HCL 25 MG/ML IJ SOLN: 6.2500 mg | INTRAMUSCULAR | Status: DC | PRN

## 2021-12-20 MED ORDER — ONDANSETRON HCL 4 MG/2ML IJ SOLN
INTRAMUSCULAR | Status: AC
  Filled 2021-12-20: qty 2

## 2021-12-20 MED ORDER — ONDANSETRON HCL 4 MG/2ML IJ SOLN
INTRAMUSCULAR | Status: DC | PRN
  Administered 2021-12-20: 4 mg via INTRAVENOUS

## 2021-12-20 MED ORDER — CELECOXIB 200 MG PO CAPS
200.0000 mg | ORAL_CAPSULE | Freq: Once | ORAL | Status: AC
  Administered 2021-12-20: 200 mg via ORAL

## 2021-12-20 MED ORDER — AMISULPRIDE (ANTIEMETIC) 5 MG/2ML IV SOLN: 10.0000 mg | Freq: Once | INTRAVENOUS | Status: DC | PRN

## 2021-12-20 MED ORDER — GABAPENTIN 300 MG PO CAPS
ORAL_CAPSULE | ORAL | Status: AC
  Filled 2021-12-20: qty 1

## 2021-12-20 MED ORDER — MIDAZOLAM HCL 5 MG/5ML IJ SOLN
INTRAMUSCULAR | Status: DC | PRN
  Administered 2021-12-20: 2 mg via INTRAVENOUS

## 2021-12-20 MED ORDER — LACTATED RINGERS IV SOLN: INTRAVENOUS | Status: DC

## 2021-12-20 MED ORDER — EPHEDRINE SULFATE (PRESSORS) 50 MG/ML IJ SOLN
INTRAMUSCULAR | Status: DC | PRN
  Administered 2021-12-20: 10 mg via INTRAVENOUS

## 2021-12-20 MED ORDER — FENTANYL CITRATE (PF) 100 MCG/2ML IJ SOLN
INTRAMUSCULAR | Status: AC
  Filled 2021-12-20: qty 2

## 2021-12-20 MED ORDER — CELECOXIB 200 MG PO CAPS
ORAL_CAPSULE | ORAL | Status: AC
  Filled 2021-12-20: qty 1

## 2021-12-20 MED ORDER — DEXAMETHASONE SODIUM PHOSPHATE 10 MG/ML IJ SOLN
INTRAMUSCULAR | Status: AC
  Filled 2021-12-20: qty 1

## 2021-12-20 MED ORDER — FENTANYL CITRATE (PF) 100 MCG/2ML IJ SOLN: 25.0000 ug | INTRAMUSCULAR | Status: DC | PRN

## 2021-12-20 MED ORDER — BUPIVACAINE-EPINEPHRINE 0.25% -1:200000 IJ SOLN
INTRAMUSCULAR | Status: DC | PRN
  Administered 2021-12-20: 8 mL

## 2021-12-20 MED ORDER — FENTANYL CITRATE (PF) 100 MCG/2ML IJ SOLN
INTRAMUSCULAR | Status: DC | PRN
  Administered 2021-12-20: 50 ug via INTRAVENOUS

## 2021-12-20 MED ORDER — MIDAZOLAM HCL 2 MG/2ML IJ SOLN
INTRAMUSCULAR | Status: AC
  Filled 2021-12-20: qty 2

## 2021-12-20 MED ORDER — LIDOCAINE 2% (20 MG/ML) 5 ML SYRINGE
INTRAMUSCULAR | Status: AC
  Filled 2021-12-20: qty 5

## 2021-12-20 MED ORDER — PROPOFOL 10 MG/ML IV BOLUS
INTRAVENOUS | Status: DC | PRN
  Administered 2021-12-20: 130 mg via INTRAVENOUS

## 2021-12-20 SURGICAL SUPPLY — 29 items
ADH SKN CLS APL DERMABOND .7 (GAUZE/BANDAGES/DRESSINGS) ×1
APL PRP STRL LF DISP 70% ISPRP (MISCELLANEOUS) ×1
BLADE SURG 15 STRL LF DISP TIS (BLADE) ×1 IMPLANT
BLADE SURG 15 STRL SS (BLADE) ×1
CHLORAPREP W/TINT 26 (MISCELLANEOUS) ×1 IMPLANT
COVER BACK TABLE 60X90IN (DRAPES) ×1 IMPLANT
COVER MAYO STAND STRL (DRAPES) ×1 IMPLANT
DERMABOND ADVANCED .7 DNX12 (GAUZE/BANDAGES/DRESSINGS) ×1 IMPLANT
DRAPE LAPAROTOMY 100X72 PEDS (DRAPES) ×1 IMPLANT
DRAPE UTILITY XL STRL (DRAPES) ×1 IMPLANT
ELECT COATED BLADE 2.86 ST (ELECTRODE) IMPLANT
ELECT REM PT RETURN 9FT ADLT (ELECTROSURGICAL) ×1
ELECTRODE REM PT RTRN 9FT ADLT (ELECTROSURGICAL) IMPLANT
GLOVE BIO SURGEON STRL SZ 6.5 (GLOVE) IMPLANT
GLOVE BIO SURGEON STRL SZ7.5 (GLOVE) ×1 IMPLANT
GLOVE BIOGEL PI IND STRL 7.0 (GLOVE) IMPLANT
GOWN STRL REUS W/ TWL LRG LVL3 (GOWN DISPOSABLE) ×2 IMPLANT
GOWN STRL REUS W/TWL LRG LVL3 (GOWN DISPOSABLE) ×2
NDL HYPO 25X1 1.5 SAFETY (NEEDLE) ×1 IMPLANT
NEEDLE HYPO 25X1 1.5 SAFETY (NEEDLE) ×1 IMPLANT
PACK BASIN DAY SURGERY FS (CUSTOM PROCEDURE TRAY) ×1 IMPLANT
PENCIL SMOKE EVACUATOR (MISCELLANEOUS) IMPLANT
SLEEVE SCD COMPRESS KNEE MED (STOCKING) IMPLANT
SPIKE FLUID TRANSFER (MISCELLANEOUS) ×1 IMPLANT
SUT MON AB 4-0 PC3 18 (SUTURE) ×1 IMPLANT
SUT VIC AB 3-0 SH 27 (SUTURE) ×1
SUT VIC AB 3-0 SH 27X BRD (SUTURE) ×1 IMPLANT
SYR CONTROL 10ML LL (SYRINGE) ×1 IMPLANT
TOWEL GREEN STERILE FF (TOWEL DISPOSABLE) ×1 IMPLANT

## 2021-12-20 NOTE — Anesthesia Procedure Notes (Signed)
Procedure Name: LMA Insertion Date/Time: 12/20/2021 11:07 AM  Performed by: Tawni Millers, CRNAPre-anesthesia Checklist: Patient identified, Emergency Drugs available, Suction available and Patient being monitored Patient Re-evaluated:Patient Re-evaluated prior to induction Oxygen Delivery Method: Circle system utilized Preoxygenation: Pre-oxygenation with 100% oxygen Induction Type: IV induction Ventilation: Mask ventilation without difficulty LMA: LMA inserted LMA Size: 3.0 Number of attempts: 1 Airway Equipment and Method: Bite block Placement Confirmation: positive ETCO2 Tube secured with: Tape Dental Injury: Teeth and Oropharynx as per pre-operative assessment

## 2021-12-20 NOTE — Interval H&P Note (Signed)
History and Physical Interval Note:  12/20/2021 10:36 AM  Sheri Fleming  has presented today for surgery, with the diagnosis of LEFT BREAST CANCER.  The various methods of treatment have been discussed with the patient and family. After consideration of risks, benefits and other options for treatment, the patient has consented to  Procedure(s): REMOVAL PORT-A-CATH (N/A) as a surgical intervention.  The patient's history has been reviewed, patient examined, no change in status, stable for surgery.  I have reviewed the patient's chart and labs.  Questions were answered to the patient's satisfaction.     Autumn Messing III

## 2021-12-20 NOTE — Op Note (Signed)
12/20/2021  11:22 AM  PATIENT:  Sheri Fleming  71 y.o. female  PRE-OPERATIVE DIAGNOSIS:  LEFT BREAST CANCER  POST-OPERATIVE DIAGNOSIS:  LEFT BREAST CANCER  PROCEDURE:  Procedure(s): REMOVAL PORT-A-CATH (Right)  SURGEON:  Surgeon(s) and Role:    * Jovita Kussmaul, MD - Primary  PHYSICIAN ASSISTANT:   ASSISTANTS: none   ANESTHESIA:   local and general  EBL:  5 mL   BLOOD ADMINISTERED:none  DRAINS: none   LOCAL MEDICATIONS USED:  MARCAINE     SPECIMEN:  No Specimen  DISPOSITION OF SPECIMEN:  N/A  COUNTS:  YES  TOURNIQUET:  * No tourniquets in log *  DICTATION: .Dragon Dictation  After informed consent was obtained the patient was brought to the operating room and placed in the supine position on the operating table.  After adequate induction of general anesthesia the patient's right chest was prepped with ChloraPrep, allowed to dry, and draped in usual sterile manner.  An appropriate timeout was performed.  The area around the port was infiltrated with quarter percent Marcaine.  A small incision was then made with a 15 blade knife through her previous incision.  The incision was carried through the subcutaneous tissue sharply with a 15 blade knife until the capsule surrounding the port was opened.  The 2 anchoring stitches were divided and removed.  The port was then gently pushed out of its pocket and with gentle traction was removed from the patient without difficulty.  Pressure was held for several minutes until the area was completely hemostatic.  The tubing tract site was closed with a figure-of-eight 3-0 Vicryl stitch.  The subcutaneous tissue was closed with interrupted 3-0 Vicryl stitches.  The skin was then closed with interrupted 4-0 Monocryl subcuticular stitches.  Dermabond dressings were applied.  The patient tolerated the procedure well.  At the end of the case all needle sponge and instrument counts were correct.  The patient was then awakened and taken to  recovery in stable condition.  PLAN OF CARE: Discharge to home after PACU  PATIENT DISPOSITION:  PACU - hemodynamically stable.   Delay start of Pharmacological VTE agent (>24hrs) due to surgical blood loss or risk of bleeding: not applicable

## 2021-12-20 NOTE — H&P (Signed)
  MRN: [id number] DOB: 1950/04/15 Subjective   Chief Complaint: Post Operative Visit   History of Present Illness: Sheri Fleming is a 71 y.o. female who is seen today for left breast cancer. The patient is a 71 year old black female who is about 2 weeks status post left breast lumpectomy and sentinel node biopsy for a T1CN0 left breast cancer that was ER and PR negative and HER2 positive with a Ki-67 of 40%. She also had a port placed. She tolerated the surgery well. She denies any significant breast pain.    Review of Systems: A complete review of systems was obtained from the patient. I have reviewed this information and discussed as appropriate with the patient. See HPI as well for other ROS.  ROS   Medical History: Past Medical History:  Diagnosis Date  Arthritis  Asthma, unspecified asthma severity, unspecified whether complicated, unspecified whether persistent  Hyperlipidemia  Hypertension  Sleep apnea   Patient Active Problem List  Diagnosis  Malignant neoplasm of upper-outer quadrant of left breast in female, estrogen receptor negative (CMS-HCC)   Past Surgical History:  Procedure Laterality Date  HYSTERECTOMY  JOINT REPLACEMENT    No Known Allergies  Current Outpatient Medications on File Prior to Visit  Medication Sig Dispense Refill  alendronate (FOSAMAX) 70 MG tablet  lisinopriL-hydroCHLOROthiazide (ZESTORETIC) 20-25 mg tablet  montelukast (SINGULAIR) 10 mg tablet  simvastatin (ZOCOR) 20 MG tablet Take 20 mg by mouth every evening   No current facility-administered medications on file prior to visit.   Family History  Problem Relation Age of Onset  Stroke Mother  High blood pressure (Hypertension) Mother    Social History   Tobacco Use  Smoking Status Never  Smokeless Tobacco Never    Social History   Socioeconomic History  Marital status: Married  Tobacco Use  Smoking status: Never  Smokeless tobacco: Never  Vaping Use  Vaping Use: Never  used  Substance and Sexual Activity  Alcohol use: Not Currently  Drug use: Never   Objective:   There were no vitals filed for this visit.  There is no height or weight on file to calculate BMI.  Physical Exam   Breast: The upper outer left breast incision is healing nicely with no sign of infection or seroma.  Labs, Imaging and Diagnostic Testing:  Assessment and Plan:   Diagnoses and all orders for this visit:  Malignant neoplasm of upper-outer quadrant of left breast in female, estrogen receptor negative (CMS-HCC)    The patient is about 2 weeks status post left breast lumpectomy for breast cancer. Her margins were clean and her nodes were negative. At this point she will follow-up with medical and radiation oncology for adjuvant therapy. I will plan to see her back in about 6 months.   She presents now for port removal

## 2021-12-20 NOTE — Transfer of Care (Signed)
Immediate Anesthesia Transfer of Care Note  Patient: Sheri Fleming  Procedure(s) Performed: REMOVAL PORT-A-CATH (Right: Chest)  Patient Location: PACU  Anesthesia Type:General  Level of Consciousness: drowsy  Airway & Oxygen Therapy: Patient Spontanous Breathing and Patient connected to face mask oxygen  Post-op Assessment: Report given to RN and Post -op Vital signs reviewed and stable  Post vital signs: Reviewed and stable  Last Vitals:   BP: 93/64 (74) Vitals Value Taken Time  BP    Temp    Pulse 95 12/20/21 1134  Resp 13 12/20/21 1134  SpO2 99 % 12/20/21 1134  Vitals shown include unvalidated device data.  Last Pain:  Vitals:   12/20/21 0926  TempSrc: Oral  PainSc: 0-No pain      Patients Stated Pain Goal: 0 (94/83/47 [id number])  Complications: No notable events documented.

## 2021-12-20 NOTE — Discharge Instructions (Addendum)
  Post Anesthesia Home Care Instructions  Activity: Get plenty of rest for the remainder of the day. A responsible individual must stay with you for 24 hours following the procedure.  For the next 24 hours, DO NOT: -Drive a car -Paediatric nurse -Drink alcoholic beverages -Take any medication unless instructed by your physician -Make any legal decisions or sign important papers.  Meals: Start with liquid foods such as gelatin or soup. Progress to regular foods as tolerated. Avoid greasy, spicy, heavy foods. If nausea and/or vomiting occur, drink only clear liquids until the nausea and/or vomiting subsides. Call your physician if vomiting continues.  Special Instructions/Symptoms: Your throat may feel dry or sore from the anesthesia or the breathing tube placed in your throat during surgery. If this causes discomfort, gargle with warm salt water. The discomfort should disappear within 24 hours.  If you had a scopolamine patch placed behind your ear for the management of post- operative nausea and/or vomiting:  1. The medication in the patch is effective for 72 hours, after which it should be removed.  Wrap patch in a tissue and discard in the trash. Wash hands thoroughly with soap and water. 2. You may remove the patch earlier than 72 hours if you experience unpleasant side effects which may include dry mouth, dizziness or visual disturbances. 3. Avoid touching the patch. Wash your hands with soap and water after contact with the patch.  No tylenol today until after 3:30 if needed.

## 2021-12-20 NOTE — Anesthesia Postprocedure Evaluation (Signed)
Anesthesia Post Note  Patient: Sheri Fleming  Procedure(s) Performed: REMOVAL PORT-A-CATH (Right: Chest)     Patient location during evaluation: PACU Anesthesia Type: General Level of consciousness: sedated Pain management: pain level controlled Vital Signs Assessment: post-procedure vital signs reviewed and stable Respiratory status: spontaneous breathing and respiratory function stable Cardiovascular status: stable Postop Assessment: no apparent nausea or vomiting Anesthetic complications: no  No notable events documented.  Last Vitals:  Vitals:   12/20/21 1145 12/20/21 1206  BP: 131/69 (!) 146/73  Pulse: (!) 111 99  Resp: (!) 21 14  Temp:  36.8 C  SpO2: 100% 96%    Last Pain:  Vitals:   12/20/21 1206  TempSrc: Oral  PainSc: 0-No pain                 [name]

## 2021-12-23 ENCOUNTER — Encounter (HOSPITAL_BASED_OUTPATIENT_CLINIC_OR_DEPARTMENT_OTHER): Payer: Self-pay | Admitting: General Surgery

## 2021-12-30 ENCOUNTER — Ambulatory Visit: Payer: Medicare Other | Attending: General Surgery

## 2021-12-30 VITALS — Wt 166.1 lb

## 2021-12-30 DIAGNOSIS — Z483 Aftercare following surgery for neoplasm: Secondary | ICD-10-CM | POA: Insufficient documentation

## 2021-12-30 NOTE — Therapy (Signed)
  OUTPATIENT PHYSICAL THERAPY SOZO SCREENING NOTE   Patient Name: Sheri Fleming MRN: [id number] DOB:1950/03/11, 71 y.o., female Today's Date: 12/30/2021  PCP: Alvira Monday, [place] REFERRING PROVIDER: Renee Rival, FNP   PT End of Session - 12/30/21 [phone]     Visit Number 2   # unchanged due to screen only   PT Start Time 0830    PT Stop Time 7035    PT Time Calculation (min) 4 min    Activity Tolerance Patient tolerated treatment well    Behavior During Therapy [facility] for tasks assessed/performed             Past Medical History:  Diagnosis Date   Arthritis    Asthma    Cancer ([place]) 05/22/2021   breast cancer   High cholesterol    Hypertension    Sleep apnea    Past Surgical History:  Procedure Laterality Date   ABDOMINAL HYSTERECTOMY     BREAST LUMPECTOMY WITH RADIOACTIVE SEED AND SENTINEL LYMPH NODE BIOPSY Left 06/26/2021   Procedure: LEFT BREAST LUMPECTOMY WITH RADIOACTIVE SEED AND SENTINEL LYMPH NODE BIOPSY;  Surgeon: Jovita Kussmaul, MD;  Location: [place];  Service: General;  Laterality: Left;  GEN & PEC BLOCK   FRACTURE SURGERY Left    left lower leg   PORT-A-CATH REMOVAL Right 12/20/2021   Procedure: REMOVAL PORT-A-CATH;  Surgeon: Jovita Kussmaul, MD;  Location: [place];  Service: General;  Laterality: Right;   PORTACATH PLACEMENT N/A 06/26/2021   Procedure: PORT PLACEMENT;  Surgeon: Jovita Kussmaul, MD;  Location: [place];  Service: General;  Laterality: N/A;   Patient Active Problem List   Diagnosis Date Noted   Obesity (BMI 30-39.9) 09/20/2021   Port-A-Cath in place 08/12/2021   Genetic testing 06/11/2021   Malignant neoplasm of upper-outer quadrant of left breast in female, estrogen receptor negative ([place]) 05/24/2021   High blood pressure 05/21/2021   Asthma 05/21/2021   Osteoporosis 05/21/2021   Hyperlipidemia 05/21/2021    REFERRING DIAG: left breast cancer at risk for lymphedema  THERAPY DIAG: Aftercare following surgery for  neoplasm  PERTINENT HISTORY: Patient was diagnosed on 05/04/2021 with left grade III invasive ductal carcinoma breast cancer. She underwent a left lumpectomy and sentinel node biopsy (2 negative nodes) on 06/26/2021.Marland Kitchen It is triple negative with a Ki67 of 40%.   PRECAUTIONS: left UE Lymphedema risk, None  SUBJECTIVE: Pt returns for her 3 month L-Dex screen.   PAIN:  Are you having pain? No  SOZO SCREENING: Patient was assessed today using the SOZO machine to determine the lymphedema index score. This was compared to her baseline score. It was determined that she is within the recommended range when compared to her baseline and no further action is needed at this time. She will continue SOZO screenings. These are done every 3 months for 2 years post operatively followed by every 6 months for 2 years, and then annually.   L-DEX FLOWSHEETS - 12/30/21 0800       L-DEX LYMPHEDEMA SCREENING   Measurement Type Unilateral    L-DEX MEASUREMENT EXTREMITY Upper Extremity    POSITION  Standing    DOMINANT SIDE Right    At Risk Side Left    BASELINE SCORE (UNILATERAL) 6.4    L-DEX SCORE (UNILATERAL) 3.7    VALUE CHANGE (UNILAT) -2.7               Otelia Limes, PTA 12/30/2021, 8:33 AM

## 2022-01-10 DIAGNOSIS — Z171 Estrogen receptor negative status [ER-]: Secondary | ICD-10-CM | POA: Diagnosis not present

## 2022-01-10 DIAGNOSIS — C50412 Malignant neoplasm of upper-outer quadrant of left female breast: Secondary | ICD-10-CM | POA: Diagnosis not present

## 2022-01-21 ENCOUNTER — Other Ambulatory Visit: Payer: Self-pay | Admitting: Pharmacist

## 2022-01-28 ENCOUNTER — Other Ambulatory Visit: Payer: Self-pay | Admitting: Family Medicine

## 2022-02-05 ENCOUNTER — Other Ambulatory Visit: Payer: Self-pay

## 2022-02-05 ENCOUNTER — Telehealth: Payer: Self-pay | Admitting: Family Medicine

## 2022-02-05 DIAGNOSIS — E785 Hyperlipidemia, unspecified: Secondary | ICD-10-CM

## 2022-02-05 MED ORDER — SIMVASTATIN 20 MG PO TABS: 20.0000 mg | ORAL_TABLET | Freq: Every evening | ORAL | 3 refills | Status: DC

## 2022-02-05 NOTE — Telephone Encounter (Signed)
Prescription Request  02/05/2022  Is this a "Controlled Substance" medicine? No  LOV: Visit date not found  What is the name of the medication or equipment?  simvastatin (ZOCOR) 20 MG tablet [[id number]]    montelukast (SINGULAIR) 10 MG tablet [[id number]]   Have you contacted your pharmacy to request a refill? Yes   Which pharmacy would you like this sent to?  [facility] [address] [place]    Patient notified that their request is being sent to the clinical staff for review and that they should receive a response within 2 business days.   Please advise at Mobile [phone] (mobile)

## 2022-02-07 ENCOUNTER — Telehealth: Payer: Self-pay | Admitting: Family Medicine

## 2022-02-07 ENCOUNTER — Other Ambulatory Visit: Payer: Self-pay

## 2022-02-07 DIAGNOSIS — J4520 Mild intermittent asthma, uncomplicated: Secondary | ICD-10-CM

## 2022-02-07 MED ORDER — MONTELUKAST SODIUM 10 MG PO TABS: 10.0000 mg | ORAL_TABLET | Freq: Every day | ORAL | 3 refills | Status: DC

## 2022-02-07 NOTE — Telephone Encounter (Signed)
Please asked the patient to schedule a tele or an office visit for Singulair refill

## 2022-02-07 NOTE — Telephone Encounter (Signed)
Spoke with pt, refills been sent

## 2022-02-07 NOTE — Telephone Encounter (Signed)
Patient call back in regard to  montelukast (SINGULAIR) 10 MG tablet  Hasn't heard anything back in regard. Patient wants a call back.

## 2022-02-07 NOTE — Telephone Encounter (Signed)
Called patient to let her know she needs an appt to have singulair refilled. Unable to leave VM.

## 2022-02-07 NOTE — Telephone Encounter (Signed)
Called patient said nurse told patient that it would be sent into her pharmacy.

## 2022-02-12 DIAGNOSIS — Z171 Estrogen receptor negative status [ER-]: Secondary | ICD-10-CM | POA: Diagnosis not present

## 2022-02-12 DIAGNOSIS — C50412 Malignant neoplasm of upper-outer quadrant of left female breast: Secondary | ICD-10-CM | POA: Diagnosis not present

## 2022-02-17 ENCOUNTER — Other Ambulatory Visit: Payer: Self-pay

## 2022-02-17 DIAGNOSIS — J4520 Mild intermittent asthma, uncomplicated: Secondary | ICD-10-CM

## 2022-02-17 MED ORDER — MONTELUKAST SODIUM 10 MG PO TABS: 10.0000 mg | ORAL_TABLET | Freq: Every day | ORAL | 3 refills | Status: DC

## 2022-02-26 ENCOUNTER — Encounter: Payer: Self-pay | Admitting: Hematology and Oncology

## 2022-03-05 ENCOUNTER — Telehealth: Payer: Self-pay

## 2022-03-05 ENCOUNTER — Encounter: Payer: Self-pay | Admitting: Adult Health

## 2022-03-05 ENCOUNTER — Inpatient Hospital Stay: Payer: 59 | Attending: Hematology and Oncology | Admitting: Adult Health

## 2022-03-05 ENCOUNTER — Other Ambulatory Visit: Payer: Self-pay

## 2022-03-05 VITALS — BP 143/65 | HR 89 | Temp 97.3°F | Resp 17 | Wt 170.1 lb

## 2022-03-05 DIAGNOSIS — Z171 Estrogen receptor negative status [ER-]: Secondary | ICD-10-CM | POA: Insufficient documentation

## 2022-03-05 DIAGNOSIS — Z801 Family history of malignant neoplasm of trachea, bronchus and lung: Secondary | ICD-10-CM | POA: Insufficient documentation

## 2022-03-05 DIAGNOSIS — Z90710 Acquired absence of both cervix and uterus: Secondary | ICD-10-CM | POA: Insufficient documentation

## 2022-03-05 DIAGNOSIS — C50412 Malignant neoplasm of upper-outer quadrant of left female breast: Secondary | ICD-10-CM | POA: Diagnosis not present

## 2022-03-05 DIAGNOSIS — I10 Essential (primary) hypertension: Secondary | ICD-10-CM | POA: Diagnosis not present

## 2022-03-05 NOTE — Telephone Encounter (Signed)
Called [facility] pharmacy in [place] at [phone] and requested vaccine history faxed to Wilber Bihari, NP. The only vaccine she had at [facility] is flu vaccine 01/22/22.

## 2022-03-05 NOTE — Progress Notes (Signed)
SURVIVORSHIP VISIT:   BRIEF ONCOLOGIC HISTORY:  Oncology History  Malignant neoplasm of upper-outer quadrant of left breast in female, estrogen receptor negative ([place])  05/24/2021 Initial Diagnosis   Screening mammogram detected left UOQ mass 1.5 cm at 2 o'clock position, axilla negative, ultrasound biopsy: Grade 3 IDC ER 0%, PR 0%, HER2 equivocal, FISH pending, Ki-67 40%   05/29/2021 Cancer Staging   Staging form: Breast, AJCC 8th Edition - Clinical: Stage IB (cT1c, cN0, cM0, G3, ER-, PR-, HER2: Equivocal) - Signed by Nicholas Lose, MD on 05/29/2021 Stage prefix: Initial diagnosis Histologic grading system: 3 grade system   06/10/2021 Genetic Testing   Negative hereditary cancer genetic testing: no pathogenic variants detected in [facility] CustomNext-Cancer +RNAinsight Panel.  Report date is Jun 10, 2021.   The CustomNext-Cancer+RNAinsight panel offered by [facility] includes sequencing and rearrangement analysis for the following 47 genes:  APC, ATM, AXIN2, BARD1, BMPR1A, BRCA1, BRCA2, BRIP1, CDH1, CDK4, CDKN2A, CHEK2, DICER1, EPCAM, GREM1, HOXB13, MEN1, MLH1, MSH2, MSH3, MSH6, MUTYH, NBN, NF1, NF2, NTHL1, PALB2, PMS2, POLD1, POLE, PTEN, RAD51C, RAD51D, RECQL, RET, SDHA, SDHAF2, SDHB, SDHC, SDHD, SMAD4, SMARCA4, STK11, TP53, TSC1, TSC2, and VHL.  RNA data is routinely analyzed for use in variant interpretation for all genes.   06/26/2021 Surgery   Left Lumpectomy: Grade 3 IDC 1.5 cm, margins Neg, 0/2 LN Neg, ER 0%, PR 0%, Her 2 Neg, Ki 67: 40%   07/29/2021 - 10/03/2021 Chemotherapy   Patient is on Treatment Plan : BREAST TC q21d     11/05/2021 - 12/02/2021 Radiation Therapy   Adj XRT (Dr.Morrison in [place]) - Left breast  4272 cGy in 267 cGy daily fractions "Boost": 1000 cGy in 250 cGy daily fractions     INTERVAL HISTORY:  Sheri Fleming to review her survivorship care plan detailing her treatment course for breast cancer, as well as monitoring long-term side effects of that treatment, education  regarding health maintenance, screening, and overall wellness and health promotion.     Overall, Sheri Fleming reports feeling quite well.  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  She denies any discomfort or difficulty in performing her job duties.    REVIEW OF SYSTEMS:  Review of Systems  Constitutional:  Negative for appetite change, chills, fatigue, fever and unexpected weight change.  HENT:   Negative for hearing loss, lump/mass and trouble swallowing.   Eyes:  Negative for eye problems and icterus.  Respiratory:  Negative for chest tightness, cough and shortness of breath.   Cardiovascular:  Negative for chest pain, leg swelling and palpitations.  Gastrointestinal:  Negative for abdominal distention, abdominal pain, constipation, diarrhea, nausea and vomiting.  Endocrine: Negative for hot flashes.  Genitourinary:  Negative for difficulty urinating.   Musculoskeletal:  Negative for arthralgias.  Skin:  Negative for itching and rash.  Neurological:  Negative for dizziness, extremity weakness, headaches and numbness.  Hematological:  Negative for adenopathy. Does not bruise/bleed easily.  Psychiatric/Behavioral:  Negative for depression. The patient is not nervous/anxious.   Breast: Denies any new nodularity, masses, tenderness, nipple changes, or nipple discharge.     PAST MEDICAL/SURGICAL HISTORY:  Past Medical History:  Diagnosis Date   Arthritis    Asthma    Cancer ([place]) 05/22/2021   breast cancer   High cholesterol    Hypertension    Sleep apnea    Past Surgical History:  Procedure Laterality Date   ABDOMINAL HYSTERECTOMY     BREAST LUMPECTOMY WITH RADIOACTIVE SEED AND SENTINEL LYMPH  NODE BIOPSY Left 06/26/2021   Procedure: LEFT BREAST LUMPECTOMY WITH RADIOACTIVE SEED AND SENTINEL LYMPH NODE BIOPSY;  Surgeon: Jovita Kussmaul, MD;  Location: [place];  Service: General;  Laterality: Left;  GEN & PEC BLOCK   FRACTURE SURGERY Left    left lower leg   PORT-A-CATH  REMOVAL Right 12/20/2021   Procedure: REMOVAL PORT-A-CATH;  Surgeon: Jovita Kussmaul, MD;  Location: [place];  Service: General;  Laterality: Right;   PORTACATH PLACEMENT N/A 06/26/2021   Procedure: PORT PLACEMENT;  Surgeon: Jovita Kussmaul, MD;  Location: [place];  Service: General;  Laterality: N/A;     ALLERGIES:  Allergies  Allergen Reactions   Taxotere [Docetaxel] Other (See Comments)    Pt had hypersensitivity rxn to Docetaxel. See progress note from 07/29/21 at 1419. Infusion discontinued.      CURRENT MEDICATIONS:  Outpatient Encounter Medications as of 03/05/2022  Medication Sig   acetaminophen (TYLENOL) 500 MG tablet Take 500 mg by mouth every 6 (six) hours as needed for mild pain.   alendronate (FOSAMAX) 70 MG tablet TAKE 1 TABLET BY MOUTH 1 TIME A WEEK   lisinopril-hydrochlorothiazide (ZESTORETIC) 20-25 MG tablet TAKE 1 TABLET BY MOUTH EVERY DAY   montelukast (SINGULAIR) 10 MG tablet Take 1 tablet (10 mg total) by mouth at bedtime.   oxyCODONE (ROXICODONE) 5 MG immediate release tablet Take 1 tablet (5 mg total) by mouth every 6 (six) hours as needed for severe pain.   simvastatin (ZOCOR) 20 MG tablet Take 1 tablet (20 mg total) by mouth every evening.   No facility-administered encounter medications on file as of 03/05/2022.     ONCOLOGIC FAMILY HISTORY:  Family History  Problem Relation Age of Onset   Stroke Mother    Hypertension Mother    Lung cancer Father        d. 70s   Vaginal cancer Sister 61   Cancer Brother        unknown type; dx 51s; mets   Hypertension Son    Stroke Son    Kidney failure Son    Hypercholesterolemia Son    Stroke Other    Colon cancer Neg Hx    Breast cancer Neg Hx      SOCIAL HISTORY:  Social History   Socioeconomic History   Marital status: Divorced    Spouse name: Not on file   Number of children: 4   Years of education: Not on file   Highest education level: Not on file  Occupational History   Not on  file  Tobacco Use   Smoking status: Never   Smokeless tobacco: Never  Vaping Use   Vaping Use: Never used  Substance and Sexual Activity   Alcohol use: No   Drug use: No   Sexual activity: Not Currently  Other Topics Concern   Not on file  Social History Narrative   Lives with her ex husband. Has 4 children. Retired.    Social Determinants of Health   Financial Resource Strain: Low Risk  (12/17/2021)   Overall Financial Resource Strain (CARDIA)    Difficulty of Paying Living Expenses: Not hard at all  Food Insecurity: No Food Insecurity (12/17/2021)   Hunger Vital Sign    Worried About Running Out of Food in the Last Year: Never true    Ran Out of Food in the Last Year: Never true  Transportation Needs: No Transportation Needs (12/17/2021)   PRAPARE - Hydrologist (  Medical): No    Lack of Transportation (Non-Medical): No  Physical Activity: Not on file  Stress: No Stress Concern Present (12/17/2021)   [place]    Feeling of Stress : Not at all  Social Connections: Not on file  Intimate Partner Violence: Not on file     OBSERVATIONS/OBJECTIVE:  BP (!) 143/65 (BP Location: Left Arm, Patient Position: Sitting)   Pulse 89   Temp (!) 97.3 F (36.3 C) (Temporal)   Resp 17   Wt 170 lb 1 oz (77.1 kg)   SpO2 100%   BMI 32.13 kg/m  GENERAL: Patient is a well appearing female in no acute distress HEENT:  Sclerae anicteric.  Oropharynx clear and moist. No ulcerations or evidence of oropharyngeal candidiasis. Neck is supple.  NODES:  No cervical, supraclavicular, or axillary lymphadenopathy palpated.  BREAST EXAM:  Left breast s/p lumpectomy and radiation, no sign of local recurrence, right breast benign LUNGS:  Clear to auscultation bilaterally.  No wheezes or rhonchi. HEART:  Regular rate and rhythm. No murmur appreciated. ABDOMEN:  Soft, nontender.  Positive, normoactive bowel  sounds. No organomegaly palpated. MSK:  No focal spinal tenderness to palpation. Full range of motion bilaterally in the upper extremities. EXTREMITIES:  No peripheral edema.   SKIN:  Clear with no obvious rashes or skin changes. No nail dyscrasia. NEURO:  Nonfocal. Well oriented.  Appropriate affect.   LABORATORY DATA:  None for this visit.  DIAGNOSTIC IMAGING:  None for this visit.      ASSESSMENT AND PLAN:  Ms.. Fleming is a pleasant 72 y.o. female with Stage IB left breast invasive ductal carcinoma, ER-/PR-/HER2-, diagnosed in 05/2021, treated with lumpectomy, adjuvant  chemotherapy, and adjuvant radiation therapy.  She presents to the Survivorship Clinic for our initial meeting and routine follow-up post-completion of treatment for breast cancer.    1. Stage IB left breast cancer:  Sheri Fleming is continuing to recover from definitive treatment for breast cancer. She will follow-up with her medical oncologist, Dr. Lindi Adie in 6 months with history and physical exam per surveillance protocol.  Her mammogram is due 05/2022; orders placed today. Today, a comprehensive survivorship care plan and treatment summary was reviewed with the patient today detailing her breast cancer diagnosis, treatment course, potential late/long-term effects of treatment, appropriate follow-up care with recommendations for the future, and patient education resources.  A copy of this summary, along with a letter will be sent to the patient's primary care provider via mail/fax/In Basket message after today's visit.    2. Bone health:  She was given education on specific activities to promote bone health.  3. Cancer screening:  Due to Sheri Fleming's history and her age, she should receive screening for skin cancers, colon cancer.  The information and recommendations are listed on the patient's comprehensive care plan/treatment summary and were reviewed in detail with the patient.    4. Health maintenance and wellness promotion:  Sheri Fleming was encouraged to consume 5-7 servings of fruits and vegetables per day. We reviewed the "Nutrition Rainbow" handout.  She was also encouraged to engage in moderate to vigorous exercise for 30 minutes per day most days of the week. She was instructed to limit her alcohol consumption and continue to abstain from tobacco use.   5. Support services/counseling: It is not uncommon for this period of the patient's cancer care trajectory to be one of many emotions and stressors.  She was given information regarding our available  services and encouraged to contact me with any questions or for help enrolling in any of our support group/programs.    Follow up instructions:    -Return to cancer center in 6 months  -Mammogram due in 05/2022 -She is welcome to return back to the Survivorship Clinic at any time; no additional follow-up needed at this time.  -Consider referral back to survivorship as a long-term survivor for continued surveillance  The patient was provided an opportunity to ask questions and all were answered. The patient agreed with the plan and demonstrated an understanding of the instructions.   Total encounter time:40 minutes*in face-to-face visit time, chart review, lab review, care coordination, order entry, and documentation of the encounter time.  Wilber Bihari, NP 03/05/22 9:46 AM [facility] [place], [place] [address] Tel. [phone]    Fax. [phone]  *Total Encounter Time as defined by the Centers for Medicare and Medicaid Services includes, in addition to the face-to-face time of a patient visit (documented in the note above) non-face-to-face time: obtaining and reviewing outside history, ordering and reviewing medications, tests or procedures, care coordination (communications with other health care professionals or caregivers) and documentation in the medical record.

## 2022-03-10 ENCOUNTER — Other Ambulatory Visit: Payer: Self-pay

## 2022-03-10 DIAGNOSIS — Z171 Estrogen receptor negative status [ER-]: Secondary | ICD-10-CM

## 2022-03-10 NOTE — Progress Notes (Signed)
[facility] order placed per MD. Order requistion and all supporting documents faxed to [phone]. Fax confirmation received.

## 2022-03-14 DIAGNOSIS — C50412 Malignant neoplasm of upper-outer quadrant of left female breast: Secondary | ICD-10-CM | POA: Diagnosis not present

## 2022-03-14 DIAGNOSIS — Z171 Estrogen receptor negative status [ER-]: Secondary | ICD-10-CM | POA: Diagnosis not present

## 2022-03-21 ENCOUNTER — Other Ambulatory Visit: Payer: Self-pay | Admitting: Family Medicine

## 2022-03-26 ENCOUNTER — Ambulatory Visit (INDEPENDENT_AMBULATORY_CARE_PROVIDER_SITE_OTHER): Payer: 59 | Admitting: Family Medicine

## 2022-03-26 ENCOUNTER — Encounter: Payer: Self-pay | Admitting: Family Medicine

## 2022-03-26 VITALS — BP 122/80 | HR 85 | Ht 62.0 in | Wt 165.1 lb

## 2022-03-26 DIAGNOSIS — E559 Vitamin D deficiency, unspecified: Secondary | ICD-10-CM

## 2022-03-26 DIAGNOSIS — I10 Essential (primary) hypertension: Secondary | ICD-10-CM | POA: Diagnosis not present

## 2022-03-26 DIAGNOSIS — Z0000 Encounter for general adult medical examination without abnormal findings: Secondary | ICD-10-CM

## 2022-03-26 DIAGNOSIS — E785 Hyperlipidemia, unspecified: Secondary | ICD-10-CM | POA: Diagnosis not present

## 2022-03-26 DIAGNOSIS — R7301 Impaired fasting glucose: Secondary | ICD-10-CM | POA: Diagnosis not present

## 2022-03-26 DIAGNOSIS — Z0001 Encounter for general adult medical examination with abnormal findings: Secondary | ICD-10-CM

## 2022-03-26 DIAGNOSIS — E0789 Other specified disorders of thyroid: Secondary | ICD-10-CM

## 2022-03-26 DIAGNOSIS — Z1159 Encounter for screening for other viral diseases: Secondary | ICD-10-CM

## 2022-03-26 NOTE — Progress Notes (Signed)
Complete physical exam  Patient: Sheri Fleming   DOB: 04-28-1950   72 y.o. Female  MRN: [id number]  Subjective:    Chief Complaint  Patient presents with   Annual Exam    Cpe today.     Sheri Fleming is a 72 y.o. female who presents today for a complete physical exam. She reports consuming a general diet.  She reports walking daily  She generally feels well. She reports sleeping fairly well. She does not have additional problems to discuss today.    Most recent fall risk assessment:    12/17/2021    9:09 AM  [place] in the past year? 0  Number falls in past yr: 0  Injury with Fall? 0  Risk for fall due to : No Fall Risks  Follow up Falls evaluation completed     Most recent depression screenings:    03/26/2022   10:07 AM 12/17/2021    9:09 AM  PHQ 2/9 Scores  PHQ - 2 Score 3 0  PHQ- 9 Score 3     Vision:Not within last year   Patient Active Problem List   Diagnosis Date Noted   Obesity (BMI 30-39.9) 09/20/2021   Encounter for general adult medical examination with abnormal findings 06/11/2021   Malignant neoplasm of upper-outer quadrant of left breast in female, estrogen receptor negative ([place]) 05/24/2021   High blood pressure 05/21/2021   Asthma 05/21/2021   Osteoporosis 05/21/2021   Hyperlipidemia 05/21/2021   Past Medical History:  Diagnosis Date   Arthritis    Asthma    Cancer ([place]) 05/22/2021   breast cancer   High cholesterol    Hypertension    Port-A-Cath in place 08/12/2021   Sleep apnea    Past Surgical History:  Procedure Laterality Date   ABDOMINAL HYSTERECTOMY     BREAST LUMPECTOMY WITH RADIOACTIVE SEED AND SENTINEL LYMPH NODE BIOPSY Left 06/26/2021   Procedure: LEFT BREAST LUMPECTOMY WITH RADIOACTIVE SEED AND SENTINEL LYMPH NODE BIOPSY;  Surgeon: Jovita Kussmaul, MD;  Location: [facility];  Service: General;  Laterality: Left;  GEN & PEC BLOCK   FRACTURE SURGERY Left    left lower leg   PORT-A-CATH REMOVAL Right 12/20/2021    Procedure: REMOVAL PORT-A-CATH;  Surgeon: Jovita Kussmaul, MD;  Location: [facility];  Service: General;  Laterality: Right;   PORTACATH PLACEMENT N/A 06/26/2021   Procedure: PORT PLACEMENT;  Surgeon: Jovita Kussmaul, MD;  Location: [facility];  Service: General;  Laterality: N/A;   Social History   Tobacco Use   Smoking status: Never   Smokeless tobacco: Never  Vaping Use   Vaping Use: Never used  Substance Use Topics   Alcohol use: No   Drug use: No   Social History   Socioeconomic History   Marital status: Divorced    Spouse name: Not on file   Number of children: 4   Years of education: Not on file   Highest education level: Not on file  Occupational History   Not on file  Tobacco Use   Smoking status: Never   Smokeless tobacco: Never  Vaping Use   Vaping Use: Never used  Substance and Sexual Activity   Alcohol use: No   Drug use: No   Sexual activity: Not Currently  Other Topics Concern   Not on file  Social History Narrative   Lives with her ex husband. Has 4 children. Retired.    Social Determinants of Health  Financial Resource Strain: Low Risk  (12/17/2021)   Overall Financial Resource Strain (CARDIA)    Difficulty of Paying Living Expenses: Not hard at all  Food Insecurity: No Food Insecurity (12/17/2021)   Hunger Vital Sign    Worried About Running Out of Food in the Last Year: Never true    Ran Out of Food in the Last Year: Never true  Transportation Needs: No Transportation Needs (12/17/2021)   PRAPARE - Hydrologist (Medical): No    Lack of Transportation (Non-Medical): No  Physical Activity: Not on file  Stress: No Stress Concern Present (12/17/2021)   [place]    Feeling of Stress : Not at all  Social Connections: Not on file  Intimate Partner Violence: Not on file   Family Status  Relation Name Status   Mother  Deceased   Father   Deceased   Sister  Alive   Brother  Deceased   Son Sheri Fleming (Not Specified)   Other  (Not Specified)   Neg Hx  (Not Specified)   Family History  Problem Relation Age of Onset   Stroke Mother    Hypertension Mother    Lung cancer Father        d. 42s   Vaginal cancer Sister 48   Cancer Brother        unknown type; dx 19s; mets   Hypertension Son    Stroke Son    Kidney failure Son    Hypercholesterolemia Son    Stroke Other    Colon cancer Neg Hx    Breast cancer Neg Hx    Allergies  Allergen Reactions   Taxotere [Docetaxel] Other (See Comments)    Pt had hypersensitivity rxn to Docetaxel. See progress note from 07/29/21 at 1419. Infusion discontinued.       Patient Care Team: Alvira Monday, [place] as PCP - General (Family Medicine) Jovita Kussmaul, MD as Consulting Physician (General Surgery) Nicholas Lose, MD as Consulting Physician (Hematology and Oncology) Kyung Rudd, MD as Consulting Physician (Radiation Oncology)   Outpatient Medications Prior to Visit  Medication Sig   acetaminophen (TYLENOL) 500 MG tablet Take 500 mg by mouth every 6 (six) hours as needed for mild pain.   alendronate (FOSAMAX) 70 MG tablet TAKE 1 TABLET BY MOUTH 1 TIME A WEEK   lisinopril-hydrochlorothiazide (ZESTORETIC) 20-25 MG tablet TAKE 1 TABLET BY MOUTH EVERY DAY   montelukast (SINGULAIR) 10 MG tablet Take 1 tablet (10 mg total) by mouth at bedtime.   oxyCODONE (ROXICODONE) 5 MG immediate release tablet Take 1 tablet (5 mg total) by mouth every 6 (six) hours as needed for severe pain.   simvastatin (ZOCOR) 20 MG tablet Take 1 tablet (20 mg total) by mouth every evening.   No facility-administered medications prior to visit.    Review of Systems  Constitutional:  Negative for chills, fever and malaise/fatigue.  HENT:  Negative for congestion and sinus pain.   Eyes:  Negative for pain, discharge and redness.  Respiratory:  Negative for cough, sputum production and shortness of breath.    Cardiovascular:  Negative for chest pain, palpitations, claudication and leg swelling.  Gastrointestinal:  Negative for diarrhea, heartburn and nausea.  Genitourinary:  Negative for flank pain and frequency.  Musculoskeletal:  Negative for back pain and joint pain.  Skin:  Negative for itching.  Neurological:  Negative for dizziness, seizures and headaches.  Endo/Heme/Allergies:  Negative for environmental allergies.  Psychiatric/Behavioral:  Negative for memory loss. The patient does not have insomnia.        Objective:    BP 122/80 (BP Location: Right Arm)   Pulse 85   Ht 5' 2"$  (1.575 m)   Wt 165 lb 1.9 oz (74.9 kg)   SpO2 97%   BMI 30.20 kg/m  BP Readings from Last 3 Encounters:  03/26/22 122/80  03/05/22 (!) 143/65  12/20/21 (!) 146/73   Wt Readings from Last 3 Encounters:  03/26/22 165 lb 1.9 oz (74.9 kg)  03/05/22 170 lb 1 oz (77.1 kg)  12/30/21 166 lb 2 oz (75.4 kg)      Physical Exam HENT:     Head: Normocephalic.     Left Ear: External ear normal.     Mouth/Throat:     Mouth: Mucous membranes are moist.  Eyes:     Extraocular Movements: Extraocular movements intact.     Pupils: Pupils are equal, round, and reactive to light.  Cardiovascular:     Rate and Rhythm: Normal rate and regular rhythm.     Heart sounds: No murmur heard. Pulmonary:     Effort: Pulmonary effort is normal.     Breath sounds: Normal breath sounds.  Abdominal:     Palpations: Abdomen is soft.     Tenderness: There is no right CVA tenderness or left CVA tenderness.  Musculoskeletal:     Right lower leg: No edema.     Left lower leg: No edema.  Skin:    Findings: No lesion or rash.  Neurological:     Mental Status: She is alert and oriented to person, place, and time.     GCS: GCS eye subscore is 4. GCS verbal subscore is 5. GCS motor subscore is 6.     Cranial Nerves: No facial asymmetry.     Sensory: No sensory deficit.     Motor: No weakness.     Coordination: Coordination  normal. Finger-Nose-Finger Test normal.     Gait: Gait normal.  Psychiatric:        Judgment: Judgment normal.     No results found for any visits on 03/26/22. Last CBC Lab Results  Component Value Date   WBC 6.1 10/01/2021   HGB 11.0 (L) 10/01/2021   HCT 31.3 (L) 10/01/2021   MCV 91.0 10/01/2021   MCH 32.0 10/01/2021   RDW 13.8 10/01/2021   PLT 289 [id number]   Last metabolic panel Lab Results  Component Value Date   GLUCOSE 69 (L) 12/18/2021   NA 140 12/18/2021   K 4.2 12/18/2021   CL 103 12/18/2021   CO2 24 12/18/2021   BUN 17 12/18/2021   CREATININE 0.86 12/18/2021   GFRNONAA >60 12/18/2021   CALCIUM 9.5 12/18/2021   PROT 7.1 10/01/2021   ALBUMIN 4.3 10/01/2021   BILITOT 0.5 10/01/2021   ALKPHOS 39 10/01/2021   AST 14 (L) 10/01/2021   ALT 10 10/01/2021   ANIONGAP 13 12/18/2021   Last lipids Lab Results  Component Value Date   CHOL 142 09/27/2021   HDL 43 09/27/2021   LDLCALC 78 09/27/2021   TRIG 113 09/27/2021   CHOLHDL 3.3 09/27/2021   Last hemoglobin A1c No results found for: "HGBA1C" Last thyroid functions No results found for: "TSH", "T3TOTAL", "T4TOTAL", "THYROIDAB" Last vitamin D No results found for: "25OHVITD2", "25OHVITD3", "VD25OH" Last vitamin B12 and Folate No results found for: "VITAMINB12", "FOLATE"      Assessment & Plan:    Routine Health Maintenance and  Physical Exam  Immunization History  Administered Date(s) Administered   Influenza-Unspecified 01/22/2022   Moderna SARS-COV2 Booster Vaccination 03/26/2020   Moderna Sars-Covid-2 Vaccination 06/07/2019, 07/15/2019   Td 06/16/1997    Health Maintenance  Topic Date Due   Hepatitis C Screening  Never done   Zoster Vaccines- Shingrix (1 of 2) Never done   DTaP/Tdap/Td (2 - Tdap) 06/17/2007   COVID-19 Vaccine (3 - Moderna risk series) 04/23/2020   Pneumonia Vaccine 50+ Years old (1 of 1 - PCV) 09/21/2022 (Originally 11/26/2013)   DEXA SCAN  09/21/2022 (Originally 11/26/2013)    Medicare Annual Wellness (AWV)  12/18/2022   MAMMOGRAM  05/17/2023   Fecal DNA (Cologuard)  10/01/2024   INFLUENZA VACCINE  Completed   HPV VACCINES  Aged Out    Discussed health benefits of physical activity, and encouraged her to engage in regular exercise appropriate for her age and condition.  Encounter for general adult medical examination with abnormal findings Assessment & Plan: Physical exam as documented Counseling is done on healthy lifestyle involving commitment to 150 minutes of exercise per week,  Discussed heart-healthy diet  Follow-up in 1 year for CPE      Hyperlipidemia, unspecified hyperlipidemia type -     Lipid panel  Primary hypertension -     CBC with Differential/Platelet -     CMP14+EGFR  Vitamin D deficiency -     VITAMIN D 25 Hydroxy (Vit-D Deficiency, Fractures)  Impaired fasting blood sugar -     Hemoglobin A1c  Encounter for hepatitis C screening test for low risk patient  Other specified disorders of thyroid -     TSH + free T4  Encounter for annual physical exam  Need for hepatitis C screening test -     Hepatitis C antibody    Return in about 1 year (around 03/27/2023) for CPE.     Alvira Monday, FNP

## 2022-03-26 NOTE — Assessment & Plan Note (Signed)
Physical exam as documented Counseling is done on healthy lifestyle involving commitment to 150 minutes of exercise per week,  Discussed heart-healthy diet  Follow-up in 1 year for CPE

## 2022-03-26 NOTE — Patient Instructions (Signed)
I appreciate the opportunity to provide care to you today!    Follow up:  3 months  Labs: please stop by the lab today to get your blood drawn (CBC, CMP, TSH, Lipid profile, HgA1c, Vit D)  Screening:  Hep C    Please continue to a heart-healthy diet and increase your physical activities. Try to exercise for 36mns at least five times a week.      It was a pleasure to see you and I look forward to continuing to work together on your health and well-being. Please do not hesitate to call the office if you need care or have questions about your care.   Have a wonderful day and week. With Gratitude, GAlvira MondayMSN, FNP-BC

## 2022-03-31 ENCOUNTER — Ambulatory Visit: Payer: 59

## 2022-03-31 DIAGNOSIS — I10 Essential (primary) hypertension: Secondary | ICD-10-CM | POA: Diagnosis not present

## 2022-03-31 DIAGNOSIS — E559 Vitamin D deficiency, unspecified: Secondary | ICD-10-CM | POA: Diagnosis not present

## 2022-03-31 DIAGNOSIS — Z1159 Encounter for screening for other viral diseases: Secondary | ICD-10-CM | POA: Diagnosis not present

## 2022-03-31 DIAGNOSIS — R7301 Impaired fasting glucose: Secondary | ICD-10-CM | POA: Diagnosis not present

## 2022-03-31 DIAGNOSIS — E785 Hyperlipidemia, unspecified: Secondary | ICD-10-CM | POA: Diagnosis not present

## 2022-03-31 DIAGNOSIS — E0789 Other specified disorders of thyroid: Secondary | ICD-10-CM | POA: Diagnosis not present

## 2022-04-01 NOTE — Progress Notes (Signed)
Your labs indicate that you are prediabetic. I recommend avoiding simple carbohydrates, including cakes, sweet desserts, ice cream, soda (diet or regular), sweet tea, candies, chips, cookies, store-bought juices, alcohol in excess of 1-2 drinks a day, lemonade, artificial sweeteners, donuts, coffee creamers, and sugar-free products.  I recommend avoiding greasy, fatty foods with increased physical activity.  All other labs are stable

## 2022-04-02 ENCOUNTER — Telehealth: Payer: Self-pay | Admitting: Hematology and Oncology

## 2022-04-02 ENCOUNTER — Telehealth: Payer: Self-pay | Admitting: *Deleted

## 2022-04-02 LAB — CBC WITH DIFFERENTIAL/PLATELET
Basophils Absolute: 0.1 10*3/uL (ref 0.0–0.2)
Basos: 2 %
EOS (ABSOLUTE): 0 10*3/uL (ref 0.0–0.4)
Eos: 0 %
Hematocrit: 37.6 % (ref 34.0–46.6)
Hemoglobin: 12.3 g/dL (ref 11.1–15.9)
Immature Grans (Abs): 0 10*3/uL (ref 0.0–0.1)
Immature Granulocytes: 0 %
Lymphocytes Absolute: 2.1 10*3/uL (ref 0.7–3.1)
Lymphs: 40 %
MCH: 30 pg (ref 26.6–33.0)
MCHC: 32.7 g/dL (ref 31.5–35.7)
MCV: 92 fL (ref 79–97)
Monocytes Absolute: 0.7 10*3/uL (ref 0.1–0.9)
Monocytes: 12 %
Neutrophils Absolute: 2.4 10*3/uL (ref 1.4–7.0)
Neutrophils: 46 %
Platelets: 251 10*3/uL (ref 150–450)
RBC: 4.1 x10E6/uL (ref 3.77–5.28)
RDW: 13.3 % (ref 11.7–15.4)
WBC: 5.4 10*3/uL (ref 3.4–10.8)

## 2022-04-02 LAB — HEMOGLOBIN A1C
Est. average glucose Bld gHb Est-mCnc: 126 mg/dL
Hgb A1c MFr Bld: 6 % — ABNORMAL HIGH (ref 4.8–5.6)

## 2022-04-02 LAB — LIPID PANEL
Chol/HDL Ratio: 2.6 ratio (ref 0.0–4.4)
Cholesterol, Total: 148 mg/dL (ref 100–199)
HDL: 58 mg/dL (ref >39)
LDL Chol Calc (NIH): 77 mg/dL (ref 0–99)
Triglycerides: 65 mg/dL (ref 0–149)
VLDL Cholesterol Cal: 13 mg/dL (ref 5–40)

## 2022-04-02 LAB — CMP14+EGFR
ALT: 13 IU/L (ref 0–32)
AST: 22 IU/L (ref 0–40)
Albumin/Globulin Ratio: 2.1 (ref 1.2–2.2)
Albumin: 4.8 g/dL (ref 3.8–4.8)
Alkaline Phosphatase: 41 IU/L — ABNORMAL LOW (ref 44–121)
BUN/Creatinine Ratio: 17 (ref 12–28)
BUN: 15 mg/dL (ref 8–27)
Bilirubin Total: 0.6 mg/dL (ref 0.0–1.2)
CO2: 22 mmol/L (ref 20–29)
Calcium: 9.5 mg/dL (ref 8.7–10.3)
Chloride: 100 mmol/L (ref 96–106)
Creatinine, Ser: 0.89 mg/dL (ref 0.57–1.00)
Globulin, Total: 2.3 g/dL (ref 1.5–4.5)
Glucose: 82 mg/dL (ref 70–99)
Potassium: 3.7 mmol/L (ref 3.5–5.2)
Sodium: 140 mmol/L (ref 134–144)
Total Protein: 7.1 g/dL (ref 6.0–8.5)
eGFR: 68 mL/min/{1.73_m2} (ref >59)

## 2022-04-02 LAB — TSH+FREE T4
Free T4: 1.23 ng/dL (ref 0.82–1.77)
TSH: 1.46 u[IU]/mL (ref 0.450–4.500)

## 2022-04-02 LAB — HEPATITIS C ANTIBODY: Hep C Virus Ab: NONREACTIVE

## 2022-04-02 LAB — SIGNATERA ONLY (NATERA MANAGED)
SIGNATERA MTM READOUT: 0 MTM/ml
SIGNATERA TEST RESULT: NEGATIVE

## 2022-04-02 LAB — VITAMIN D 25 HYDROXY (VIT D DEFICIENCY, FRACTURES): Vit D, 25-Hydroxy: 38.1 ng/mL (ref 30.0–100.0)

## 2022-04-02 NOTE — Telephone Encounter (Signed)
Per 2/28 IB reached out to schedule patient, mailbox is full.

## 2022-04-02 NOTE — Telephone Encounter (Signed)
Per 2/28 IB reached out to patients daughter to schedule, left voicemail.

## 2022-04-02 NOTE — Telephone Encounter (Signed)
Per 2/28 IB scheduled patient for follow up,  patients daughter aware of time and date of appointment.

## 2022-04-02 NOTE — Telephone Encounter (Signed)
Per MD request, RN placed call to pt regarding negative (Not Detected) recent Signatera testing.  No answer, unable to LVM due to VM being full.

## 2022-04-03 ENCOUNTER — Encounter (HOSPITAL_COMMUNITY): Payer: Self-pay

## 2022-04-10 ENCOUNTER — Telehealth: Payer: Self-pay

## 2022-04-10 ENCOUNTER — Other Ambulatory Visit: Payer: Self-pay

## 2022-04-10 DIAGNOSIS — Z171 Estrogen receptor negative status [ER-]: Secondary | ICD-10-CM

## 2022-04-10 DIAGNOSIS — C50412 Malignant neoplasm of upper-outer quadrant of left female breast: Secondary | ICD-10-CM

## 2022-04-10 NOTE — Telephone Encounter (Signed)
Faxed MM diag order to [facility] with receipt confirmation per Pt request. Attempted to contact Pt to relay order has been faxed but voicemail is full. LVM with daughter, Juanetta Gosling, to relay message to Pt. Gave call back number with any questions.

## 2022-05-05 DIAGNOSIS — M79671 Pain in right foot: Secondary | ICD-10-CM | POA: Diagnosis not present

## 2022-05-05 DIAGNOSIS — B351 Tinea unguium: Secondary | ICD-10-CM | POA: Diagnosis not present

## 2022-05-05 DIAGNOSIS — M79674 Pain in right toe(s): Secondary | ICD-10-CM | POA: Diagnosis not present

## 2022-05-05 DIAGNOSIS — L609 Nail disorder, unspecified: Secondary | ICD-10-CM | POA: Diagnosis not present

## 2022-05-05 DIAGNOSIS — M79672 Pain in left foot: Secondary | ICD-10-CM | POA: Diagnosis not present

## 2022-05-05 DIAGNOSIS — M79675 Pain in left toe(s): Secondary | ICD-10-CM | POA: Diagnosis not present

## 2022-05-16 DIAGNOSIS — Z853 Personal history of malignant neoplasm of breast: Secondary | ICD-10-CM | POA: Diagnosis not present

## 2022-05-16 LAB — HM MAMMOGRAPHY

## 2022-05-20 ENCOUNTER — Encounter: Payer: Self-pay | Admitting: Adult Health

## 2022-05-31 ENCOUNTER — Other Ambulatory Visit: Payer: Self-pay

## 2022-05-31 ENCOUNTER — Encounter (HOSPITAL_COMMUNITY): Payer: Self-pay | Admitting: Emergency Medicine

## 2022-05-31 ENCOUNTER — Emergency Department (HOSPITAL_COMMUNITY)
Admission: EM | Admit: 2022-05-31 | Discharge: 2022-05-31 | Disposition: A | Discharge status: Home / Self Care | Payer: 59 | Attending: Emergency Medicine | Admitting: Emergency Medicine

## 2022-05-31 DIAGNOSIS — M542 Cervicalgia: Secondary | ICD-10-CM | POA: Diagnosis not present

## 2022-05-31 DIAGNOSIS — I10 Essential (primary) hypertension: Secondary | ICD-10-CM | POA: Diagnosis not present

## 2022-05-31 DIAGNOSIS — M436 Torticollis: Secondary | ICD-10-CM | POA: Diagnosis not present

## 2022-05-31 DIAGNOSIS — Z79899 Other long term (current) drug therapy: Secondary | ICD-10-CM | POA: Diagnosis not present

## 2022-05-31 MED ORDER — NAPROXEN 250 MG PO TABS
500.0000 mg | ORAL_TABLET | Freq: Once | ORAL | Status: AC
  Administered 2022-05-31: 500 mg via ORAL
  Filled 2022-05-31: qty 2

## 2022-05-31 MED ORDER — METHOCARBAMOL 500 MG PO TABS: 500.0000 mg | ORAL_TABLET | Freq: Two times a day (BID) | ORAL | 0 refills | Status: DC | PRN

## 2022-05-31 MED ORDER — NAPROXEN 500 MG PO TABS: 500.0000 mg | ORAL_TABLET | Freq: Two times a day (BID) | ORAL | 0 refills | Status: DC

## 2022-05-31 NOTE — ED Triage Notes (Signed)
Pt reports waking up at 3 am with right sided neck pain. States she is unsure if it is from sleeping on the pillow wrong. Pain does not radiate.

## 2022-05-31 NOTE — Discharge Instructions (Signed)
Please take Naprosyn, 500mg by mouth twice daily as needed for pain - this in an antiinflammatory medicine (NSAID) and is similar to ibuprofen - many people feel that it is stronger than ibuprofen and it is easier to take since it is a smaller pill.  Please use this only for 1 week - if your pain persists, you will need to follow up with your doctor in the office for ongoing guidance and pain control.  Please take Robaxin, 500 mg up to 2 or 3 times a day as needed for muscle spasm, this is a muscle relaxer, it may cause generalized weakness, sleepiness and you should not drive or do important things while taking this medication.  This includes driving a vehicle or taking care of young children, these things should not be done while taking this medication.    Thank you for allowing us to treat you in the emergency department today.  After reviewing your examination and potential testing that was done it appears that you are safe to go home.  I would like for you to follow-up with your doctor within the next several days, have them obtain your results and follow-up with them to review all of these tests.  If you should develop severe or worsening symptoms return to the emergency department immediately 

## 2022-05-31 NOTE — ED Provider Notes (Signed)
[place] EMERGENCY DEPARTMENT AT [facility] Provider Note   CSN: [id number] Arrival date & time: 05/31/22  4098     History  Chief Complaint  Patient presents with   Neck Pain    Sheri Fleming is a 72 y.o. female.   Neck Pain  72 year old female, history of hypertension and high cholesterol taking her medications as prescribed.  She presents with a pain in the right side of her neck that awoke her at about 3:30 in the morning, she states that the pain is worse when she tries to move her head to the right better on the left, it is only on the right lateral neck.  There is no difficulty breathing swallowing or talking.  She has had something similar in the past which she describes as "a crick in my neck".  She denies any numbness or weakness of the right upper extremity or left upper extremity, no chest pain or shortness of breath, no fevers or chills, no nausea or vomiting.  She has had no medications prior to arrival.  She denies sleeping in any other unique situations or new pillows or strains or injuries or trauma    Home Medications Prior to Admission medications   Medication Sig Start Date End Date Taking? Authorizing Provider  methocarbamol (ROBAXIN) 500 MG tablet Take 1 tablet (500 mg total) by mouth 2 (two) times daily as needed for muscle spasms. 05/31/22  Yes Eber Hong, MD  naproxen (NAPROSYN) 500 MG tablet Take 1 tablet (500 mg total) by mouth 2 (two) times daily with a meal. 05/31/22  Yes Eber Hong, MD  acetaminophen (TYLENOL) 500 MG tablet Take 500 mg by mouth every 6 (six) hours as needed for mild pain.    [provider]  alendronate (FOSAMAX) 70 MG tablet TAKE 1 TABLET BY MOUTH 1 TIME A WEEK 12/12/21   Gilmore Laroche, FNP  lisinopril-hydrochlorothiazide (ZESTORETIC) 20-25 MG tablet TAKE 1 TABLET BY MOUTH EVERY DAY 03/21/22   Gilmore Laroche, FNP  montelukast (SINGULAIR) 10 MG tablet Take 1 tablet (10 mg total) by mouth at bedtime. 02/17/22    Gilmore Laroche, FNP  oxyCODONE (ROXICODONE) 5 MG immediate release tablet Take 1 tablet (5 mg total) by mouth every 6 (six) hours as needed for severe pain. 12/20/21   Chevis Pretty III, MD  simvastatin (ZOCOR) 20 MG tablet Take 1 tablet (20 mg total) by mouth every evening. 02/05/22   Gilmore Laroche, FNP      Allergies    Taxotere [docetaxel]    Review of Systems   Review of Systems  Musculoskeletal:  Positive for neck pain.  All other systems reviewed and are negative.   Physical Exam Updated Vital Signs BP (!) 162/81   Pulse (!) 101   Temp 98.6 F (37 C) (Oral)   Resp 16   Ht 1.575 m (5\' 2" )   Wt 72.6 kg   SpO2 100%   BMI 29.26 kg/m  Physical Exam Vitals and nursing note reviewed.  Constitutional:      General: She is not in acute distress.    Appearance: She is well-developed.  HENT:     Head: Normocephalic and atraumatic.     Mouth/Throat:     Pharynx: No oropharyngeal exudate.  Eyes:     General: No scleral icterus.       Right eye: No discharge.        Left eye: No discharge.     Conjunctiva/sclera: Conjunctivae normal.  Pupils: Pupils are equal, round, and reactive to light.  Neck:     Thyroid: No thyromegaly.     Vascular: No JVD.  Cardiovascular:     Rate and Rhythm: Normal rate and regular rhythm.     Heart sounds: Normal heart sounds. No murmur heard.    No friction rub. No gallop.  Pulmonary:     Effort: Pulmonary effort is normal. No respiratory distress.     Breath sounds: Normal breath sounds. No wheezing or rales.  Abdominal:     General: Bowel sounds are normal. There is no distension.     Palpations: Abdomen is soft. There is no mass.     Tenderness: There is no abdominal tenderness.  Musculoskeletal:        General: Tenderness present. Normal range of motion.     Cervical back: Normal range of motion and neck supple.     Comments: The neck is tender over the right lateral neck just distal to the mastoid process, there is no obvious  asymmetry lumps or masses, no lymphadenopathy, the neck is actually very supple and can move in all directions full range of motion but tenderness when she moves to the right.  She has no lymphadenopathy under the neck and no trismus or torticollis.  Lymphadenopathy:     Cervical: No cervical adenopathy.  Skin:    General: Skin is warm and dry.     Findings: No erythema or rash.  Neurological:     Mental Status: She is alert.     Coordination: Coordination normal.     Comments: Normal strength and sensation of the bilateral upper extremities, normal speech cranial nerves III through XII are normal and gait is normal  Psychiatric:        Behavior: Behavior normal.     ED Results / Procedures / Treatments   Labs (all labs ordered are listed, but only abnormal results are displayed) Labs Reviewed - No data to display  EKG None  Radiology No results found.  Procedures Procedures    Medications Ordered in ED Medications  naproxen (NAPROSYN) tablet 500 mg (has no administration in time range)    ED Course/ Medical Decision Making/ A&P                             Medical Decision Making  Exam is consistent with having a likely torticollis of some degree.  It is minimal, her vital signs reflect mild hypertension, her heart rate is currently 95, she has normal pulses sensation and strength to the right and left upper extremities making this unlikely to be neurologic, I doubt meningitis that she has a very supple neck and no other signs of meningitis, this is likely muscular, she will be given an anti-inflammatory as well as prescriptions for NSAIDs and an muscle relaxer.  She is agreeable to the plan.          Final Clinical Impression(s) / ED Diagnoses Final diagnoses:  Neck pain  Torticollis    Rx / DC Orders ED Discharge Orders          Ordered    naproxen (NAPROSYN) 500 MG tablet  2 times daily with meals        05/31/22 0919    methocarbamol (ROBAXIN) 500 MG  tablet  2 times daily PRN        05/31/22 0919  Eber Hong, MD 05/31/22 [phone]

## 2022-06-03 ENCOUNTER — Telehealth: Payer: Self-pay | Admitting: *Deleted

## 2022-06-03 NOTE — Transitions of Care (Post Inpatient/ED Visit) (Signed)
   06/03/2022  Name: [name] MRN: [id number] DOB: August 10, 1950  Today's TOC FU Call Status: Today's TOC FU Call Status:: Unsuccessul Call (1st Attempt) Unsuccessful Call (1st Attempt) Date: 06/03/22  Attempted to reach the patient regarding the most recent Inpatient/ED visit.  Follow Up Plan: Additional outreach attempts will be made to reach the patient to complete the Transitions of Care (Post Inpatient/ED visit) call.   Demetrios Loll, BSN, RN-BC RN Care Coordinator [facility]  [facility] Direct Dial: [phone] Main #: [phone]

## 2022-06-05 ENCOUNTER — Telehealth: Payer: Self-pay | Admitting: *Deleted

## 2022-06-05 NOTE — Transitions of Care (Post Inpatient/ED Visit) (Signed)
   06/05/2022  Name: Sheri Fleming MRN: [id number] DOB: 21-Aug-1950  Today's TOC FU Call Status: Today's TOC FU Call Status:: Unsuccessful Call (2nd Attempt) Unsuccessful Call (2nd Attempt) Date: 06/05/22  Attempted to reach the patient regarding the most recent Inpatient/ED visit.  Follow Up Plan: Additional outreach attempts will be made to reach the patient to complete the Transitions of Care (Post Inpatient/ED visit) call.   Demetrios Loll, BSN, RN-BC RN Care Coordinator [facility]  [facility] Direct Dial: [phone] Main #: [phone]

## 2022-06-15 ENCOUNTER — Other Ambulatory Visit: Payer: Self-pay | Admitting: Family Medicine

## 2022-06-15 DIAGNOSIS — J4520 Mild intermittent asthma, uncomplicated: Secondary | ICD-10-CM

## 2022-06-20 ENCOUNTER — Telehealth: Payer: Self-pay | Admitting: *Deleted

## 2022-06-20 DIAGNOSIS — C50412 Malignant neoplasm of upper-outer quadrant of left female breast: Secondary | ICD-10-CM | POA: Diagnosis not present

## 2022-06-20 DIAGNOSIS — Z171 Estrogen receptor negative status [ER-]: Secondary | ICD-10-CM | POA: Diagnosis not present

## 2022-06-20 NOTE — Transitions of Care (Post Inpatient/ED Visit) (Signed)
06/20/2022  Name: Sheri Fleming MRN: [id number] DOB: 25-Jan-1951  Today's TOC FU Call Status: Today's TOC FU Call Status:: Successful TOC FU Call Competed TOC FU Call Complete Date: 06/20/22  Transition Care Management Follow-up Telephone Call Date of Discharge: 05/31/22 [facility] Type of Discharge: Emergency Department Reason for ED Visit: Other: (Neck Pain) Any questions or concerns?: No  Items Reviewed: Did you receive and understand the discharge instructions provided?: Yes Medications obtained,verified, and reconciled?: Yes (Medications Reviewed) Any new allergies since your discharge?: No Dietary orders reviewed?: No Do you have support at home?: Yes People in Home: alone Name of Support/Comfort Primary Source: Verlon Au  Medications Reviewed Today: Medications Reviewed Today     Reviewed by Luella Cook, RN (Case Manager) on 06/20/22 at 1606  Med List Status: <None>   Medication Order Taking? Sig Documenting Provider Last Dose Status Informant  acetaminophen (TYLENOL) 500 MG tablet [id number] Yes Take 500 mg by mouth every 6 (six) hours as needed for mild pain. [provider] Taking Active Self  alendronate (FOSAMAX) 70 MG tablet [id number] Yes TAKE 1 TABLET BY MOUTH 1 TIME A Hollie Beach, FNP Taking Active   lisinopril-hydrochlorothiazide (ZESTORETIC) 20-25 MG tablet [id number] Yes TAKE 1 TABLET BY MOUTH EVERY DAY Gilmore Laroche, FNP Taking Active   methocarbamol (ROBAXIN) 500 MG tablet [id number] Yes Take 1 tablet (500 mg total) by mouth 2 (two) times daily as needed for muscle spasms. Eber Hong, MD Taking Active   montelukast (SINGULAIR) 10 MG tablet [id number] Yes TAKE 1 TABLET BY MOUTH AT  BEDTIME Gilmore Laroche, FNP Taking Active   naproxen (NAPROSYN) 500 MG tablet [id number] Yes Take 1 tablet (500 mg total) by mouth 2 (two) times daily with a meal. Eber Hong, MD Taking Active   oxyCODONE (ROXICODONE) 5 MG  immediate release tablet [id number] Yes Take 1 tablet (5 mg total) by mouth every 6 (six) hours as needed for severe pain. Chevis Pretty III, MD Taking Active   simvastatin (ZOCOR) 20 MG tablet [id number] Yes Take 1 tablet (20 mg total) by mouth every evening. Gilmore Laroche, FNP Taking Active             Home Care and Equipment/Supplies: Were Home Health Services Ordered?: NA Any new equipment or medical supplies ordered?: NA  Functional Questionnaire: Do you need assistance with bathing/showering or dressing?: No Do you need assistance with meal preparation?: No Do you need assistance with eating?: No Do you have difficulty maintaining continence: No Do you need assistance with getting out of bed/getting out of a chair/moving?: No Do you have difficulty managing or taking your medications?: No  Follow up appointments reviewed: PCP Follow-up appointment confirmed?: Yes Date of PCP follow-up appointment?: 06/25/22 Follow-up Provider: Gilmore Laroche Specialist [facility] Follow-up appointment confirmed?: NA Do you need transportation to your follow-up appointment?: No Do you understand care options if your condition(s) worsen?: Yes-patient verbalized understanding  SDOH Interventions Today    Flowsheet Row Most Recent Value  SDOH Interventions   Food Insecurity Interventions Intervention Not Indicated  Housing Interventions Intervention Not Indicated  Transportation Interventions Intervention Not Indicated      Interventions Today    Flowsheet Row Most Recent Value  General Interventions   General Interventions Discussed/Reviewed General Interventions Reviewed, General Interventions Discussed, Doctor Visits  Doctor Visits Discussed/Reviewed Doctor Visits Discussed, Doctor Visits Reviewed      [facility] Interventions Today    Flowsheet Row Most Recent Value  TOC Interventions   TOC Interventions  Discussed/Reviewed TOC Interventions Discussed, TOC Interventions Reviewed        Gean Maidens BSN RN [facility] [phone]

## 2022-06-25 ENCOUNTER — Encounter: Payer: Self-pay | Admitting: Family Medicine

## 2022-06-25 ENCOUNTER — Ambulatory Visit (INDEPENDENT_AMBULATORY_CARE_PROVIDER_SITE_OTHER): Payer: 59 | Admitting: Family Medicine

## 2022-06-25 VITALS — BP 127/77 | HR 85 | Ht 62.0 in | Wt 161.1 lb

## 2022-06-25 DIAGNOSIS — I10 Essential (primary) hypertension: Secondary | ICD-10-CM

## 2022-06-25 DIAGNOSIS — E038 Other specified hypothyroidism: Secondary | ICD-10-CM

## 2022-06-25 DIAGNOSIS — R7301 Impaired fasting glucose: Secondary | ICD-10-CM

## 2022-06-25 DIAGNOSIS — E559 Vitamin D deficiency, unspecified: Secondary | ICD-10-CM | POA: Diagnosis not present

## 2022-06-25 DIAGNOSIS — M810 Age-related osteoporosis without current pathological fracture: Secondary | ICD-10-CM

## 2022-06-25 DIAGNOSIS — E7849 Other hyperlipidemia: Secondary | ICD-10-CM | POA: Diagnosis not present

## 2022-06-25 MED ORDER — ALENDRONATE SODIUM 70 MG PO TABS: ORAL_TABLET | ORAL | 1 refills | Status: DC

## 2022-06-25 NOTE — Assessment & Plan Note (Signed)
Stable on simvastatin 20 mg daily No complaints or concerns reported Pending lipid panel Lab Results  Component Value Date   CHOL 148 03/31/2022   HDL 58 03/31/2022   LDLCALC 77 03/31/2022   TRIG 65 03/31/2022   CHOLHDL 2.6 03/31/2022

## 2022-06-25 NOTE — Assessment & Plan Note (Signed)
Controlled Stable on lisinopril-hydrochlorothiazide 20-25 milligrams daily Asymptomatic in the clinic Low-sodium diet and increase his activity encouraged BP Readings from Last 3 Encounters:  06/25/22 127/77  05/31/22 (!) 162/81  03/26/22 122/80

## 2022-06-25 NOTE — Progress Notes (Signed)
Established Patient Office Visit  Subjective:  Patient ID: Sheri Fleming, female    DOB: Jul 06, 1950  Age: 72 y.o. MRN: [id number]  CC:  Chief Complaint  Patient presents with   Chronic Care Management    3 month f/u    HPI Sheri Fleming is a 72 y.o. female with past medical history of hypertension, osteoporosis, hyperlipidemia presents for f/u of  chronic medical conditions. For the details of today's visit, please refer to the assessment and plan.     Past Medical History:  Diagnosis Date   Arthritis    Asthma    Cancer (HCC) 05/22/2021   breast cancer   High cholesterol    Hypertension    Port-A-Cath in place 08/12/2021   Sleep apnea     Past Surgical History:  Procedure Laterality Date   ABDOMINAL HYSTERECTOMY     BREAST LUMPECTOMY WITH RADIOACTIVE SEED AND SENTINEL LYMPH NODE BIOPSY Left 06/26/2021   Procedure: LEFT BREAST LUMPECTOMY WITH RADIOACTIVE SEED AND SENTINEL LYMPH NODE BIOPSY;  Surgeon: Griselda Miner, MD;  Location: [facility] OR;  Service: General;  Laterality: Left;  GEN & PEC BLOCK   FRACTURE SURGERY Left    left lower leg   PORT-A-CATH REMOVAL Right 12/20/2021   Procedure: REMOVAL PORT-A-CATH;  Surgeon: Griselda Miner, MD;  Location: [facility];  Service: General;  Laterality: Right;   PORTACATH PLACEMENT N/A 06/26/2021   Procedure: PORT PLACEMENT;  Surgeon: Griselda Miner, MD;  Location: [facility] OR;  Service: General;  Laterality: N/A;    Family History  Problem Relation Age of Onset   Stroke Mother    Hypertension Mother    Lung cancer Father        d. 16s   Vaginal cancer Sister 41   Cancer Brother        unknown type; dx 61s; mets   Hypertension Son    Stroke Son    Kidney failure Son    Hypercholesterolemia Son    Stroke Other    Colon cancer Neg Hx    Breast cancer Neg Hx     Social History   Socioeconomic History   Marital status: Divorced    Spouse name: Not on file   Number of children: 4   Years of education: Not on  file   Highest education level: Not on file  Occupational History   Not on file  Tobacco Use   Smoking status: Never   Smokeless tobacco: Never  Vaping Use   Vaping Use: Never used  Substance and Sexual Activity   Alcohol use: No   Drug use: No   Sexual activity: Not Currently  Other Topics Concern   Not on file  Social History Narrative   Lives with her ex husband. Has 4 children. Retired.    Social Determinants of Health   Financial Resource Strain: Low Risk  (12/17/2021)   Overall Financial Resource Strain (CARDIA)    Difficulty of Paying Living Expenses: Not hard at all  Food Insecurity: No Food Insecurity (06/20/2022)   Hunger Vital Sign    Worried About Running Out of Food in the Last Year: Never true    Ran Out of Food in the Last Year: Never true  Transportation Needs: No Transportation Needs (06/20/2022)   PRAPARE - Administrator, Civil Service (Medical): No    Lack of Transportation (Non-Medical): No  Physical Activity: Not on file  Stress: No Stress Concern Present (12/17/2021)  [employer] of Occupational Health - Occupational Stress Questionnaire    Feeling of Stress : Not at all  Social Connections: Not on file  Intimate Partner Violence: Not on file    Outpatient Medications Prior to Visit  Medication Sig Dispense Refill   acetaminophen (TYLENOL) 500 MG tablet Take 500 mg by mouth every 6 (six) hours as needed for mild pain.     lisinopril-hydrochlorothiazide (ZESTORETIC) 20-25 MG tablet TAKE 1 TABLET BY MOUTH EVERY DAY 90 tablet 1   methocarbamol (ROBAXIN) 500 MG tablet Take 1 tablet (500 mg total) by mouth 2 (two) times daily as needed for muscle spasms. 20 tablet 0   montelukast (SINGULAIR) 10 MG tablet TAKE 1 TABLET BY MOUTH AT  BEDTIME 100 tablet 2   naproxen (NAPROSYN) 500 MG tablet Take 1 tablet (500 mg total) by mouth 2 (two) times daily with a meal. 30 tablet 0   oxyCODONE (ROXICODONE) 5 MG immediate release tablet Take 1 tablet  (5 mg total) by mouth every 6 (six) hours as needed for severe pain. 5 tablet 0   simvastatin (ZOCOR) 20 MG tablet Take 1 tablet (20 mg total) by mouth every evening. 90 tablet 3   alendronate (FOSAMAX) 70 MG tablet TAKE 1 TABLET BY MOUTH 1 TIME A WEEK 12 tablet 1   No facility-administered medications prior to visit.    Allergies  Allergen Reactions   Taxotere [Docetaxel] Other (See Comments)    Pt had hypersensitivity rxn to Docetaxel. See progress note from 07/29/21 at 1419. Infusion discontinued.     ROS Review of Systems  Constitutional:  Negative for chills and fever.  Eyes:  Negative for visual disturbance.  Respiratory:  Negative for chest tightness and shortness of breath.   Neurological:  Negative for dizziness and headaches.      Objective:    Physical Exam HENT:     Head: Normocephalic.     Mouth/Throat:     Mouth: Mucous membranes are moist.  Cardiovascular:     Rate and Rhythm: Normal rate.     Heart sounds: Normal heart sounds.  Pulmonary:     Effort: Pulmonary effort is normal.     Breath sounds: Normal breath sounds.  Neurological:     Mental Status: She is alert.     BP 127/77   Pulse 85   Ht 5\' 2"  (1.575 m)   Wt 161 lb 1.3 oz (73.1 kg)   SpO2 98%   BMI 29.46 kg/m  Wt Readings from Last 3 Encounters:  06/25/22 161 lb 1.3 oz (73.1 kg)  05/31/22 160 lb (72.6 kg)  03/26/22 165 lb 1.9 oz (74.9 kg)    Lab Results  Component Value Date   TSH 1.460 03/31/2022   Lab Results  Component Value Date   WBC 5.4 03/31/2022   HGB 12.3 03/31/2022   HCT 37.6 03/31/2022   MCV 92 03/31/2022   PLT 251 03/31/2022   Lab Results  Component Value Date   NA 140 03/31/2022   K 3.7 03/31/2022   CO2 22 03/31/2022   GLUCOSE 82 03/31/2022   BUN 15 03/31/2022   CREATININE 0.89 03/31/2022   BILITOT 0.6 03/31/2022   ALKPHOS 41 (L) 03/31/2022   AST 22 03/31/2022   ALT 13 03/31/2022   PROT 7.1 03/31/2022   ALBUMIN 4.8 03/31/2022   CALCIUM 9.5 03/31/2022    ANIONGAP 13 12/18/2021   EGFR 68 03/31/2022   Lab Results  Component Value Date   CHOL 148 03/31/2022  Lab Results  Component Value Date   HDL 58 03/31/2022   Lab Results  Component Value Date   LDLCALC 77 03/31/2022   Lab Results  Component Value Date   TRIG 65 03/31/2022   Lab Results  Component Value Date   CHOLHDL 2.6 03/31/2022   Lab Results  Component Value Date   HGBA1C 6.0 (H) 03/31/2022      Assessment & Plan:  Primary hypertension Assessment & Plan: Controlled Stable on lisinopril-hydrochlorothiazide 20-25 milligrams daily Asymptomatic in the clinic Low-sodium diet and increase his activity encouraged BP Readings from Last 3 Encounters:  06/25/22 127/77  05/31/22 (!) 162/81  03/26/22 122/80      Age related osteoporosis, unspecified pathological fracture presence Assessment & Plan: Refill Fosamax 70 mg once weekly No recent fractures or falls reported  Orders: -     Alendronate Sodium; TAKE 1 TABLET BY MOUTH 1 TIME A WEEK  Dispense: 12 tablet; Refill: 1  Other hyperlipidemia Assessment & Plan: Stable on simvastatin 20 mg daily No complaints or concerns reported Pending lipid panel Lab Results  Component Value Date   CHOL 148 03/31/2022   HDL 58 03/31/2022   LDLCALC 77 03/31/2022   TRIG 65 03/31/2022   CHOLHDL 2.6 03/31/2022     Orders: -     Lipid panel -     CMP14+EGFR -     CBC with Differential/Platelet  IFG (impaired fasting glucose) -     Hemoglobin A1c  Vitamin D deficiency -     VITAMIN D 25 Hydroxy (Vit-D Deficiency, Fractures)  Other specified hypothyroidism -     TSH + free T4    Follow-up: Return in about 4 months (around 10/26/2022).   Gilmore Laroche, FNP

## 2022-06-25 NOTE — Assessment & Plan Note (Signed)
Refill Fosamax 70 mg once weekly No recent fractures or falls reported

## 2022-06-25 NOTE — Patient Instructions (Addendum)
I appreciate the opportunity to provide care to you today!    Follow up:  4 months  Labs: please stop by the lab during the week  to get your blood drawn (CBC, CMP, TSH, Lipid profile, HgA1c, Vit D)  Please pick up your refill at the pharmacy   Please continue to a heart-healthy diet and increase your physical activities. Try to exercise for at least five days a week.      It was a pleasure to see you and I look forward to continuing to work together on your health and well-being. Please do not hesitate to call the office if you need care or have questions about your care.   Have a wonderful day and week. With Gratitude, Gilmore Laroche MSN, FNP-BC

## 2022-06-29 LAB — SIGNATERA
SIGNATERA MTM READOUT: 0 MTM/ml
SIGNATERA TEST RESULT: NEGATIVE

## 2022-07-01 DIAGNOSIS — E038 Other specified hypothyroidism: Secondary | ICD-10-CM | POA: Diagnosis not present

## 2022-07-01 DIAGNOSIS — E7849 Other hyperlipidemia: Secondary | ICD-10-CM | POA: Diagnosis not present

## 2022-07-01 DIAGNOSIS — R7301 Impaired fasting glucose: Secondary | ICD-10-CM | POA: Diagnosis not present

## 2022-07-01 DIAGNOSIS — E559 Vitamin D deficiency, unspecified: Secondary | ICD-10-CM | POA: Diagnosis not present

## 2022-07-02 LAB — CMP14+EGFR
ALT: 10 IU/L (ref 0–32)
AST: 19 IU/L (ref 0–40)
Albumin/Globulin Ratio: 1.6 (ref 1.2–2.2)
Albumin: 4.2 g/dL (ref 3.8–4.8)
Alkaline Phosphatase: 43 IU/L — ABNORMAL LOW (ref 44–121)
BUN/Creatinine Ratio: 22 (ref 12–28)
BUN: 20 mg/dL (ref 8–27)
Bilirubin Total: 0.6 mg/dL (ref 0.0–1.2)
CO2: 22 mmol/L (ref 20–29)
Calcium: 9.7 mg/dL (ref 8.7–10.3)
Chloride: 103 mmol/L (ref 96–106)
Creatinine, Ser: 0.93 mg/dL (ref 0.57–1.00)
Globulin, Total: 2.7 g/dL (ref 1.5–4.5)
Glucose: 89 mg/dL (ref 70–99)
Potassium: 4.4 mmol/L (ref 3.5–5.2)
Sodium: 140 mmol/L (ref 134–144)
Total Protein: 6.9 g/dL (ref 6.0–8.5)
eGFR: 65 mL/min/{1.73_m2} (ref >59)

## 2022-07-02 LAB — CBC WITH DIFFERENTIAL/PLATELET
Basophils Absolute: 0.1 10*3/uL (ref 0.0–0.2)
Basos: 2 %
EOS (ABSOLUTE): 0.1 10*3/uL (ref 0.0–0.4)
Eos: 1 %
Hematocrit: 36.5 % (ref 34.0–46.6)
Hemoglobin: 12.4 g/dL (ref 11.1–15.9)
Immature Grans (Abs): 0 10*3/uL (ref 0.0–0.1)
Immature Granulocytes: 0 %
Lymphocytes Absolute: 2.6 10*3/uL (ref 0.7–3.1)
Lymphs: 49 %
MCH: 30.8 pg (ref 26.6–33.0)
MCHC: 34 g/dL (ref 31.5–35.7)
MCV: 91 fL (ref 79–97)
Monocytes Absolute: 0.7 10*3/uL (ref 0.1–0.9)
Monocytes: 12 %
Neutrophils Absolute: 1.9 10*3/uL (ref 1.4–7.0)
Neutrophils: 36 %
Platelets: 266 10*3/uL (ref 150–450)
RBC: 4.02 x10E6/uL (ref 3.77–5.28)
RDW: 13.4 % (ref 11.7–15.4)
WBC: 5.4 10*3/uL (ref 3.4–10.8)

## 2022-07-02 LAB — TSH+FREE T4
Free T4: 1.07 ng/dL (ref 0.82–1.77)
TSH: 1.78 u[IU]/mL (ref 0.450–4.500)

## 2022-07-02 LAB — LIPID PANEL
Chol/HDL Ratio: 3 ratio (ref 0.0–4.4)
Cholesterol, Total: 160 mg/dL (ref 100–199)
HDL: 54 mg/dL (ref >39)
LDL Chol Calc (NIH): 92 mg/dL (ref 0–99)
Triglycerides: 72 mg/dL (ref 0–149)
VLDL Cholesterol Cal: 14 mg/dL (ref 5–40)

## 2022-07-02 LAB — HEMOGLOBIN A1C
Est. average glucose Bld gHb Est-mCnc: 128 mg/dL
Hgb A1c MFr Bld: 6.1 % — ABNORMAL HIGH (ref 4.8–5.6)

## 2022-07-02 LAB — VITAMIN D 25 HYDROXY (VIT D DEFICIENCY, FRACTURES): Vit D, 25-Hydroxy: 32.8 ng/mL (ref 30.0–100.0)

## 2022-07-02 NOTE — Progress Notes (Signed)
Please inform the patient that her hemoglobin A1c is 6.1, this indicate that she is prediabetic. I recommend avoiding simple carbohydrates, including cakes, sweet desserts, ice cream, soda (diet or regular), sweet tea, candies, chips, cookies, store-bought juices, alcohol in excess of 1-2 drinks a day, lemonade, artificial sweeteners, donuts, coffee creamers, and sugar-free products.  I recommend avoiding greasy, fatty foods with increased physical activity.   All other labs are stable

## 2022-07-08 ENCOUNTER — Telehealth: Payer: Self-pay

## 2022-07-14 ENCOUNTER — Other Ambulatory Visit: Payer: Self-pay

## 2022-07-14 DIAGNOSIS — M810 Age-related osteoporosis without current pathological fracture: Secondary | ICD-10-CM

## 2022-07-14 MED ORDER — ALENDRONATE SODIUM 70 MG PO TABS: ORAL_TABLET | ORAL | 1 refills | Status: DC

## 2022-07-14 MED ORDER — LISINOPRIL-HYDROCHLOROTHIAZIDE 20-25 MG PO TABS: 1.0000 | ORAL_TABLET | Freq: Every day | ORAL | 1 refills | Status: DC

## 2022-07-15 NOTE — Telephone Encounter (Signed)
Called pt per MD to advise [facility] testing was negative/not detected. Pt verbalized understanding of results and knows [facility] will be in touch to schedule 3 mo repeat lab.   

## 2022-08-12 IMAGING — DX DG CHEST 1V PORT
1 series · 1 of 1 positions shown · non-contrast
Comparison: 09/14/2019

CLINICAL DATA: Port-A-Cath placement

EXAM:
PORTABLE CHEST 1 VIEW

[chest ap]
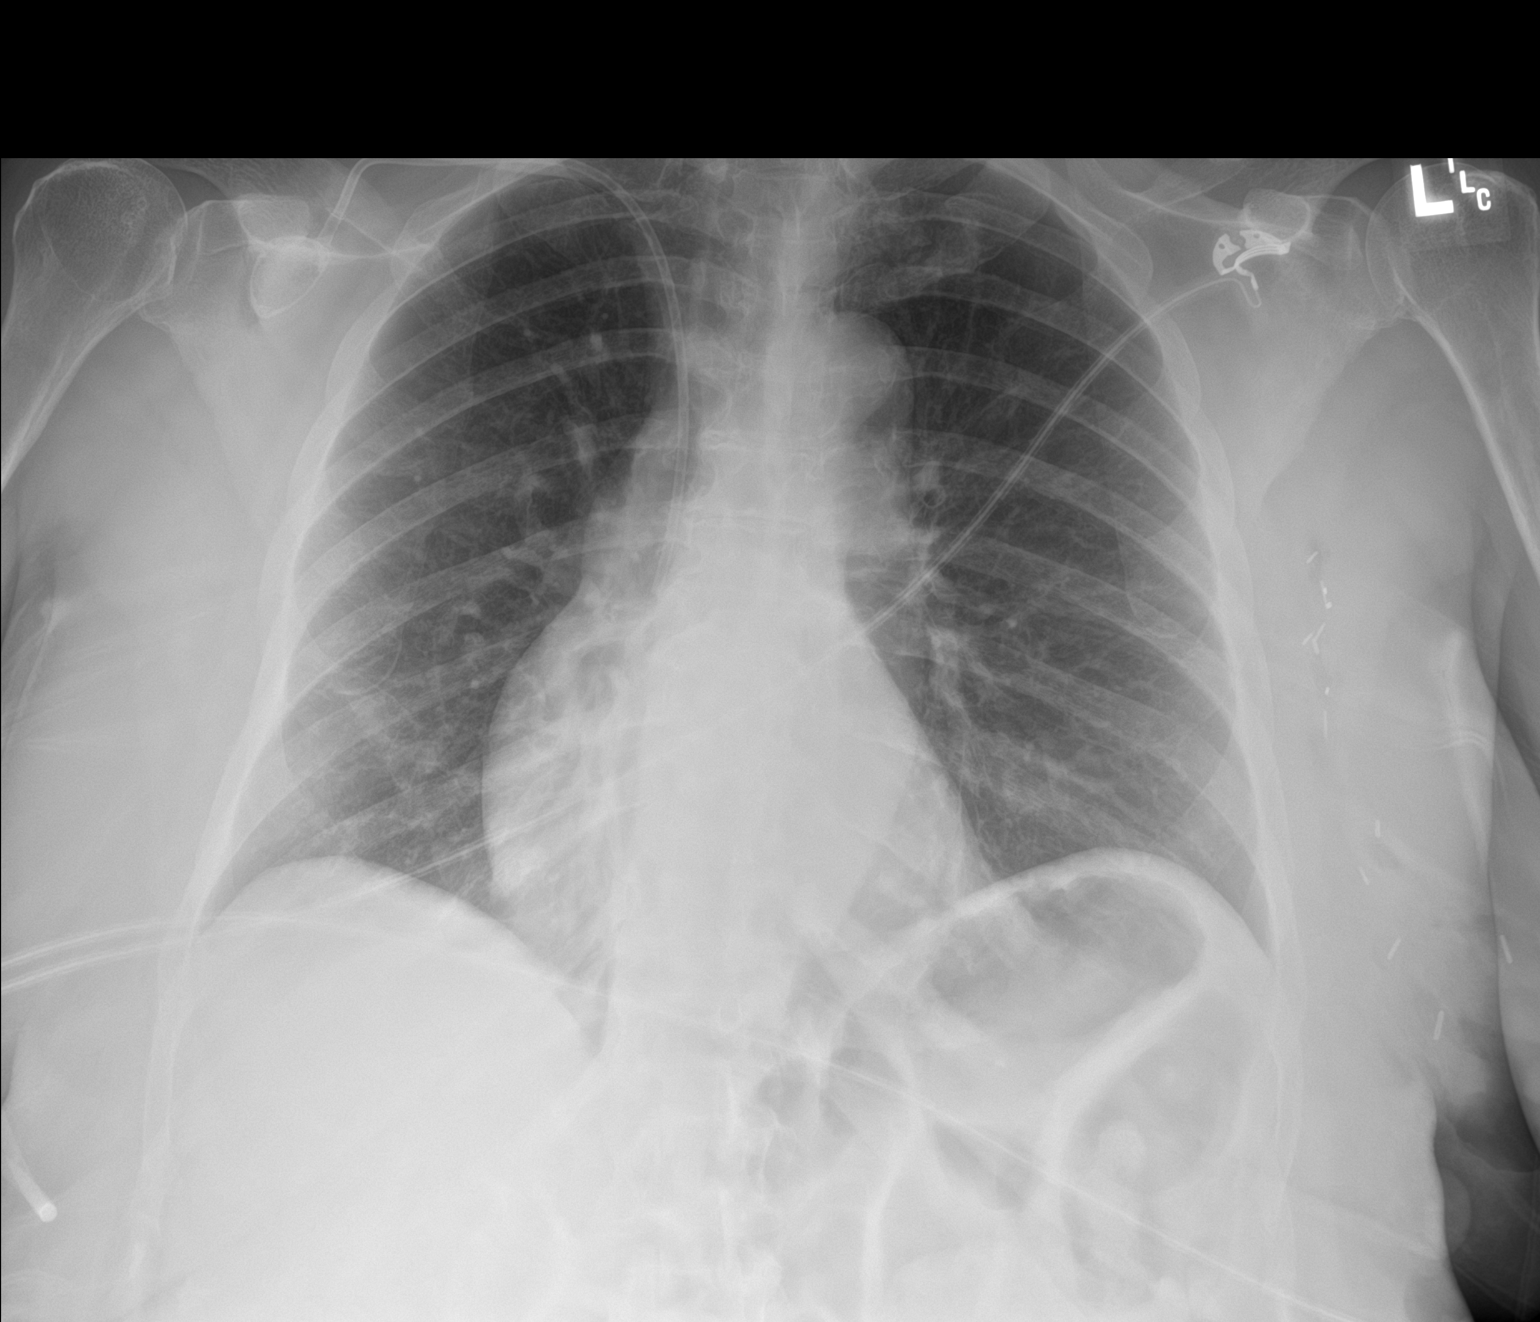

[1 of 1 positions shown; findings below may reference images not displayed]

FINDINGS: Right subclavian Port-A-Cath is in place with the tip in the mid
SVC. No pneumothorax.

Mild bibasilar airspace disease likely atelectasis. Negative for
heart failure. Negative for pleural effusion.
IMPRESSION: Port-A-Cath tip in the SVC.  No pneumothorax

Mild bibasilar atelectasis.

## 2022-08-24 ENCOUNTER — Other Ambulatory Visit: Payer: Self-pay | Admitting: Family Medicine

## 2022-08-24 DIAGNOSIS — E785 Hyperlipidemia, unspecified: Secondary | ICD-10-CM

## 2022-08-27 DIAGNOSIS — M8588 Other specified disorders of bone density and structure, other site: Secondary | ICD-10-CM | POA: Diagnosis not present

## 2022-08-27 DIAGNOSIS — Z853 Personal history of malignant neoplasm of breast: Secondary | ICD-10-CM | POA: Diagnosis not present

## 2022-08-27 LAB — HM DEXA SCAN

## 2022-08-31 NOTE — Progress Notes (Signed)
Patient Care Team: Sheri Laroche, FNP as PCP - General (Family Medicine) Sheri Miner, MD as Consulting Physician (General Surgery) Sheri Croissant, MD as Consulting Physician (Hematology and Oncology) Sheri Puffer, MD as Consulting Physician (Radiation Oncology)  DIAGNOSIS: No diagnosis found.  SUMMARY OF ONCOLOGIC HISTORY: Oncology History  Malignant neoplasm of upper-outer quadrant of left breast in female, estrogen receptor negative (HCC)  05/24/2021 Initial Diagnosis   Screening mammogram detected left UOQ mass 1.5 cm at 2 o'clock position, axilla negative, ultrasound biopsy: Grade 3 IDC ER 0%, PR 0%, HER2 equivocal, FISH pending, Ki-67 40%   05/29/2021 Cancer Staging   Staging form: Breast, AJCC 8th Edition - Clinical: Stage IB (cT1c, cN0, cM0, G3, ER-, PR-, HER2: Equivocal) - Signed by Sheri Croissant, MD on 05/29/2021 Stage prefix: Initial diagnosis Histologic grading system: 3 grade system   06/10/2021 Genetic Testing   Negative hereditary cancer genetic testing: no pathogenic variants detected in [facility] CustomNext-Cancer +RNAinsight Panel.  Report date is Jun 10, 2021.   The CustomNext-Cancer+RNAinsight panel offered by [employer] includes sequencing and rearrangement analysis for the following 47 genes:  APC, ATM, AXIN2, BARD1, BMPR1A, BRCA1, BRCA2, BRIP1, CDH1, CDK4, CDKN2A, CHEK2, DICER1, EPCAM, GREM1, HOXB13, MEN1, MLH1, MSH2, MSH3, MSH6, MUTYH, NBN, NF1, NF2, NTHL1, PALB2, PMS2, POLD1, POLE, PTEN, RAD51C, RAD51D, RECQL, RET, SDHA, SDHAF2, SDHB, SDHC, SDHD, SMAD4, SMARCA4, STK11, TP53, TSC1, TSC2, and VHL.  RNA data is routinely analyzed for use in variant interpretation for all genes.   06/26/2021 Surgery   Left Lumpectomy: Grade 3 IDC 1.5 cm, margins Neg, 0/2 LN Neg, ER 0%, PR 0%, Her 2 Neg, Ki 67: 40%   07/29/2021 - 10/03/2021 Chemotherapy   Patient is on Treatment Plan : BREAST TC q21d     11/05/2021 - 12/02/2021 Radiation Therapy   Adj XRT (Dr.Morrison in [place]) - Left  breast  4272 cGy in 267 cGy daily fractions "Boost": 1000 cGy in 250 cGy daily fractions     CHIEF COMPLIANT:   INTERVAL HISTORY: Sheri Fleming is a   ALLERGIES:  is allergic to taxotere [docetaxel].  MEDICATIONS:  Current Outpatient Medications  Medication Sig Dispense Refill   acetaminophen (TYLENOL) 500 MG tablet Take 500 mg by mouth every 6 (six) hours as needed for mild pain.     alendronate (FOSAMAX) 70 MG tablet TAKE 1 TABLET BY MOUTH 1 TIME A WEEK 12 tablet 1   lisinopril-hydrochlorothiazide (ZESTORETIC) 20-25 MG tablet Take 1 tablet by mouth daily. 90 tablet 1   methocarbamol (ROBAXIN) 500 MG tablet Take 1 tablet (500 mg total) by mouth 2 (two) times daily as needed for muscle spasms. 20 tablet 0   montelukast (SINGULAIR) 10 MG tablet TAKE 1 TABLET BY MOUTH AT  BEDTIME 100 tablet 2   naproxen (NAPROSYN) 500 MG tablet Take 1 tablet (500 mg total) by mouth 2 (two) times daily with a meal. 30 tablet 0   oxyCODONE (ROXICODONE) 5 MG immediate release tablet Take 1 tablet (5 mg total) by mouth every 6 (six) hours as needed for severe pain. 5 tablet 0   simvastatin (ZOCOR) 20 MG tablet TAKE 1 TABLET BY MOUTH IN THE  EVENING 100 tablet 2   No current facility-administered medications for this visit.    PHYSICAL EXAMINATION: ECOG PERFORMANCE STATUS: {CHL ONC ECOG PS:[phone]}  There were no vitals filed for this visit. There were no vitals filed for this visit.  BREAST:*** No palpable masses or nodules in either right or left breasts. No palpable axillary  supraclavicular or infraclavicular adenopathy no breast tenderness or nipple discharge. (exam performed in the presence of a chaperone)  LABORATORY DATA:  I have reviewed the data as listed    Latest Ref Rng & Units 07/01/2022    8:25 AM 03/31/2022    8:31 AM 12/18/2021   12:25 PM  CMP  Glucose 70 - 99 mg/dL 89  82  69   BUN 8 - 27 mg/dL 20  15  17    Creatinine 0.57 - 1.00 mg/dL 1.30  8.65  7.84   Sodium 134 - 144  mmol/L 140  140  140   Potassium 3.5 - 5.2 mmol/L 4.4  3.7  4.2   Chloride 96 - 106 mmol/L 103  100  103   CO2 20 - 29 mmol/L 22  22  24    Calcium 8.7 - 10.3 mg/dL 9.7  9.5  9.5   Total Protein 6.0 - 8.5 g/dL 6.9  7.1    Total Bilirubin 0.0 - 1.2 mg/dL 0.6  0.6    Alkaline Phos 44 - 121 IU/L 43  41    AST 0 - 40 IU/L 19  22    ALT 0 - 32 IU/L 10  13      Lab Results  Component Value Date   WBC 5.4 07/01/2022   HGB 12.4 07/01/2022   HCT 36.5 07/01/2022   MCV 91 07/01/2022   PLT 266 07/01/2022   NEUTROABS 1.9 07/01/2022    ASSESSMENT & PLAN:  No problem-specific Assessment & Plan notes found for this encounter.    No orders of the defined types were placed in this encounter.  The patient has a good understanding of the overall plan. she agrees with it. she will call with any problems that may develop before the next visit here. Total time spent: 30 mins including face to face time and time spent for planning, charting and co-ordination of care   Sheri Fleming, CMA 08/31/22    I Sheri Fleming am acting as a Neurosurgeon for [employer]  ***

## 2022-09-03 ENCOUNTER — Inpatient Hospital Stay: Payer: 59 | Attending: Hematology and Oncology | Admitting: Hematology and Oncology

## 2022-09-03 ENCOUNTER — Other Ambulatory Visit: Payer: Self-pay

## 2022-09-03 VITALS — BP 143/71 | HR 87 | Temp 97.3°F | Resp 18 | Ht 62.0 in | Wt 161.1 lb

## 2022-09-03 DIAGNOSIS — Z923 Personal history of irradiation: Secondary | ICD-10-CM | POA: Insufficient documentation

## 2022-09-03 DIAGNOSIS — Z171 Estrogen receptor negative status [ER-]: Secondary | ICD-10-CM

## 2022-09-03 DIAGNOSIS — Z9221 Personal history of antineoplastic chemotherapy: Secondary | ICD-10-CM | POA: Diagnosis not present

## 2022-09-03 DIAGNOSIS — C50412 Malignant neoplasm of upper-outer quadrant of left female breast: Secondary | ICD-10-CM

## 2022-09-03 DIAGNOSIS — Z853 Personal history of malignant neoplasm of breast: Secondary | ICD-10-CM | POA: Diagnosis not present

## 2022-09-03 NOTE — Assessment & Plan Note (Signed)
05/24/2021:Screening mammogram detected left UOQ mass 1.5 cm at 2 o'clock position, axilla negative, ultrasound biopsy: Grade 3 IDC ER 0%, PR 0%, HER2 equivocal, FISH pending, Ki-67 40% 06/26/21: Left Lumpectomy: Grade 3 IDC 1.5 cm, margins Neg, 0/2 LN Neg, ER 0%, PR 0%, Her 2 Neg, Ki 67: 40%   Treatment Plan: 1. adjuvant chemotherapy with Taxotere and Cytoxan every 3 weeks x4 completed 07/29/2021 2.  Adjuvant radiation completed 12/02/2021 ---------------------------------------------------------------------------------------------------------------- Breast cancer surveillance: Breast exam 09/03/2022: Benign Mammogram 08/15/2022: [facility]: Benign breast density category B  Return to clinic in 1 year for follow-up

## 2022-09-17 ENCOUNTER — Other Ambulatory Visit: Payer: Self-pay | Admitting: Family Medicine

## 2022-10-27 ENCOUNTER — Ambulatory Visit (INDEPENDENT_AMBULATORY_CARE_PROVIDER_SITE_OTHER): Payer: 59 | Admitting: Family Medicine

## 2022-10-27 ENCOUNTER — Encounter: Payer: Self-pay | Admitting: Family Medicine

## 2022-10-27 VITALS — BP 138/82 | HR 86 | Ht 62.0 in | Wt 159.0 lb

## 2022-10-27 DIAGNOSIS — E559 Vitamin D deficiency, unspecified: Secondary | ICD-10-CM | POA: Diagnosis not present

## 2022-10-27 DIAGNOSIS — Z23 Encounter for immunization: Secondary | ICD-10-CM

## 2022-10-27 DIAGNOSIS — E7849 Other hyperlipidemia: Secondary | ICD-10-CM

## 2022-10-27 DIAGNOSIS — E038 Other specified hypothyroidism: Secondary | ICD-10-CM

## 2022-10-27 DIAGNOSIS — R7303 Prediabetes: Secondary | ICD-10-CM | POA: Diagnosis not present

## 2022-10-27 DIAGNOSIS — R252 Cramp and spasm: Secondary | ICD-10-CM | POA: Diagnosis not present

## 2022-10-27 DIAGNOSIS — I10 Essential (primary) hypertension: Secondary | ICD-10-CM

## 2022-10-27 DIAGNOSIS — R7301 Impaired fasting glucose: Secondary | ICD-10-CM

## 2022-10-27 NOTE — Progress Notes (Signed)
Established Patient Office Visit  Subjective:  Patient ID: Sheri Fleming, female    DOB: 04-23-1950  Age: 72 y.o. MRN: [id number]  CC:  Chief Complaint  Patient presents with   Care Management    Follow up visit    HPI Sheri Fleming is a 72 y.o. female with past medical history of primary hypertension and hyperlipidemia presents for f/u of  chronic medical conditions. For the details of today's visit, please refer to the assessment and plan.     Past Medical History:  Diagnosis Date   Arthritis    Asthma    Cancer (HCC) 05/22/2021   breast cancer   High cholesterol    Hypertension    Port-A-Cath in place 08/12/2021   Sleep apnea     Past Surgical History:  Procedure Laterality Date   ABDOMINAL HYSTERECTOMY     BREAST LUMPECTOMY WITH RADIOACTIVE SEED AND SENTINEL LYMPH NODE BIOPSY Left 06/26/2021   Procedure: LEFT BREAST LUMPECTOMY WITH RADIOACTIVE SEED AND SENTINEL LYMPH NODE BIOPSY;  Surgeon: Griselda Miner, MD;  Location: [facility] OR;  Service: General;  Laterality: Left;  GEN & PEC BLOCK   FRACTURE SURGERY Left    left lower leg   PORT-A-CATH REMOVAL Right 12/20/2021   Procedure: REMOVAL PORT-A-CATH;  Surgeon: Griselda Miner, MD;  Location: [facility];  Service: General;  Laterality: Right;   PORTACATH PLACEMENT N/A 06/26/2021   Procedure: PORT PLACEMENT;  Surgeon: Griselda Miner, MD;  Location: [facility];  Service: General;  Laterality: N/A;    Family History  Problem Relation Age of Onset   Stroke Mother    Hypertension Mother    Lung cancer Father        d. 38s   Vaginal cancer Sister 10   Cancer Brother        unknown type; dx 20s; mets   Hypertension Son    Stroke Son    Kidney failure Son    Hypercholesterolemia Son    Stroke Other    Colon cancer Neg Hx    Breast cancer Neg Hx     Social History   Socioeconomic History   Marital status: Divorced    Spouse name: Not on file   Number of children: 4   Years of education: Not on file    Highest education level: Not on file  Occupational History   Not on file  Tobacco Use   Smoking status: Never   Smokeless tobacco: Never  Vaping Use   Vaping status: Never Used  Substance and Sexual Activity   Alcohol use: No   Drug use: No   Sexual activity: Not Currently  Other Topics Concern   Not on file  Social History Narrative   Lives with her ex husband. Has 4 children. Retired.    Social Determinants of Health   Financial Resource Strain: Low Risk  (12/17/2021)   Overall Financial Resource Strain (CARDIA)    Difficulty of Paying Living Expenses: Not hard at all  Food Insecurity: No Food Insecurity (06/20/2022)   Hunger Vital Sign    Worried About Running Out of Food in the Last Year: Never true    Ran Out of Food in the Last Year: Never true  Transportation Needs: No Transportation Needs (06/20/2022)   PRAPARE - Administrator, Civil Service (Medical): No    Lack of Transportation (Non-Medical): No  Physical Activity: Not on file  Stress: No Stress Concern Present (12/17/2021)  [employer] of Occupational Health - Occupational Stress Questionnaire    Feeling of Stress : Not at all  Social Connections: Not on file  Intimate Partner Violence: Not on file    Outpatient Medications Prior to Visit  Medication Sig Dispense Refill   acetaminophen (TYLENOL) 500 MG tablet Take 500 mg by mouth every 6 (six) hours as needed for mild pain.     alendronate (FOSAMAX) 70 MG tablet TAKE 1 TABLET BY MOUTH 1 TIME A WEEK 12 tablet 1   lisinopril-hydrochlorothiazide (ZESTORETIC) 20-25 MG tablet TAKE 1 TABLET BY MOUTH DAILY 100 tablet 2   methocarbamol (ROBAXIN) 500 MG tablet Take 1 tablet (500 mg total) by mouth 2 (two) times daily as needed for muscle spasms. 20 tablet 0   montelukast (SINGULAIR) 10 MG tablet TAKE 1 TABLET BY MOUTH AT  BEDTIME 100 tablet 2   naproxen (NAPROSYN) 500 MG tablet Take 1 tablet (500 mg total) by mouth 2 (two) times daily with a meal. 30  tablet 0   oxyCODONE (ROXICODONE) 5 MG immediate release tablet Take 1 tablet (5 mg total) by mouth every 6 (six) hours as needed for severe pain. 5 tablet 0   simvastatin (ZOCOR) 20 MG tablet TAKE 1 TABLET BY MOUTH IN THE  EVENING 100 tablet 2   No facility-administered medications prior to visit.    Allergies  Allergen Reactions   Taxotere [Docetaxel] Other (See Comments)    Pt had hypersensitivity rxn to Docetaxel. See progress note from 07/29/21 at 1419. Infusion discontinued.     ROS Review of Systems  Constitutional:  Negative for chills and fever.  Eyes:  Negative for visual disturbance.  Respiratory:  Negative for chest tightness and shortness of breath.   Neurological:  Negative for dizziness and headaches.      Objective:    Physical Exam HENT:     Head: Normocephalic.     Mouth/Throat:     Mouth: Mucous membranes are moist.  Cardiovascular:     Rate and Rhythm: Normal rate.     Heart sounds: Normal heart sounds.  Pulmonary:     Effort: Pulmonary effort is normal.     Breath sounds: Normal breath sounds.  Neurological:     Mental Status: She is alert.     BP 138/82   Pulse 86   Ht 5\' 2"  (1.575 m)   Wt 159 lb 0.6 oz (72.1 kg)   SpO2 96%   BMI 29.09 kg/m  Wt Readings from Last 3 Encounters:  10/27/22 159 lb 0.6 oz (72.1 kg)  09/03/22 161 lb 1.6 oz (73.1 kg)  06/25/22 161 lb 1.3 oz (73.1 kg)    Lab Results  Component Value Date   TSH 1.780 07/01/2022   Lab Results  Component Value Date   WBC 5.4 07/01/2022   HGB 12.4 07/01/2022   HCT 36.5 07/01/2022   MCV 91 07/01/2022   PLT 266 07/01/2022   Lab Results  Component Value Date   NA 140 07/01/2022   K 4.4 07/01/2022   CO2 22 07/01/2022   GLUCOSE 89 07/01/2022   BUN 20 07/01/2022   CREATININE 0.93 07/01/2022   BILITOT 0.6 07/01/2022   ALKPHOS 43 (L) 07/01/2022   AST 19 07/01/2022   ALT 10 07/01/2022   PROT 6.9 07/01/2022   ALBUMIN 4.2 07/01/2022   CALCIUM 9.7 07/01/2022   ANIONGAP 13  12/18/2021   EGFR 65 07/01/2022   Lab Results  Component Value Date   CHOL 160 07/01/2022  Lab Results  Component Value Date   HDL 54 07/01/2022   Lab Results  Component Value Date   LDLCALC 92 07/01/2022   Lab Results  Component Value Date   TRIG 72 07/01/2022   Lab Results  Component Value Date   CHOLHDL 3.0 07/01/2022   Lab Results  Component Value Date   HGBA1C 6.1 (H) 07/01/2022      Assessment & Plan:  Primary hypertension Assessment & Plan: Controlled Stable on lisinopril-hydrochlorothiazide 20-25 milligrams daily Asymptomatic in the clinic Low-sodium diet and increase his activity encouraged BP Readings from Last 3 Encounters:  10/27/22 138/82  09/03/22 (!) 143/71  06/25/22 127/77      Prediabetes Assessment & Plan: For managing prediabetes, I recommend the following lifestyle changes:  Reduce Intake of High-Sugar Foods and Beverages: Limit foods and drinks high in sugar to help regulate blood sugar levels. Increase Consumption of Nutrient-Rich Foods: Focus on incorporating more fruits, vegetables, and whole grains into your diet. Choose Lean Proteins: Opt for lean proteins such as chicken, fish, beans, and legumes. Select Low-Fat Dairy Products: Choose low-fat or non-fat dairy options. Minimize Saturated Fats, Trans Fats, and Cholesterol: Reduce intake of foods high in saturated fats, trans fatty acids, and cholesterol. Engage in Regular Physical Activity: Aim for at least 30 minutes of brisk walking or other moderate activity at least 5 days a week.    Other hyperlipidemia Assessment & Plan: The patient is encouraged to continue taking simvastatin 20 mg daily. Additionally, I recommend decreasing her intake of greasy, starchy, and fatty foods while increasing physical activity to support her overall health. Lab Results  Component Value Date   CHOL 160 07/01/2022   HDL 54 07/01/2022   LDLCALC 92 07/01/2022   TRIG 72 07/01/2022   CHOLHDL 3.0  07/01/2022     Orders: -     Lipid panel -     CMP14+EGFR -     CBC with Differential/Platelet  Leg cramps Assessment & Plan: The patient complains of occasional bilateral leg cramps. She is encouraged to start taking over-the-counter Vitamin B complex, which should contain 50 mg of fursultiamine, 250 micrograms of hydroxocobalamin, 30 mg of pyridoxal phosphate, and 5 mg of riboflavin. Additionally, she is advised to consider drinking tonic water, as it may help alleviate the cramps.   Encounter for immunization -     Flu Vaccine Trivalent High Dose (Fluad)  IFG (impaired fasting glucose) -     Hemoglobin A1c  Vitamin D deficiency -     VITAMIN D 25 Hydroxy (Vit-D Deficiency, Fractures)  Other specified hypothyroidism -     TSH + free T4  Note: This chart has been completed using Engineer, civil (consulting) software, and while attempts have been made to ensure accuracy, certain words and phrases may not be transcribed as intended.    Follow-up: Return in about 4 months (around 02/26/2023).   Gilmore Laroche, FNP

## 2022-10-27 NOTE — Assessment & Plan Note (Signed)
The patient is encouraged to continue taking simvastatin 20 mg daily. Additionally, I recommend decreasing her intake of greasy, starchy, and fatty foods while increasing physical activity to support her overall health. Lab Results  Component Value Date   CHOL 160 07/01/2022   HDL 54 07/01/2022   LDLCALC 92 07/01/2022   TRIG 72 07/01/2022   CHOLHDL 3.0 07/01/2022

## 2022-10-27 NOTE — Assessment & Plan Note (Signed)
The patient complains of occasional bilateral leg cramps. She is encouraged to start taking over-the-counter Vitamin B complex, which should contain 50 mg of fursultiamine, 250 micrograms of hydroxocobalamin, 30 mg of pyridoxal phosphate, and 5 mg of riboflavin. Additionally, she is advised to consider drinking tonic water, as it may help alleviate the cramps.

## 2022-10-27 NOTE — Assessment & Plan Note (Signed)
Controlled Stable on lisinopril-hydrochlorothiazide 20-25 milligrams daily Asymptomatic in the clinic Low-sodium diet and increase his activity encouraged BP Readings from Last 3 Encounters:  10/27/22 138/82  09/03/22 (!) 143/71  06/25/22 127/77

## 2022-10-27 NOTE — Assessment & Plan Note (Signed)
For managing prediabetes, I recommend the following lifestyle changes:  Reduce Intake of High-Sugar Foods and Beverages: Limit foods and drinks high in sugar to help regulate blood sugar levels. Increase Consumption of Nutrient-Rich Foods: Focus on incorporating more fruits, vegetables, and whole grains into your diet. Choose Lean Proteins: Opt for lean proteins such as chicken, fish, beans, and legumes. Select Low-Fat Dairy Products: Choose low-fat or non-fat dairy options. Minimize Saturated Fats, Trans Fats, and Cholesterol: Reduce intake of foods high in saturated fats, trans fatty acids, and cholesterol. Engage in Regular Physical Activity: Aim for at least 30 minutes of brisk walking or other moderate activity at least 5 days a week.

## 2022-10-27 NOTE — Patient Instructions (Addendum)
I appreciate the opportunity to provide care to you today!    Follow up:  4 months  Fasting Labs: please stop by the lab during the week  to get your blood drawn (CBC, CMP, TSH, Lipid profile, HgA1c, Vit D)  Leg cramps Start taking otc Vitamin B complex (containing fursultiamine 50 mg, hydroxocobalamin 250 micrograms, pyridoxal phosphate 30 mg, and riboflavin 5 mg) Drinking tonic water   For managing prediabetes, I recommend the following lifestyle changes:  Reduce Intake of High-Sugar Foods and Beverages: Limit foods and drinks high in sugar to help regulate blood sugar levels. Increase Consumption of Nutrient-Rich Foods: Focus on incorporating more fruits, vegetables, and whole grains into your diet. Choose Lean Proteins: Opt for lean proteins such as chicken, fish, beans, and legumes. Select Low-Fat Dairy Products: Choose low-fat or non-fat dairy options. Minimize Saturated Fats, Trans Fats, and Cholesterol: Reduce intake of foods high in saturated fats, trans fatty acids, and cholesterol. Engage in Regular Physical Activity: Aim for at least 30 minutes of brisk walking or other moderate activity at least 5 days a week.      Please continue to a heart-healthy diet and increase your physical activities. Try to exercise for at least five days a week.    It was a pleasure to see you and I look forward to continuing to work together on your health and well-being. Please do not hesitate to call the office if you need care or have questions about your care.  In case of emergency, please visit the Emergency Department for urgent care, or contact our clinic at [phone] to schedule an appointment. We're here to help you!   Have a wonderful day and week. With Gratitude, Gilmore Laroche MSN, FNP-BC

## 2022-11-03 DIAGNOSIS — R7301 Impaired fasting glucose: Secondary | ICD-10-CM | POA: Diagnosis not present

## 2022-11-03 DIAGNOSIS — E559 Vitamin D deficiency, unspecified: Secondary | ICD-10-CM | POA: Diagnosis not present

## 2022-11-03 DIAGNOSIS — E7849 Other hyperlipidemia: Secondary | ICD-10-CM | POA: Diagnosis not present

## 2022-11-03 DIAGNOSIS — E038 Other specified hypothyroidism: Secondary | ICD-10-CM | POA: Diagnosis not present

## 2022-11-04 LAB — CBC WITH DIFFERENTIAL/PLATELET
Basophils Absolute: 0.1 10*3/uL (ref 0.0–0.2)
Basos: 3 %
EOS (ABSOLUTE): 0.1 10*3/uL (ref 0.0–0.4)
Eos: 1 %
Hematocrit: 34 % (ref 34.0–46.6)
Hemoglobin: 11.4 g/dL (ref 11.1–15.9)
Immature Grans (Abs): 0 10*3/uL (ref 0.0–0.1)
Immature Granulocytes: 0 %
Lymphocytes Absolute: 2.5 10*3/uL (ref 0.7–3.1)
Lymphs: 52 %
MCH: 32.3 pg (ref 26.6–33.0)
MCHC: 33.5 g/dL (ref 31.5–35.7)
MCV: 96 fL (ref 79–97)
Monocytes Absolute: 0.6 10*3/uL (ref 0.1–0.9)
Monocytes: 11 %
Neutrophils Absolute: 1.6 10*3/uL (ref 1.4–7.0)
Neutrophils: 33 %
Platelets: 302 10*3/uL (ref 150–450)
RBC: 3.53 x10E6/uL — ABNORMAL LOW (ref 3.77–5.28)
RDW: 13.6 % (ref 11.7–15.4)
WBC: 4.9 10*3/uL (ref 3.4–10.8)

## 2022-11-04 LAB — TSH+FREE T4
Free T4: 1.11 ng/dL (ref 0.82–1.77)
TSH: 1.47 u[IU]/mL (ref 0.450–4.500)

## 2022-11-04 LAB — LIPID PANEL
Chol/HDL Ratio: 2.8 {ratio} (ref 0.0–4.4)
Cholesterol, Total: 143 mg/dL (ref 100–199)
HDL: 52 mg/dL (ref >39)
LDL Chol Calc (NIH): 75 mg/dL (ref 0–99)
Triglycerides: 85 mg/dL (ref 0–149)
VLDL Cholesterol Cal: 16 mg/dL (ref 5–40)

## 2022-11-04 LAB — CMP14+EGFR
ALT: 13 [IU]/L (ref 0–32)
AST: 22 [IU]/L (ref 0–40)
Albumin: 4.5 g/dL (ref 3.8–4.8)
Alkaline Phosphatase: 40 [IU]/L — ABNORMAL LOW (ref 44–121)
BUN/Creatinine Ratio: 16 (ref 12–28)
BUN: 15 mg/dL (ref 8–27)
Bilirubin Total: 0.8 mg/dL (ref 0.0–1.2)
CO2: 26 mmol/L (ref 20–29)
Calcium: 9.7 mg/dL (ref 8.7–10.3)
Chloride: 102 mmol/L (ref 96–106)
Creatinine, Ser: 0.93 mg/dL (ref 0.57–1.00)
Globulin, Total: 2.2 g/dL (ref 1.5–4.5)
Glucose: 85 mg/dL (ref 70–99)
Potassium: 4.5 mmol/L (ref 3.5–5.2)
Sodium: 139 mmol/L (ref 134–144)
Total Protein: 6.7 g/dL (ref 6.0–8.5)
eGFR: 65 mL/min/{1.73_m2} (ref >59)

## 2022-11-04 LAB — VITAMIN D 25 HYDROXY (VIT D DEFICIENCY, FRACTURES): Vit D, 25-Hydroxy: 32.9 ng/mL (ref 30.0–100.0)

## 2022-11-04 LAB — HEMOGLOBIN A1C
Est. average glucose Bld gHb Est-mCnc: 126 mg/dL
Hgb A1c MFr Bld: 6 % — ABNORMAL HIGH (ref 4.8–5.6)

## 2022-11-04 NOTE — Progress Notes (Signed)
Your lab results indicate that you are prediabetic. I recommend decreasing your intake of high-sugar foods and beverages and increasing your physical activity to help manage your blood sugar levels. All other labs are stable.

## 2022-12-19 ENCOUNTER — Telehealth: Payer: Self-pay

## 2022-12-19 LAB — SIGNATERA
SIGNATERA MTM READOUT: 0 MTM/ml
SIGNATERA TEST RESULT: NEGATIVE

## 2022-12-19 NOTE — Telephone Encounter (Signed)
Called pt per MD to advise [employer] testing was negative/not detected. Pt verbalized understanding of results and knows [facility] will be in touch to schedule 6 mo repeat lab.

## 2022-12-30 ENCOUNTER — Other Ambulatory Visit: Payer: Self-pay | Admitting: Family Medicine

## 2022-12-30 DIAGNOSIS — M810 Age-related osteoporosis without current pathological fracture: Secondary | ICD-10-CM

## 2023-01-13 DIAGNOSIS — C50412 Malignant neoplasm of upper-outer quadrant of left female breast: Secondary | ICD-10-CM | POA: Diagnosis not present

## 2023-01-13 DIAGNOSIS — Z171 Estrogen receptor negative status [ER-]: Secondary | ICD-10-CM | POA: Diagnosis not present

## 2023-02-25 ENCOUNTER — Other Ambulatory Visit: Payer: Self-pay | Admitting: Family Medicine

## 2023-02-25 DIAGNOSIS — J4520 Mild intermittent asthma, uncomplicated: Secondary | ICD-10-CM

## 2023-02-27 ENCOUNTER — Ambulatory Visit: Payer: 59 | Admitting: Family Medicine

## 2023-03-10 ENCOUNTER — Other Ambulatory Visit: Payer: Self-pay | Admitting: *Deleted

## 2023-03-10 DIAGNOSIS — Z171 Estrogen receptor negative status [ER-]: Secondary | ICD-10-CM

## 2023-03-10 NOTE — Progress Notes (Signed)
Renewal orders placed for [employer]ater[employer] testing.

## 2023-03-16 ENCOUNTER — Encounter: Payer: Self-pay | Admitting: Family Medicine

## 2023-03-16 ENCOUNTER — Ambulatory Visit (INDEPENDENT_AMBULATORY_CARE_PROVIDER_SITE_OTHER): Payer: 59 | Admitting: Family Medicine

## 2023-03-16 VITALS — BP 135/82 | HR 89 | Ht 62.0 in | Wt 164.0 lb

## 2023-03-16 DIAGNOSIS — E038 Other specified hypothyroidism: Secondary | ICD-10-CM

## 2023-03-16 DIAGNOSIS — R7303 Prediabetes: Secondary | ICD-10-CM | POA: Diagnosis not present

## 2023-03-16 DIAGNOSIS — I10 Essential (primary) hypertension: Secondary | ICD-10-CM

## 2023-03-16 DIAGNOSIS — R7301 Impaired fasting glucose: Secondary | ICD-10-CM

## 2023-03-16 DIAGNOSIS — M79601 Pain in right arm: Secondary | ICD-10-CM | POA: Diagnosis not present

## 2023-03-16 DIAGNOSIS — E7849 Other hyperlipidemia: Secondary | ICD-10-CM | POA: Diagnosis not present

## 2023-03-16 DIAGNOSIS — E559 Vitamin D deficiency, unspecified: Secondary | ICD-10-CM

## 2023-03-16 DIAGNOSIS — T148XXA Other injury of unspecified body region, initial encounter: Secondary | ICD-10-CM | POA: Insufficient documentation

## 2023-03-16 NOTE — Patient Instructions (Addendum)
I appreciate the opportunity to provide care to you today!    Follow up:  4 months  Labs: please stop by the lab during the week to get your blood drawn (CBC, CMP, TSH, Lipid profile, HgA1c, Vit D)  Screening: HIV and Hep C   Attached with your AVS, you will find valuable resources for self-education. I highly recommend dedicating some time to thoroughly examine them.   Please continue to a heart-healthy diet and increase your physical activities. Try to exercise for at least five days a week.    It was a pleasure to see you and I look forward to continuing to work together on your health and well-being. Please do not hesitate to call the office if you need care or have questions about your care.  In case of emergency, please visit the Emergency Department for urgent care, or contact our clinic at [phone] to schedule an appointment. We're here to help you!   Have a wonderful day and week. With Gratitude, Gilmore Laroche MSN, FNP-BC

## 2023-03-16 NOTE — Assessment & Plan Note (Signed)
 The patient denies pain in the right arm today but reports that the pain usually comes and goes, rating it 4 to 5/10, with no recent injury or trauma. She reports repetitive use of the affected arm. Her symptoms are likely due to overuse, which she acknowledges. I recommend rest, application of heat or cold therapy, and taking over-the-counter Tylenol  as needed for pain relief. She is encouraged to follow up if symptoms worsen or fail to improve. No numbness, tingling, or weakness was reported.

## 2023-03-16 NOTE — Assessment & Plan Note (Signed)
For managing prediabetes, I recommend the following lifestyle changes:  Reduce Intake of High-Sugar Foods and Beverages: Limit foods and drinks high in sugar to help regulate blood sugar levels. Increase Consumption of Nutrient-Rich Foods: Focus on incorporating more fruits, vegetables, and whole grains into your diet. Choose Lean Proteins: Opt for lean proteins such as chicken, fish, beans, and legumes. Select Low-Fat Dairy Products: Choose low-fat or non-fat dairy options. Minimize Saturated Fats, Trans Fats, and Cholesterol: Reduce intake of foods high in saturated fats, trans fatty acids, and cholesterol. Engage in Regular Physical Activity: Aim for at least 30 minutes of brisk walking or other moderate activity at least 5 days a week.

## 2023-03-16 NOTE — Assessment & Plan Note (Signed)
 Controlled Stable on lisinopril -hydrochlorothiazide  20-25 milligrams daily Asymptomatic in the clinic A low-sodium diet of less than 2300 mg daily is recommended, along with increased physical activity of moderate intensity, aiming for 150 minutes weekly. The patient is encouraged to continue with these lifestyle modifications to help manage their blood pressure effectively.  BP Readings from Last 3 Encounters:  03/16/23 135/82  10/27/22 138/82  09/03/22 (!) 143/71

## 2023-03-16 NOTE — Progress Notes (Signed)
 Established Patient Office Visit  Subjective:  Patient ID: Sheri Fleming, female    DOB: 25-May-1950  Age: 73 y.o. MRN: [id number]  CC:  Chief Complaint  Patient presents with   Follow-up    4 mo   Pain in rt. Arm that comes and goes.     HPI Sheri Fleming is a 73 y.o. female with past medical history of hypertension, prediabetes, hyperlipidemia presents for f/u of  chronic medical conditions. For the details of today's visit, please refer to the assessment and plan.     Past Medical History:  Diagnosis Date   Arthritis    Asthma    Cancer (HCC) 05/22/2021   breast cancer   High cholesterol    Hypertension    Port-A-Cath in place 08/12/2021   Sleep apnea     Past Surgical History:  Procedure Laterality Date   ABDOMINAL HYSTERECTOMY     BREAST LUMPECTOMY WITH RADIOACTIVE SEED AND SENTINEL LYMPH NODE BIOPSY Left 06/26/2021   Procedure: LEFT BREAST LUMPECTOMY WITH RADIOACTIVE SEED AND SENTINEL LYMPH NODE BIOPSY;  Surgeon: Caralyn Chandler, MD;  Location: [facility] OR;  Service: General;  Laterality: Left;  GEN & PEC BLOCK   FRACTURE SURGERY Left    left lower leg   PORT-A-CATH REMOVAL Right 12/20/2021   Procedure: REMOVAL PORT-A-CATH;  Surgeon: Caralyn Chandler, MD;  Location: [facility];  Service: General;  Laterality: Right;   PORTACATH PLACEMENT N/A 06/26/2021   Procedure: PORT PLACEMENT;  Surgeon: Caralyn Chandler, MD;  Location: [facility] OR;  Service: General;  Laterality: N/A;    Family History  Problem Relation Age of Onset   Stroke Mother    Hypertension Mother    Lung cancer Father        d. 41s   Vaginal cancer Sister 77   Cancer Brother        unknown type; dx 66s; mets   Hypertension Son    Stroke Son    Kidney failure Son    Hypercholesterolemia Son    Stroke Other    Colon cancer Neg Hx    Breast cancer Neg Hx     Social History   Socioeconomic History   Marital status: Divorced    Spouse name: Not on file   Number of children: 4   Years of  education: Not on file   Highest education level: Not on file  Occupational History   Not on file  Tobacco Use   Smoking status: Never   Smokeless tobacco: Never  Vaping Use   Vaping status: Never Used  Substance and Sexual Activity   Alcohol use: No   Drug use: No   Sexual activity: Not Currently  Other Topics Concern   Not on file  Social History Narrative   Lives with her ex husband. Has 4 children. Retired.    Social Drivers of Corporate investment banker Strain: Low Risk  (12/17/2021)   Overall Financial Resource Strain (CARDIA)    Difficulty of Paying Living Expenses: Not hard at all  Food Insecurity: No Food Insecurity (06/20/2022)   Hunger Vital Sign    Worried About Running Out of Food in the Last Year: Never true    Ran Out of Food in the Last Year: Never true  Transportation Needs: No Transportation Needs (06/20/2022)   PRAPARE - Administrator, Civil Service (Medical): No    Lack of Transportation (Non-Medical): No  Physical Activity: Not on file  Stress: No Stress Concern Present (12/17/2021)   [employer] of Occupational Health - Occupational Stress Questionnaire    Feeling of Stress : Not at all  Social Connections: Not on file  Intimate Partner Violence: Not on file    Outpatient Medications Prior to Visit  Medication Sig Dispense Refill   acetaminophen  (TYLENOL ) 500 MG tablet Take 500 mg by mouth every 6 (six) hours as needed for mild pain.     alendronate  (FOSAMAX ) 70 MG tablet TAKE 1 TABLET BY MOUTH WEEKLY  WITH 8 OZ OF PLAIN WATER 30  MINUTES BEFORE FIRST FOOD, DRINK OR MEDS. STAY UPRIGHT FOR 30  MINS 12 tablet 3   lisinopril -hydrochlorothiazide  (ZESTORETIC ) 20-25 MG tablet TAKE 1 TABLET BY MOUTH DAILY 100 tablet 2   methocarbamol  (ROBAXIN ) 500 MG tablet Take 1 tablet (500 mg total) by mouth 2 (two) times daily as needed for muscle spasms. 20 tablet 0   montelukast  (SINGULAIR ) 10 MG tablet TAKE 1 TABLET BY MOUTH AT  BEDTIME 100 tablet 2    naproxen  (NAPROSYN ) 500 MG tablet Take 1 tablet (500 mg total) by mouth 2 (two) times daily with a meal. 30 tablet 0   oxyCODONE  (ROXICODONE ) 5 MG immediate release tablet Take 1 tablet (5 mg total) by mouth every 6 (six) hours as needed for severe pain. 5 tablet 0   simvastatin  (ZOCOR ) 20 MG tablet TAKE 1 TABLET BY MOUTH IN THE  EVENING 100 tablet 2   No facility-administered medications prior to visit.    Allergies  Allergen Reactions   Taxotere  [Docetaxel ] Other (See Comments)    Pt had hypersensitivity rxn to Docetaxel . See progress note from 07/29/21 at 1419. Infusion discontinued.     ROS Review of Systems  Constitutional:  Negative for chills and fever.  Eyes:  Negative for visual disturbance.  Respiratory:  Negative for chest tightness and shortness of breath.   Neurological:  Negative for dizziness and headaches.      Objective:    Physical Exam HENT:     Head: Normocephalic.     Mouth/Throat:     Mouth: Mucous membranes are moist.  Cardiovascular:     Rate and Rhythm: Normal rate.     Heart sounds: Normal heart sounds.  Pulmonary:     Effort: Pulmonary effort is normal.     Breath sounds: Normal breath sounds.  Neurological:     Mental Status: She is alert.     BP 135/82   Pulse 89   Ht 5\' 2"  (1.575 m)   Wt 164 lb 0.6 oz (74.4 kg)   SpO2 96%   BMI 30.00 kg/m  Wt Readings from Last 3 Encounters:  03/16/23 164 lb 0.6 oz (74.4 kg)  10/27/22 159 lb 0.6 oz (72.1 kg)  09/03/22 161 lb 1.6 oz (73.1 kg)    Lab Results  Component Value Date   TSH 1.470 11/03/2022   Lab Results  Component Value Date   WBC 4.9 11/03/2022   HGB 11.4 11/03/2022   HCT 34.0 11/03/2022   MCV 96 11/03/2022   PLT 302 11/03/2022   Lab Results  Component Value Date   NA 139 11/03/2022   K 4.5 11/03/2022   CO2 26 11/03/2022   GLUCOSE 85 11/03/2022   BUN 15 11/03/2022   CREATININE 0.93 11/03/2022   BILITOT 0.8 11/03/2022   ALKPHOS 40 (L) 11/03/2022   AST 22 11/03/2022    ALT 13 11/03/2022   PROT 6.7 11/03/2022   ALBUMIN 4.5 11/03/2022  CALCIUM 9.7 11/03/2022   ANIONGAP 13 12/18/2021   EGFR 65 11/03/2022   Lab Results  Component Value Date   CHOL 143 11/03/2022   Lab Results  Component Value Date   HDL 52 11/03/2022   Lab Results  Component Value Date   LDLCALC 75 11/03/2022   Lab Results  Component Value Date   TRIG 85 11/03/2022   Lab Results  Component Value Date   CHOLHDL 2.8 11/03/2022   Lab Results  Component Value Date   HGBA1C 6.0 (H) 11/03/2022      Assessment & Plan:  Primary hypertension Assessment & Plan: Controlled Stable on lisinopril -hydrochlorothiazide  20-25 milligrams daily Asymptomatic in the clinic A low-sodium diet of less than 2300 mg daily is recommended, along with increased physical activity of moderate intensity, aiming for 150 minutes weekly. The patient is encouraged to continue with these lifestyle modifications to help manage their blood pressure effectively.  BP Readings from Last 3 Encounters:  03/16/23 135/82  10/27/22 138/82  09/03/22 (!) 143/71      Prediabetes Assessment & Plan: For managing prediabetes, I recommend the following lifestyle changes:  Reduce Intake of High-Sugar Foods and Beverages: Limit foods and drinks high in sugar to help regulate blood sugar levels. Increase Consumption of Nutrient-Rich Foods: Focus on incorporating more fruits, vegetables, and whole grains into your diet. Choose Lean Proteins: Opt for lean proteins such as chicken, fish, beans, and legumes. Select Low-Fat Dairy Products: Choose low-fat or non-fat dairy options. Minimize Saturated Fats, Trans Fats, and Cholesterol: Reduce intake of foods high in saturated fats, trans fatty acids, and cholesterol. Engage in Regular Physical Activity: Aim for at least 30 minutes of brisk walking or other moderate activity at least 5 days a week.    Other hyperlipidemia -     Lipid panel -     CMP14+EGFR -     CBC  with Differential/Platelet  Muscle strain Assessment & Plan: The patient denies pain in the right arm today but reports that the pain usually comes and goes, rating it 4 to 5/10, with no recent injury or trauma. She reports repetitive use of the affected arm. Her symptoms are likely due to overuse, which she acknowledges. I recommend rest, application of heat or cold therapy, and taking over-the-counter Tylenol  as needed for pain relief. She is encouraged to follow up if symptoms worsen or fail to improve. No numbness, tingling, or weakness was reported.    IFG (impaired fasting glucose) -     Hemoglobin A1c  Vitamin D  deficiency -     VITAMIN D  25 Hydroxy (Vit-D Deficiency, Fractures)  TSH (thyroid -stimulating hormone deficiency) -     TSH + free T4  Note: This chart has been completed using Engineer, civil (consulting) software, and while attempts have been made to ensure accuracy, certain words and phrases may not be transcribed as intended.    Follow-up: Return in about 4 months (around 07/14/2023).   Shloima Clinch, FNP

## 2023-03-24 ENCOUNTER — Ambulatory Visit (INDEPENDENT_AMBULATORY_CARE_PROVIDER_SITE_OTHER): Payer: 59

## 2023-03-24 VITALS — BP 135/82 | Ht 62.5 in | Wt 164.0 lb

## 2023-03-24 DIAGNOSIS — Z0000 Encounter for general adult medical examination without abnormal findings: Secondary | ICD-10-CM | POA: Diagnosis not present

## 2023-03-24 DIAGNOSIS — Z1231 Encounter for screening mammogram for malignant neoplasm of breast: Secondary | ICD-10-CM

## 2023-03-24 DIAGNOSIS — Z0100 Encounter for examination of eyes and vision without abnormal findings: Secondary | ICD-10-CM

## 2023-03-24 NOTE — Patient Instructions (Signed)
Sheri Fleming , Thank you for taking time to come for your Medicare Wellness Visit. I appreciate your ongoing commitment to your health goals. Please review the following plan we discussed and let me know if I can assist you in the future.   Referrals/Orders/Follow-Ups/Clinician Recommendations:   Next Medicare Annual Wellness Visit:  March 28, 2024 10:00 am telephone visit.   You have an order for:   [x]   3D Mammogram   Schedule your [place] screening mammogram through MyChart!   Log into your MyChart account.  Go to 'Visit' (or 'Appointments' if on mobile App) --> Schedule an Appointment  Under 'Select a Reason for Visit' choose the Mammogram Screening option.  Complete the pre-visit questions and select the time and place that best fits your schedule.    If lab work has been ordered for you today, you may have these drawn at the same lab you have your routine lab work drawn for your primary care provider.  Labs Ordered: na   This is a list of the screening recommended for you and due dates:  Health Maintenance  Topic Date Due   Pneumonia Vaccine (1 of 2 - PCV) Never done   Zoster (Shingles) Vaccine (1 of 2) Never done   DTaP/Tdap/Td vaccine (2 - Tdap) 06/17/2007   COVID-19 Vaccine (3 - Moderna risk series) 04/01/2023*   Mammogram  05/16/2023   Medicare Annual Wellness Visit  03/23/2024   DEXA scan (bone density measurement)  08/26/2024   Cologuard (Stool DNA test)  10/01/2024   Flu Shot  Completed   Hepatitis C Screening  Completed   HPV Vaccine  Aged Out  *Topic was postponed. The date shown is not the original due date.    Advanced directives: (ACP Link)Information on Advanced Care Planning can be found at [facility] Directives Advance Health Care Directives ([email or web address]/)   Next Medicare Annual Wellness Visit scheduled for next year: yes   Understanding Your Risk for Falls Millions of people have serious injuries from  falls each year. It is important to understand your risk of falling. Talk with your health care provider about your risk and what you can do to lower it. If you do have a serious fall, make sure to tell your provider. Falling once raises your risk of falling again. How can falls affect me? Serious injuries from falls are common. These include: Broken bones, such as hip fractures. Head injuries, such as traumatic brain injuries (TBI) or concussions. A fear of falling can cause you to avoid activities and stay at home. This can make your muscles weaker and raise your risk for a fall. What can increase my risk? There are a number of risk factors that increase your risk for falling. The more risk factors you have, the higher your risk of falling. Serious injuries from a fall happen most often to people who are older than 75 years old. Teenagers and young adults ages 90-29 are also at higher risk. Common risk factors include: Weakness in the lower body. Being generally weak or confused due to long-term (chronic) illness. Dizziness or balance problems. Poor vision. Medicines that cause dizziness or drowsiness. These may include: Medicines for your blood pressure, heart, anxiety, insomnia, or swelling (edema). Pain medicines. Muscle relaxants. Other risk factors include: Drinking alcohol. Having had a fall in the past. Having foot pain or wearing improper footwear. Working at a dangerous job. Having any of the following in your home: Tripping hazards, such  as floor clutter or loose rugs. Poor lighting. Pets. Having dementia or memory loss. What actions can I take to lower my risk of falling?     Physical activity Stay physically fit. Do strength and balance exercises. Consider taking a regular class to build strength and balance. Yoga and tai chi are good options. Vision Have your eyes checked every year and your prescription for glasses or contacts updated as needed. Shoes and walking  aids Wear non-skid shoes. Wear shoes that have rubber soles and low heels. Do not wear high heels. Do not walk around the house in socks or slippers. Use a cane or walker as told by your provider. Home safety Attach secure railings on both sides of your stairs. Install grab bars for your bathtub, shower, and toilet. Use a non-skid mat in your bathtub or shower. Attach bath mats securely with double-sided, non-slip rug tape. Use good lighting in all rooms. Keep a flashlight near your bed. Make sure there is a clear path from your bed to the bathroom. Use night-lights. Do not use throw rugs. Make sure all carpeting is taped or tacked down securely. Remove all clutter from walkways and stairways, including extension cords. Repair uneven or broken steps and floors. Avoid walking on icy or slippery surfaces. Walk on the grass instead of on icy or slick sidewalks. Use ice melter to get rid of ice on walkways in the winter. Use a cordless phone. Questions to ask your health care provider Can you help me check my risk for a fall? Do any of my medicines make me more likely to fall? Should I take a vitamin D supplement? What exercises can I do to improve my strength and balance? Should I make an appointment to have my vision checked? Do I need a bone density test to check for weak bones (osteoporosis)? Would it help to use a cane or a walker? Where to find more information Centers for Disease Control and Prevention, STEAD[employer]: TonerPromos.no Community-Based Fall Prevention Programs: TonerPromos.no [employer] on Aging: BaseRingTones.pl Contact a health care provider if: You fall at home. You are afraid of falling at home. You feel weak, drowsy, or dizzy. This information is not intended to replace advice given to you by your health care provider. Make sure you discuss any questions you have with your health care provider. Document Revised: 09/23/2021 Document Reviewed: 09/23/2021 Elsevier Patient Education   2024 [employer] Inc.   Managing Pain Without Opioids Opioids are strong medicines used to treat moderate to severe pain. For some people, especially those who have long-term (chronic) pain, opioids may not be the best choice for pain management due to: Side effects like nausea, constipation, and sleepiness. The risk of addiction (opioid use disorder). The longer you take opioids, the greater your risk of addiction. Pain that lasts for more than 3 months is called chronic pain. Managing chronic pain usually requires more than one approach and is often provided by a team of health care providers working together (multidisciplinary approach). Pain management may be done at a pain management center or pain clinic. How to manage pain without the use of opioids Use non-opioid medicines Non-opioid medicines for pain may include: Over-the-counter or prescription non-steroidal anti-inflammatory drugs (NSAIDs). These may be the first medicines used for pain. They work well for muscle and bone pain, and they reduce swelling. Acetaminophen. This over-the-counter medicine may work well for milder pain but not swelling. Antidepressants. These may be used to treat chronic pain. A certain type of  antidepressant (tricyclics) is often used. These medicines are given in lower doses for pain than when used for depression. Anticonvulsants. These are usually used to treat seizures but may also reduce nerve (neuropathic) pain. Muscle relaxants. These relieve pain caused by sudden muscle tightening (spasms). You may also use a pain medicine that is applied to the skin as a patch, cream, or gel (topical analgesic), such as a numbing medicine. These may cause fewer side effects than medicines taken by mouth. Do certain therapies as directed Some therapies can help with pain management. They include: Physical therapy. You will do exercises to gain strength and flexibility. A physical therapist may teach you exercises to  move and stretch parts of your body that are weak, stiff, or painful. You can learn these exercises at physical therapy visits and practice them at home. Physical therapy may also involve: Massage. Heat wraps or applying heat or cold to affected areas. Electrical signals that interrupt pain signals (transcutaneous electrical nerve stimulation, TENS). Weak lasers that reduce pain and swelling (low-level laser therapy). Signals from your body that help you learn to regulate pain (biofeedback). Occupational therapy. This helps you to learn ways to function at home and work with less pain. Recreational therapy. This involves trying new activities or hobbies, such as a physical activity or drawing. Mental health therapy, including: Cognitive behavioral therapy (CBT). This helps you learn coping skills for dealing with pain. Acceptance and commitment therapy (ACT) to change the way you think and react to pain. Relaxation therapies, including muscle relaxation exercises and mindfulness-based stress reduction. Pain management counseling. This may be individual, family, or group counseling.  Receive medical treatments Medical treatments for pain management include: Nerve block injections. These may include a pain blocker and anti-inflammatory medicines. You may have injections: Near the spine to relieve chronic back or neck pain. Into joints to relieve back or joint pain. Into nerve areas that supply a painful area to relieve body pain. Into muscles (trigger point injections) to relieve some painful muscle conditions. A medical device placed near your spine to help block pain signals and relieve nerve pain or chronic back pain (spinal cord stimulation device). Acupuncture. Follow these instructions at home Medicines Take over-the-counter and prescription medicines only as told by your health care provider. If you are taking pain medicine, ask your health care providers about possible side effects to  watch out for. Do not drive or use heavy machinery while taking prescription opioid pain medicine. Lifestyle  Do not use drugs or alcohol to reduce pain. If you drink alcohol, limit how much you have to: 0-1 drink a day for women who are not pregnant. 0-2 drinks a day for men. Know how much alcohol is in a drink. In the [place] one drink equals one 12 oz bottle of beer (355 mL), one 5 oz glass of wine (148 mL), or one 1 oz glass of hard liquor (44 mL). Do not use any products that contain nicotine or tobacco. These products include cigarettes, chewing tobacco, and vaping devices, such as e-cigarettes. If you need help quitting, ask your health care provider. Eat a healthy diet and maintain a healthy weight. Poor diet and excess weight may make pain worse. Eat foods that are high in fiber. These include fresh fruits and vegetables, whole grains, and beans. Limit foods that are high in fat and processed sugars, such as fried and sweet foods. Exercise regularly. Exercise lowers stress and may help relieve pain. Ask your health care provider what activities  and exercises are safe for you. If your health care provider approves, join an exercise class that combines movement and stress reduction. Examples include yoga and tai chi. Get enough sleep. Lack of sleep may make pain worse. Lower stress as much as possible. Practice stress reduction techniques as told by your therapist. General instructions Work with all your pain management providers to find the treatments that work best for you. You are an important member of your pain management team. There are many things you can do to reduce pain on your own. Consider joining an online or in-person support group for people who have chronic pain. Keep all follow-up visits. This is important. Where to find more information You can find more information about managing pain without opioids from: American Academy of Pain Medicine: painmed.org Institute for  Chronic Pain: instituteforchronicpain.org American Chronic Pain Association: theacpa.org Contact a health care provider if: You have side effects from pain medicine. Your pain gets worse or does not get better with treatments or home therapy. You are struggling with anxiety or depression. Summary Many types of pain can be managed without opioids. Chronic pain may respond better to pain management without opioids. Pain is best managed when you and a team of health care providers work together. Pain management without opioids may include non-opioid medicines, medical treatments, physical therapy, mental health therapy, and lifestyle changes. Tell your health care providers if your pain gets worse or is not being managed well enough. This information is not intended to replace advice given to you by your health care provider. Make sure you discuss any questions you have with your health care provider. Document Revised: 05/02/2020 Document Reviewed: 05/02/2020 Elsevier Patient Education  2024 [employer].

## 2023-03-24 NOTE — Progress Notes (Addendum)
Please attest and cosign this visit due to patients primary care provider not being in the office at the time the visit was completed.   Because this visit was a virtual/telehealth visit,  certain criteria was not obtained, such a blood pressure, CBG if applicable, and timed get up and go. Any medications not marked as "taking" were not mentioned during the medication reconciliation part of the visit. Any vitals not documented were not able to be obtained due to this being a telehealth visit or patient was unable to self-report a recent blood pressure reading due to a lack of equipment at home via telehealth. Vitals that have been documented are verbally provided by the patient.  Interactive audio and video telecommunications were attempted between this provider and patient, however failed, due to patient having technical difficulties OR patient did not have access to video capability.  We continued and completed visit with audio only.  Subjective:   Sheri Fleming is a 73 y.o. female who presents for Medicare Annual (Subsequent) preventive examination.  Visit Complete: Virtual I connected with  Sheri Fleming on 03/24/23 by a audio enabled telemedicine application and verified that I am speaking with the correct person using two identifiers.  Patient Location: Home  Provider Location: Home Office  I discussed the limitations of evaluation and management by telemedicine. The patient expressed understanding and agreed to proceed.  Vital Signs: Because this visit was a virtual/telehealth visit, some criteria may be missing or patient reported. Any vitals not documented were not able to be obtained and vitals that have been documented are patient reported.  Cardiac Risk Factors include: advanced age (>13men, >43 women);dyslipidemia;hypertension;sedentary lifestyle     Objective:    Today's Vitals   03/24/23 0957 03/24/23 1001  BP: 135/82   Weight: 164 lb (74.4 kg)   Height: 5' 2.5" (1.588  m)   PainSc:  0-No pain   Body mass index is 29.52 kg/m.     03/24/2023    9:55 AM 05/31/2022    9:13 AM 12/20/2021    9:24 AM 12/17/2021    9:08 AM 12/12/2021    1:53 PM 12/03/2021    9:21 AM 09/09/2021   10:48 AM  Advanced Directives  Does Patient Have a Medical Advance Directive? No No No No No No No  Would patient like information on creating a medical advance directive? No - Patient declined  No - Patient declined Yes (ED - Information included in AVS) No - Patient declined No - Patient declined No - Patient declined    Current Medications (verified) Outpatient Encounter Medications as of 03/24/2023  Medication Sig   acetaminophen (TYLENOL) 500 MG tablet Take 500 mg by mouth every 6 (six) hours as needed for mild pain.   alendronate (FOSAMAX) 70 MG tablet TAKE 1 TABLET BY MOUTH WEEKLY  WITH 8 OZ OF PLAIN WATER 30  MINUTES BEFORE FIRST FOOD, DRINK OR MEDS. STAY UPRIGHT FOR 30  MINS   lisinopril-hydrochlorothiazide (ZESTORETIC) 20-25 MG tablet TAKE 1 TABLET BY MOUTH DAILY   montelukast (SINGULAIR) 10 MG tablet TAKE 1 TABLET BY MOUTH AT  BEDTIME   naproxen (NAPROSYN) 500 MG tablet Take 1 tablet (500 mg total) by mouth 2 (two) times daily with a meal.   simvastatin (ZOCOR) 20 MG tablet TAKE 1 TABLET BY MOUTH IN THE  EVENING   methocarbamol (ROBAXIN) 500 MG tablet Take 1 tablet (500 mg total) by mouth 2 (two) times daily as needed for muscle spasms. (Patient not taking: Reported  on 03/24/2023)   oxyCODONE (ROXICODONE) 5 MG immediate release tablet Take 1 tablet (5 mg total) by mouth every 6 (six) hours as needed for severe pain. (Patient not taking: Reported on 03/24/2023)   No facility-administered encounter medications on file as of 03/24/2023.    Allergies (verified) Taxotere [docetaxel]   History: Past Medical History:  Diagnosis Date   Arthritis    Asthma    Cancer (HCC) 05/22/2021   breast cancer   High cholesterol    Hypertension    Port-A-Cath in place 08/12/2021    Sleep apnea    Past Surgical History:  Procedure Laterality Date   ABDOMINAL HYSTERECTOMY     BREAST LUMPECTOMY WITH RADIOACTIVE SEED AND SENTINEL LYMPH NODE BIOPSY Left 06/26/2021   Procedure: LEFT BREAST LUMPECTOMY WITH RADIOACTIVE SEED AND SENTINEL LYMPH NODE BIOPSY;  Surgeon: Griselda Miner, MD;  Location: [facility] OR;  Service: General;  Laterality: Left;  GEN & PEC BLOCK   FRACTURE SURGERY Left    left lower leg   PORT-A-CATH REMOVAL Right 12/20/2021   Procedure: REMOVAL PORT-A-CATH;  Surgeon: Griselda Miner, MD;  Location: [facility];  Service: General;  Laterality: Right;   PORTACATH PLACEMENT N/A 06/26/2021   Procedure: PORT PLACEMENT;  Surgeon: Griselda Miner, MD;  Location: [facility] OR;  Service: General;  Laterality: N/A;   Family History  Problem Relation Age of Onset   Stroke Mother    Hypertension Mother    Lung cancer Father        d. 71s   Vaginal cancer Sister 4   Cancer Brother        unknown type; dx 10s; mets   Hypertension Son    Stroke Son    Kidney failure Son    Hypercholesterolemia Son    Stroke Other    Colon cancer Neg Hx    Breast cancer Neg Hx    Social History   Socioeconomic History   Marital status: Divorced    Spouse name: Not on file   Number of children: 4   Years of education: Not on file   Highest education level: Not on file  Occupational History   Not on file  Tobacco Use   Smoking status: Never   Smokeless tobacco: Never  Vaping Use   Vaping status: Never Used  Substance and Sexual Activity   Alcohol use: No   Drug use: No   Sexual activity: Not Currently  Other Topics Concern   Not on file  Social History Narrative   Lives with her ex husband. Has 4 children. Retired.    Social Drivers of Corporate investment banker Strain: Low Risk  (03/24/2023)   Overall Financial Resource Strain (CARDIA)    Difficulty of Paying Living Expenses: Not hard at all  Food Insecurity: No Food Insecurity (03/24/2023)   Hunger Vital Sign     Worried About Running Out of Food in the Last Year: Never true    Ran Out of Food in the Last Year: Never true  Transportation Needs: No Transportation Needs (03/24/2023)   PRAPARE - Administrator, Civil Service (Medical): No    Lack of Transportation (Non-Medical): No  Physical Activity: Sufficiently Active (03/24/2023)   Exercise Vital Sign    Days of Exercise per Week: 7 days    Minutes of Exercise per Session: 30 min  Stress: No Stress Concern Present (03/24/2023)   [employer] of Occupational Health - Occupational Stress Questionnaire  Feeling of Stress : Not at all  Social Connections: Moderately Isolated (03/24/2023)   Social Connection and Isolation Panel [NHANES]    Frequency of Communication with Friends and Family: More than three times a week    Frequency of Social Gatherings with Friends and Family: More than three times a week    Attends Religious Services: More than 4 times per year    Active Member of [employer] or Organizations: No    Attends Engineer, structural: Never    Marital Status: Divorced    Tobacco Counseling Counseling given: Yes   Clinical Intake:  Pre-visit preparation completed: Yes  Pain : No/denies pain Pain Score: 0-No pain     BMI - recorded: 29.52 Nutritional Status: BMI 25 -29 Overweight Nutritional Risks: None Diabetes: No  How often do you need to have someone help you when you read instructions, pamphlets, or other written materials from your doctor or pharmacy?: 1 - Never  Interpreter Needed?: No  Information entered by :: Maryjean Ka CMA   Activities of Daily Living    03/24/2023   10:08 AM  In your present state of health, do you have any difficulty performing the following activities:  Hearing? 0  Vision? 0  Difficulty concentrating or making decisions? 0  Walking or climbing stairs? 0  Dressing or bathing? 0  Doing errands, shopping? 0  Preparing Food and eating ? N  Using the Toilet? N  In the  past six months, have you accidently leaked urine? N  Do you have problems with loss of bowel control? N  Managing your Medications? N  Managing your Finances? N  Housekeeping or managing your Housekeeping? N    Patient Care Team: Gilmore Laroche, FNP as PCP - General (Family Medicine) Serena Croissant, MD as Consulting Physician (Hematology and Oncology) Dorothy Puffer, MD as Consulting Physician (Radiation Oncology) Burlene Arnt, DPM as Referring Physician (Podiatry) Axel Filler, Larna Daughters, NP as Nurse Practitioner (Hematology and Oncology) Mammography, [facility] (Diagnostic Radiology)  Indicate any recent Medical Services you may have received from other than [facility] providers in the past year (date may be approximate).     Assessment:   This is a routine wellness examination for [facility].  Hearing/Vision screen Hearing Screening - Comments:: Patient denies any hearing difficulties.   Vision Screening - Comments:: Patient is not up to date. Declines referral today.    Goals Addressed             This Visit's Progress    Patient Stated       Go back to work        Depression Screen    03/24/2023   10:09 AM 10/27/2022    3:13 PM 06/25/2022   10:48 AM 03/26/2022   10:07 AM 12/17/2021    9:09 AM 09/20/2021   11:02 AM 05/21/2021   11:04 AM  PHQ 2/9 Scores  PHQ - 2 Score 0 0 0 3 0 0 0  PHQ- 9 Score  0 0 3       Fall Risk    03/24/2023   10:08 AM 03/16/2023    3:19 PM 10/27/2022    3:13 PM 06/25/2022   10:48 AM 12/17/2021    9:09 AM  Fall Risk   Falls in the past year? 0 0 0 0 0  Number falls in past yr: 0 0 0 0 0  Injury with Fall? 0 0 0 0 0  Risk for fall due to : No Fall Risks No  Fall Risks No Fall Risks No Fall Risks No Fall Risks  Follow up Falls prevention discussed;Falls evaluation completed Falls evaluation completed Falls evaluation completed Falls evaluation completed Falls evaluation completed    MEDICARE RISK AT HOME: Medicare Risk at Home Any stairs in  or around the home?: Yes If so, are there any without handrails?: No Home free of loose throw rugs in walkways, pet beds, electrical cords, etc?: Yes Adequate lighting in your home to reduce risk of falls?: Yes Life alert?: No Use of a cane, walker or w/c?: No Grab bars in the bathroom?: Yes Shower chair or bench in shower?: No Elevated toilet seat or a handicapped toilet?: No  TIMED UP AND GO:  Was the test performed?  No    Cognitive Function:        03/24/2023   10:02 AM 12/17/2021    9:11 AM  6CIT Screen  What Year? 0 points 0 points  What month? 0 points 0 points  What time? 0 points 0 points  Count back from 20 0 points 0 points  Months in reverse 0 points 4 points  Repeat phrase 0 points 0 points  Total Score 0 points 4 points    Immunizations Immunization History  Administered Date(s) Administered   Fluad Trivalent(High Dose 65+) 10/27/2022   Influenza-Unspecified 01/22/2022   Moderna SARS-COV2 Booster Vaccination 03/26/2020   Moderna Sars-Covid-2 Vaccination 06/07/2019, 07/15/2019   Td 06/16/1997    TDAP status: Due, Education has been provided regarding the importance of this vaccine. Advised may receive this vaccine at local pharmacy or Health Dept. Aware to provide a copy of the vaccination record if obtained from local pharmacy or Health Dept. Verbalized acceptance and understanding.  Flu Vaccine status: Up to date  Pneumococcal vaccine status: Due, Education has been provided regarding the importance of this vaccine. Advised may receive this vaccine at local pharmacy or Health Dept. Aware to provide a copy of the vaccination record if obtained from local pharmacy or Health Dept. Verbalized acceptance and understanding.  Covid-19 vaccine status: Declined, Education has been provided regarding the importance of this vaccine but patient still declined. Advised may receive this vaccine at local pharmacy or Health Dept.or vaccine clinic. Aware to provide a copy  of the vaccination record if obtained from local pharmacy or Health Dept. Verbalized acceptance and understanding.  Qualifies for Shingles Vaccine? Yes   Zostavax completed No   Shingrix Completed?: No.    Education has been provided regarding the importance of this vaccine. Patient has been advised to call insurance company to determine out of pocket expense if they have not yet received this vaccine. Advised may also receive vaccine at local pharmacy or Health Dept. Verbalized acceptance and understanding.  Screening Tests Health Maintenance  Topic Date Due   Pneumonia Vaccine 46+ Years old (1 of 2 - PCV) Never done   Zoster Vaccines- Shingrix (1 of 2) Never done   DTaP/Tdap/Td (2 - Tdap) 06/17/2007   Medicare Annual Wellness (AWV)  12/18/2022   COVID-19 Vaccine (3 - Moderna risk series) 04/01/2023 (Originally 04/23/2020)   MAMMOGRAM  05/16/2023   DEXA SCAN  08/26/2024   Fecal DNA (Cologuard)  10/01/2024   INFLUENZA VACCINE  Completed   Hepatitis C Screening  Completed   HPV VACCINES  Aged Out    Health Maintenance  Health Maintenance Due  Topic Date Due   Pneumonia Vaccine 41+ Years old (1 of 2 - PCV) Never done   Zoster Vaccines- Shingrix (1 of 2)  Never done   DTaP/Tdap/Td (2 - Tdap) 06/17/2007   Medicare Annual Wellness (AWV)  12/18/2022    Colorectal cancer screening: Type of screening: Cologuard. Completed 10/01/2021. Repeat every 3 years  Mammogram status: Ordered 03/24/2023. Pt provided with contact info and advised to call to schedule appt.   Bone Density status: Completed 08/27/2022. Results reflect: Bone density results: OSTEOPENIA. Repeat every 2 years.  Lung Cancer Screening: (Low Dose CT Chest recommended if Age 21-80 years, 20 pack-year currently smoking OR have quit w/in 15years.) does not qualify.   Lung Cancer Screening Referral: na  Additional Screening:  Hepatitis C Screening: does not qualify; Completed   Vision Screening: Recommended annual  ophthalmology exams for early detection of glaucoma and other disorders of the eye. Is the patient up to date with their annual eye exam?  No  Who is the provider or what is the name of the office in which the patient attends annual eye exams? na If pt is not established with a provider, would they like to be referred to a provider to establish care? Yes .   Dental Screening: Recommended annual dental exams for proper oral hygiene  Diabetic Foot Exam: na  Community Resource Referral / Chronic Care Management: CRR required this visit?  No   CCM required this visit?  No     Plan:     I have personally reviewed and noted the following in the patient's chart:   Medical and social history Use of alcohol, tobacco or illicit drugs  Current medications and supplements including opioid prescriptions. Patient is currently taking opioid prescriptions. Information provided to patient regarding non-opioid alternatives. Patient advised to discuss non-opioid treatment plan with their provider. Functional ability and status Nutritional status Physical activity Advanced directives List of other physicians Hospitalizations, surgeries, and ER visits in previous 12 months Vitals Screenings to include cognitive, depression, and falls Referrals and appointments  In addition, I have reviewed and discussed with patient certain preventive protocols, quality metrics, and best practice recommendations. A written personalized care plan for preventive services as well as general preventive health recommendations were provided to patient.     Jordan Hawks Sahana Boyland, CMA   03/24/2023   After Visit Summary: (Mail) Due to this being a telephonic visit, the after visit summary with patients personalized plan was offered to patient via mail   Nurse Notes: see routing comment

## 2023-03-25 LAB — TSH+FREE T4
Free T4: 1.14 ng/dL (ref 0.82–1.77)
TSH: 1.65 u[IU]/mL (ref 0.450–4.500)

## 2023-03-25 LAB — CBC WITH DIFFERENTIAL/PLATELET
Basophils Absolute: 0.1 10*3/uL (ref 0.0–0.2)
Basos: 2 %
EOS (ABSOLUTE): 0.1 10*3/uL (ref 0.0–0.4)
Eos: 1 %
Hematocrit: 31.1 % — ABNORMAL LOW (ref 34.0–46.6)
Hemoglobin: 10.5 g/dL — ABNORMAL LOW (ref 11.1–15.9)
Immature Grans (Abs): 0 10*3/uL (ref 0.0–0.1)
Immature Granulocytes: 0 %
Lymphocytes Absolute: 2.6 10*3/uL (ref 0.7–3.1)
Lymphs: 49 %
MCH: 33.3 pg — ABNORMAL HIGH (ref 26.6–33.0)
MCHC: 33.8 g/dL (ref 31.5–35.7)
MCV: 99 fL — ABNORMAL HIGH (ref 79–97)
Monocytes Absolute: 0.6 10*3/uL (ref 0.1–0.9)
Monocytes: 11 %
Neutrophils Absolute: 2 10*3/uL (ref 1.4–7.0)
Neutrophils: 37 %
Platelets: 302 10*3/uL (ref 150–450)
RBC: 3.15 x10E6/uL — ABNORMAL LOW (ref 3.77–5.28)
RDW: 13.8 % (ref 11.7–15.4)
WBC: 5.4 10*3/uL (ref 3.4–10.8)

## 2023-03-25 LAB — LIPID PANEL
Chol/HDL Ratio: 2.3 {ratio} (ref 0.0–4.4)
Cholesterol, Total: 136 mg/dL (ref 100–199)
HDL: 58 mg/dL (ref >39)
LDL Chol Calc (NIH): 65 mg/dL (ref 0–99)
Triglycerides: 64 mg/dL (ref 0–149)
VLDL Cholesterol Cal: 13 mg/dL (ref 5–40)

## 2023-03-25 LAB — CMP14+EGFR
ALT: 30 [IU]/L (ref 0–32)
AST: 42 [IU]/L — ABNORMAL HIGH (ref 0–40)
Albumin: 3.4 g/dL — ABNORMAL LOW (ref 3.8–4.8)
Alkaline Phosphatase: 138 [IU]/L — ABNORMAL HIGH (ref 44–121)
BUN/Creatinine Ratio: 24 (ref 12–28)
BUN: 28 mg/dL — ABNORMAL HIGH (ref 8–27)
Bilirubin Total: 0.4 mg/dL (ref 0.0–1.2)
CO2: 24 mmol/L (ref 20–29)
Calcium: 9 mg/dL (ref 8.7–10.3)
Chloride: 101 mmol/L (ref 96–106)
Creatinine, Ser: 1.16 mg/dL — ABNORMAL HIGH (ref 0.57–1.00)
Globulin, Total: 4.3 g/dL (ref 1.5–4.5)
Glucose: 92 mg/dL (ref 70–99)
Potassium: 4.5 mmol/L (ref 3.5–5.2)
Sodium: 138 mmol/L (ref 134–144)
Total Protein: 7.7 g/dL (ref 6.0–8.5)
eGFR: 49 mL/min/{1.73_m2} — ABNORMAL LOW (ref >59)

## 2023-03-25 LAB — HEMOGLOBIN A1C
Est. average glucose Bld gHb Est-mCnc: 117 mg/dL
Hgb A1c MFr Bld: 5.7 % — ABNORMAL HIGH (ref 4.8–5.6)

## 2023-03-25 LAB — VITAMIN D 25 HYDROXY (VIT D DEFICIENCY, FRACTURES): Vit D, 25-Hydroxy: 15.1 ng/mL — ABNORMAL LOW (ref 30.0–100.0)

## 2023-03-30 ENCOUNTER — Other Ambulatory Visit: Payer: Self-pay | Admitting: Family Medicine

## 2023-03-30 DIAGNOSIS — E559 Vitamin D deficiency, unspecified: Secondary | ICD-10-CM

## 2023-03-30 MED ORDER — VITAMIN D (ERGOCALCIFEROL) 1.25 MG (50000 UNIT) PO CAPS
50000.0000 [IU] | ORAL_CAPSULE | ORAL | 1 refills | Status: DC
Start: 2023-03-30 — End: 2023-09-03

## 2023-03-30 NOTE — Progress Notes (Signed)
 Please inform the patient that her labs indicate prediabetes, and I recommend implementing lifestyle changes by reducing her intake of high-sugar foods and beverages while increasing physical activity to help prevent progression to diabetes.  A prescription for a weekly vitamin D supplement has been sent to her pharmacy, as her vitamin D levels are low.  I also recommend increasing her fluid intake to at least 64 ounces daily, as her labs indicate inadequate hydration.  Additionally, I recommend increasing her intake of iron-rich foods, such as lean meats, leafy green vegetables (spinach, kale), beans, lentils, fortified cereals, nuts, and seeds, to help maintain healthy iron levels.

## 2023-04-07 ENCOUNTER — Emergency Department (HOSPITAL_COMMUNITY)
Admission: EM | Admit: 2023-04-07 | Discharge: 2023-04-07 | Disposition: A | Discharge status: Home / Self Care | Attending: Emergency Medicine | Admitting: Emergency Medicine

## 2023-04-07 ENCOUNTER — Encounter (HOSPITAL_COMMUNITY): Payer: Self-pay

## 2023-04-07 ENCOUNTER — Other Ambulatory Visit: Payer: Self-pay

## 2023-04-07 DIAGNOSIS — Z7951 Long term (current) use of inhaled steroids: Secondary | ICD-10-CM | POA: Insufficient documentation

## 2023-04-07 DIAGNOSIS — J45909 Unspecified asthma, uncomplicated: Secondary | ICD-10-CM | POA: Insufficient documentation

## 2023-04-07 DIAGNOSIS — Z853 Personal history of malignant neoplasm of breast: Secondary | ICD-10-CM | POA: Diagnosis not present

## 2023-04-07 DIAGNOSIS — I10 Essential (primary) hypertension: Secondary | ICD-10-CM | POA: Insufficient documentation

## 2023-04-07 DIAGNOSIS — J069 Acute upper respiratory infection, unspecified: Secondary | ICD-10-CM | POA: Diagnosis not present

## 2023-04-07 DIAGNOSIS — R059 Cough, unspecified: Secondary | ICD-10-CM | POA: Diagnosis present

## 2023-04-07 DIAGNOSIS — Z79899 Other long term (current) drug therapy: Secondary | ICD-10-CM | POA: Diagnosis not present

## 2023-04-07 MED ORDER — BENZONATATE 100 MG PO CAPS: 100.0000 mg | ORAL_CAPSULE | Freq: Three times a day (TID) | ORAL | 0 refills | Status: DC

## 2023-04-07 MED ORDER — FLUTICASONE PROPIONATE 50 MCG/ACT NA SUSP: 2.0000 | Freq: Every day | NASAL | 0 refills | Status: DC

## 2023-04-07 NOTE — Discharge Instructions (Signed)
 You are seen in the ER evaluation of cough, congestion for the past several days.  At this time there is no indication that you need antibiotics.  Symptoms likely due to a virus.  We are prescribing cough medication and nasal spray to help alleviate your symptoms, drink plenty of fluids and rest.  If you develop fever, trouble breathing or any other worsening symptoms come back to the ER otherwise follow-up closely with your primary care doctor.

## 2023-04-07 NOTE — ED Provider Notes (Signed)
 [place] EMERGENCY DEPARTMENT AT [facility] Provider Note   CSN: [id number] Arrival date & time: 04/07/23  9147     History  Chief Complaint  Patient presents with   Nasal Congestion    Sheri Fleming is a 73 y.o. female.  She has PMH of hypertension, asthma, high cholesterol, breast cancer status post treatment in remission.  She presents today for about 3 days of cough, congestion and nasal congestion.  She has been taking over-the-counter DayQuil and NyQuil that helped temporarily but when the medication wears off her symptoms return.  She denies fever or chills, reports some mild bodyaches, denies sick contacts.  She did get her at this year.  She has not needed to use her inhaler.  HPI     Home Medications Prior to Admission medications   Medication Sig Start Date End Date Taking? Authorizing Provider  benzonatate (TESSALON) 100 MG capsule Take 1 capsule (100 mg total) by mouth every 8 (eight) hours. 04/07/23  Yes Kenichi Cassada A, PA-C  fluticasone (FLONASE) 50 MCG/ACT nasal spray Place 2 sprays into both nostrils daily for 7 days. 04/07/23 04/14/23 Yes Cheryl Chay A, PA-C  acetaminophen (TYLENOL) 500 MG tablet Take 500 mg by mouth every 6 (six) hours as needed for mild pain.    [provider]  alendronate (FOSAMAX) 70 MG tablet TAKE 1 TABLET BY MOUTH WEEKLY  WITH 8 OZ OF PLAIN WATER 30  MINUTES BEFORE FIRST FOOD, DRINK OR MEDS. STAY UPRIGHT FOR 30  MINS 12/30/22   Gilmore Laroche, FNP  lisinopril-hydrochlorothiazide (ZESTORETIC) 20-25 MG tablet TAKE 1 TABLET BY MOUTH DAILY 09/18/22   Gilmore Laroche, FNP  methocarbamol (ROBAXIN) 500 MG tablet Take 1 tablet (500 mg total) by mouth 2 (two) times daily as needed for muscle spasms. Patient not taking: Reported on 03/24/2023 05/31/22   Eber Hong, MD  montelukast (SINGULAIR) 10 MG tablet TAKE 1 TABLET BY MOUTH AT  BEDTIME 02/26/23   Gilmore Laroche, FNP  naproxen (NAPROSYN) 500 MG tablet Take 1 tablet (500 mg  total) by mouth 2 (two) times daily with a meal. 05/31/22   Eber Hong, MD  oxyCODONE (ROXICODONE) 5 MG immediate release tablet Take 1 tablet (5 mg total) by mouth every 6 (six) hours as needed for severe pain. Patient not taking: Reported on 03/24/2023 12/20/21   Chevis Pretty III, MD  simvastatin (ZOCOR) 20 MG tablet TAKE 1 TABLET BY MOUTH IN THE  EVENING 08/25/22   Gilmore Laroche, FNP  Vitamin D, Ergocalciferol, (DRISDOL) 1.25 MG (50000 UNIT) CAPS capsule Take 1 capsule (50,000 Units total) by mouth every 7 (seven) days. 03/30/23   Gilmore Laroche, FNP      Allergies    Taxotere [docetaxel]    Review of Systems   Review of Systems  Physical Exam Updated Vital Signs BP 113/68 (BP Location: Left Arm)   Pulse 87   Temp 98.6 F (37 C) (Temporal)   Resp 16   Ht 5' 2.5" (1.588 m)   Wt 74.3 kg   SpO2 97%   BMI 29.48 kg/m  Physical Exam Vitals and nursing note reviewed.  Constitutional:      General: She is not in acute distress.    Appearance: She is well-developed.  HENT:     Head: Normocephalic and atraumatic.     Mouth/Throat:     Mouth: Mucous membranes are moist.  Eyes:     Conjunctiva/sclera: Conjunctivae normal.  Cardiovascular:     Rate and Rhythm: Normal rate  and regular rhythm.     Heart sounds: No murmur heard. Pulmonary:     Effort: Pulmonary effort is normal. No respiratory distress.     Breath sounds: Normal breath sounds.  Abdominal:     Palpations: Abdomen is soft.     Tenderness: There is no abdominal tenderness.  Musculoskeletal:        General: No swelling.     Cervical back: Neck supple.  Skin:    General: Skin is warm and dry.     Capillary Refill: Capillary refill takes less than 2 seconds.  Neurological:     General: No focal deficit present.     Mental Status: She is alert and oriented to person, place, and time.  Psychiatric:        Mood and Affect: Mood normal.     ED Results / Procedures / Treatments   Labs (all labs ordered are  listed, but only abnormal results are displayed) Labs Reviewed - No data to display  EKG None  Radiology No results found.  Procedures Procedures    Medications Ordered in ED Medications - No data to display  ED Course/ Medical Decision Making/ A&P                                 Medical Decision Making DDx: Influenza, COVID-19, allergic rhinitis, viral illness, bacterial sinusitis, other ED course: Patient presents with cough and congestion with bodyaches for about 3 days, she is refusing any further testing such as COVID flu and RSV swab or chest x-ray.  States she just wants medication.  Discussed that symptoms are likely related to viral illness, she has a reassuring exam and vitals, she is not having any wheezing.  Will treat with Tessalon Perles and Flonase and have her follow-up with her PCP, she was given strict return precautions.  Risk Prescription drug management.           Final Clinical Impression(s) / ED Diagnoses Final diagnoses:  Viral upper respiratory tract infection    Rx / DC Orders ED Discharge Orders          Ordered    benzonatate (TESSALON) 100 MG capsule  Every 8 hours        04/07/23 1002    fluticasone (FLONASE) 50 MCG/ACT nasal spray  Daily        04/07/23 [address] PA-C 04/07/23 1005    Terrilee Files, MD 04/07/23 1816

## 2023-04-07 NOTE — ED Triage Notes (Signed)
 Pt arrived via [facility] c/o "head congestion" since Sunday, and endorses sinus congestion. Pt denies cough. Pt reports taking OTC medications with minimal relief.

## 2023-04-07 NOTE — ED Notes (Signed)
 Pt refused covid/flu swab

## 2023-05-08 ENCOUNTER — Other Ambulatory Visit: Payer: Self-pay | Admitting: Family Medicine

## 2023-05-08 DIAGNOSIS — E785 Hyperlipidemia, unspecified: Secondary | ICD-10-CM

## 2023-05-18 LAB — HM MAMMOGRAPHY

## 2023-05-31 ENCOUNTER — Other Ambulatory Visit: Payer: Self-pay | Admitting: Family Medicine

## 2023-06-22 ENCOUNTER — Telehealth: Payer: Self-pay

## 2023-06-22 LAB — SIGNATERA ONLY (NATERA MANAGED)
SIGNATERA MTM READOUT: 0 MTM/ml
SIGNATERA TEST RESULT: NEGATIVE

## 2023-06-22 NOTE — Telephone Encounter (Signed)
 Attempted to call pt regarding signatera results left message for pt to return call back. Results was negative and guardant will repeat labs in 6 months.

## 2023-07-16 ENCOUNTER — Encounter: Payer: Self-pay | Admitting: Family Medicine

## 2023-07-16 ENCOUNTER — Ambulatory Visit (INDEPENDENT_AMBULATORY_CARE_PROVIDER_SITE_OTHER): Payer: 59 | Admitting: Family Medicine

## 2023-07-16 VITALS — BP 109/64 | HR 88 | Resp 16 | Ht 62.0 in | Wt 159.1 lb

## 2023-07-16 DIAGNOSIS — R7301 Impaired fasting glucose: Secondary | ICD-10-CM

## 2023-07-16 DIAGNOSIS — E559 Vitamin D deficiency, unspecified: Secondary | ICD-10-CM

## 2023-07-16 DIAGNOSIS — E7849 Other hyperlipidemia: Secondary | ICD-10-CM

## 2023-07-16 DIAGNOSIS — R7303 Prediabetes: Secondary | ICD-10-CM | POA: Diagnosis not present

## 2023-07-16 DIAGNOSIS — E038 Other specified hypothyroidism: Secondary | ICD-10-CM

## 2023-07-16 DIAGNOSIS — I10 Essential (primary) hypertension: Secondary | ICD-10-CM | POA: Diagnosis not present

## 2023-07-16 NOTE — Patient Instructions (Addendum)
 I appreciate the opportunity to provide care to you today!    Follow up:  4 months  Labs: please stop by the lab during the week to get your blood drawn (CBC, CMP, TSH, Lipid profile, HgA1c, Vit D)  For a Healthier YOU, I Recommend: Reducing your intake of sugar, sodium, carbohydrates, and saturated fats. Increasing your fiber intake by incorporating more whole grains, fruits, and vegetables into your meals. Setting healthy goals with a focus on lowering your consumption of carbs, sugar, and unhealthy fats. Adding variety to your diet by including a wide range of fruits and vegetables. Cutting back on soda and limiting processed foods as much as possible. Staying active: In addition to taking your weight loss medication, aim for at least 150 minutes of moderate-intensity physical activity each week for optimal results.    Please follow up if your symptoms worsen or fail to improve.   Please continue to a heart-healthy diet and increase your physical activities. Try to exercise for at least five days a week.    It was a pleasure to see you and I look forward to continuing to work together on your health and well-being. Please do not hesitate to call the office if you need care or have questions about your care.  In case of emergency, please visit the Emergency Department for urgent care, or contact our clinic at [phone] to schedule an appointment. We're here to help you!   Have a wonderful day and week. With Gratitude, Jalei Shibley MSN, FNP-BC

## 2023-07-16 NOTE — Assessment & Plan Note (Signed)
 Controlled Stable on lisinopril -hydrochlorothiazide  20-25 milligrams daily Asymptomatic in the clinic A low-sodium diet of less than 2300 mg daily is recommended, along with increased physical activity of moderate intensity, aiming for 150 minutes weekly. The patient is encouraged to continue with these lifestyle modifications to help manage their blood pressure effectively.  BP Readings from Last 3 Encounters:  07/16/23 109/64  04/07/23 113/68  03/24/23 135/82

## 2023-07-16 NOTE — Assessment & Plan Note (Signed)
 Lifestyle modifications for prediabetes were discussed, including adopting a heart-healthy diet and increasing physical activity. The patient was encouraged to decrease her intake of high-sugar foods and beverages. She verbalized understanding and is aware of the plan of care.

## 2023-07-16 NOTE — Progress Notes (Signed)
 Established Patient Office Visit  Subjective:  Patient ID: Sheri Fleming, female    DOB: [birth year]  Age: 73 y.o. MRN: [id number]  CC:  Chief Complaint  Patient presents with   Hypertension    Follow up visit, no concerns     HPI Sheri Fleming is a 73 y.o. female with past medical history of Hypertension, hyperlipidemia presents for f/u of  chronic medical conditions.  For the details of today's visit, please refer to the assessment and plan.     Past Medical History:  Diagnosis Date   Arthritis    Asthma    Cancer (HCC) 05/22/2021   breast cancer   High cholesterol    Hypertension    Port-A-Cath in place 08/12/2021   Sleep apnea     Past Surgical History:  Procedure Laterality Date   ABDOMINAL HYSTERECTOMY     BREAST LUMPECTOMY WITH RADIOACTIVE SEED AND SENTINEL LYMPH NODE BIOPSY Left 06/26/2021   Procedure: LEFT BREAST LUMPECTOMY WITH RADIOACTIVE SEED AND SENTINEL LYMPH NODE BIOPSY;  Surgeon: Caralyn Chandler, MD;  Location: [facility] OR;  Service: General;  Laterality: Left;  GEN & PEC BLOCK   FRACTURE SURGERY Left    left lower leg   PORT-A-CATH REMOVAL Right 12/20/2021   Procedure: REMOVAL PORT-A-CATH;  Surgeon: Caralyn Chandler, MD;  Location: [facility];  Service: General;  Laterality: Right;   PORTACATH PLACEMENT N/A 06/26/2021   Procedure: PORT PLACEMENT;  Surgeon: Caralyn Chandler, MD;  Location: [facility] OR;  Service: General;  Laterality: N/A;    Family History  Problem Relation Age of Onset   Stroke Mother    Hypertension Mother    Lung cancer Father        d. 30s   Vaginal cancer Sister 21   Cancer Brother        unknown type; dx 70s; mets   Hypertension Son    Stroke Son    Kidney failure Son    Hypercholesterolemia Son    Stroke Other    Colon cancer Neg Hx    Breast cancer Neg Hx     Social History   Socioeconomic History   Marital status: Divorced    Spouse name: Not on file   Number of children: 4   Years of education: Not on file    Highest education level: Not on file  Occupational History   Not on file  Tobacco Use   Smoking status: Never   Smokeless tobacco: Never  Vaping Use   Vaping status: Never Used  Substance and Sexual Activity   Alcohol use: No   Drug use: No   Sexual activity: Not Currently  Other Topics Concern   Not on file  Social History Narrative   Lives with her ex husband. Has 4 children. Retired.    Social Drivers of Corporate investment banker Strain: Low Risk  (03/24/2023)   Overall Financial Resource Strain (CARDIA)    Difficulty of Paying Living Expenses: Not hard at all  Food Insecurity: No Food Insecurity (03/24/2023)   Hunger Vital Sign    Worried About Running Out of Food in the Last Year: Never true    Ran Out of Food in the Last Year: Never true  Transportation Needs: No Transportation Needs (03/24/2023)   PRAPARE - Administrator, Civil Service (Medical): No    Lack of Transportation (Non-Medical): No  Physical Activity: Sufficiently Active (03/24/2023)   Exercise Vital Sign  Days of Exercise per Week: 7 days    Minutes of Exercise per Session: 30 min  Stress: No Stress Concern Present (03/24/2023)   [employer] of Occupational Health - Occupational Stress Questionnaire    Feeling of Stress : Not at all  Social Connections: Moderately Isolated (03/24/2023)   Social Connection and Isolation Panel    Frequency of Communication with Friends and Family: More than three times a week    Frequency of Social Gatherings with Friends and Family: More than three times a week    Attends Religious Services: More than 4 times per year    Active Member of [employer] or Organizations: No    Attends Banker Meetings: Never    Marital Status: Divorced  Catering manager Violence: Not At Risk (03/24/2023)   Humiliation, Afraid, Rape, and Kick questionnaire    Fear of Current or Ex-Partner: No    Emotionally Abused: No    Physically Abused: No    Sexually Abused:  No    Outpatient Medications Prior to Visit  Medication Sig Dispense Refill   acetaminophen  (TYLENOL ) 500 MG tablet Take 500 mg by mouth every 6 (six) hours as needed for mild pain.     alendronate  (FOSAMAX ) 70 MG tablet TAKE 1 TABLET BY MOUTH WEEKLY  WITH 8 OZ OF PLAIN WATER 30  MINUTES BEFORE FIRST FOOD, DRINK OR MEDS. STAY UPRIGHT FOR 30  MINS 12 tablet 3   fluticasone  (FLONASE ) 50 MCG/ACT nasal spray Place 2 sprays into both nostrils daily for 7 days. 11.1 mL 0   lisinopril -hydrochlorothiazide  (ZESTORETIC ) 20-25 MG tablet TAKE 1 TABLET BY MOUTH DAILY 100 tablet 2   montelukast  (SINGULAIR ) 10 MG tablet TAKE 1 TABLET BY MOUTH AT  BEDTIME 100 tablet 2   naproxen  (NAPROSYN ) 500 MG tablet Take 1 tablet (500 mg total) by mouth 2 (two) times daily with a meal. 30 tablet 0   simvastatin  (ZOCOR ) 20 MG tablet TAKE 1 TABLET BY MOUTH IN THE  EVENING 100 tablet 2   Vitamin D , Ergocalciferol , (DRISDOL ) 1.25 MG (50000 UNIT) CAPS capsule Take 1 capsule (50,000 Units total) by mouth every 7 (seven) days. 20 capsule 1   benzonatate  (TESSALON ) 100 MG capsule Take 1 capsule (100 mg total) by mouth every 8 (eight) hours. 21 capsule 0   methocarbamol  (ROBAXIN ) 500 MG tablet Take 1 tablet (500 mg total) by mouth 2 (two) times daily as needed for muscle spasms. (Patient not taking: Reported on 07/16/2023) 20 tablet 0   oxyCODONE  (ROXICODONE ) 5 MG immediate release tablet Take 1 tablet (5 mg total) by mouth every 6 (six) hours as needed for severe pain. (Patient not taking: Reported on 07/16/2023) 5 tablet 0   No facility-administered medications prior to visit.    Allergies  Allergen Reactions   Taxotere  [Docetaxel ] Other (See Comments)    Pt had hypersensitivity rxn to Docetaxel . See progress note from 07/29/21 at 1419. Infusion discontinued.     ROS Review of Systems  Constitutional:  Negative for chills and fever.  Eyes:  Negative for visual disturbance.  Respiratory:  Negative for chest tightness and  shortness of breath.   Neurological:  Negative for dizziness and headaches.      Objective:    Physical Exam HENT:     Head: Normocephalic.     Mouth/Throat:     Mouth: Mucous membranes are moist.   Cardiovascular:     Rate and Rhythm: Normal rate.     Heart sounds: Normal heart sounds.  Pulmonary:     Effort: Pulmonary effort is normal.     Breath sounds: Normal breath sounds.   Neurological:     Mental Status: She is alert.     BP 109/64   Pulse 88   Resp 16   Ht 5' 2 (1.575 m)   Wt 159 lb 1.9 oz (72.2 kg)   SpO2 97%   BMI 29.10 kg/m  Wt Readings from Last 3 Encounters:  07/16/23 159 lb 1.9 oz (72.2 kg)  04/07/23 163 lb 12.8 oz (74.3 kg)  03/24/23 164 lb (74.4 kg)    Lab Results  Component Value Date   TSH 1.650 03/24/2023   Lab Results  Component Value Date   WBC 5.4 03/24/2023   HGB 10.5 (L) 03/24/2023   HCT 31.1 (L) 03/24/2023   MCV 99 (H) 03/24/2023   PLT 302 03/24/2023   Lab Results  Component Value Date   NA 138 03/24/2023   K 4.5 03/24/2023   CO2 24 03/24/2023   GLUCOSE 92 03/24/2023   BUN 28 (H) 03/24/2023   CREATININE 1.16 (H) 03/24/2023   BILITOT 0.4 03/24/2023   ALKPHOS 138 (H) 03/24/2023   AST 42 (H) 03/24/2023   ALT 30 03/24/2023   PROT 7.7 03/24/2023   ALBUMIN 3.4 (L) 03/24/2023   CALCIUM 9.0 03/24/2023   ANIONGAP 13 12/18/2021   EGFR 49 (L) 03/24/2023   Lab Results  Component Value Date   CHOL 136 03/24/2023   Lab Results  Component Value Date   HDL 58 03/24/2023   Lab Results  Component Value Date   LDLCALC 65 03/24/2023   Lab Results  Component Value Date   TRIG 64 03/24/2023   Lab Results  Component Value Date   CHOLHDL 2.3 03/24/2023   Lab Results  Component Value Date   HGBA1C 5.7 (H) 03/24/2023      Assessment & Plan:  Primary hypertension Assessment & Plan: Controlled Stable on lisinopril -hydrochlorothiazide  20-25 milligrams daily Asymptomatic in the clinic A low-sodium diet of less than 2300  mg daily is recommended, along with increased physical activity of moderate intensity, aiming for 150 minutes weekly. The patient is encouraged to continue with these lifestyle modifications to help manage their blood pressure effectively.  BP Readings from Last 3 Encounters:  07/16/23 109/64  04/07/23 113/68  03/24/23 135/82      Prediabetes Assessment & Plan: Lifestyle modifications for prediabetes were discussed, including adopting a heart-healthy diet and increasing physical activity. The patient was encouraged to decrease her intake of high-sugar foods and beverages. She verbalized understanding and is aware of the plan of care.    IFG (impaired fasting glucose) -     Hemoglobin A1c  Vitamin D  deficiency -     VITAMIN D  25 Hydroxy (Vit-D Deficiency, Fractures)  TSH (thyroid -stimulating hormone deficiency) -     TSH + free T4  Other hyperlipidemia -     Lipid panel -     CMP14+EGFR -     CBC with Differential/Platelet  Note: This chart has been completed using Engineer, civil (consulting) software, and while attempts have been made to ensure accuracy, certain words and phrases may not be transcribed as intended.    Follow-up: Return in about 4 months (around 11/15/2023).   Osa Fogarty, FNP

## 2023-07-21 LAB — CMP14+EGFR
ALT: 11 IU/L (ref 0–32)
AST: 15 IU/L (ref 0–40)
Albumin: 4.7 g/dL (ref 3.8–4.8)
Alkaline Phosphatase: 40 IU/L — ABNORMAL LOW (ref 44–121)
BUN/Creatinine Ratio: 21 (ref 12–28)
BUN: 19 mg/dL (ref 8–27)
Bilirubin Total: 1 mg/dL (ref 0.0–1.2)
CO2: 21 mmol/L (ref 20–29)
Calcium: 9.5 mg/dL (ref 8.7–10.3)
Chloride: 101 mmol/L (ref 96–106)
Creatinine, Ser: 0.89 mg/dL (ref 0.57–1.00)
Globulin, Total: 2.7 g/dL (ref 1.5–4.5)
Glucose: 86 mg/dL (ref 70–99)
Potassium: 4.4 mmol/L (ref 3.5–5.2)
Sodium: 139 mmol/L (ref 134–144)
Total Protein: 7.4 g/dL (ref 6.0–8.5)
eGFR: 68 mL/min/{1.73_m2} (ref >59)

## 2023-07-21 LAB — VITAMIN D 25 HYDROXY (VIT D DEFICIENCY, FRACTURES): Vit D, 25-Hydroxy: 72.1 ng/mL (ref 30.0–100.0)

## 2023-07-21 LAB — LIPID PANEL
Chol/HDL Ratio: 2.4 ratio (ref 0.0–4.4)
Cholesterol, Total: 146 mg/dL (ref 100–199)
HDL: 62 mg/dL (ref >39)
LDL Chol Calc (NIH): 70 mg/dL (ref 0–99)
Triglycerides: 73 mg/dL (ref 0–149)
VLDL Cholesterol Cal: 14 mg/dL (ref 5–40)

## 2023-07-21 LAB — CBC WITH DIFFERENTIAL/PLATELET
Basophils Absolute: 0.1 10*3/uL (ref 0.0–0.2)
Basos: 2 %
EOS (ABSOLUTE): 0.1 10*3/uL (ref 0.0–0.4)
Eos: 1 %
Hematocrit: 32.1 % — ABNORMAL LOW (ref 34.0–46.6)
Hemoglobin: 10.3 g/dL — ABNORMAL LOW (ref 11.1–15.9)
Immature Grans (Abs): 0 10*3/uL (ref 0.0–0.1)
Immature Granulocytes: 0 %
Lymphocytes Absolute: 2.2 10*3/uL (ref 0.7–3.1)
Lymphs: 50 %
MCH: 32.8 pg (ref 26.6–33.0)
MCHC: 32.1 g/dL (ref 31.5–35.7)
MCV: 102 fL — ABNORMAL HIGH (ref 79–97)
Monocytes Absolute: 0.5 10*3/uL (ref 0.1–0.9)
Monocytes: 12 %
Neutrophils Absolute: 1.6 10*3/uL (ref 1.4–7.0)
Neutrophils: 35 %
Platelets: 359 10*3/uL (ref 150–450)
RBC: 3.14 x10E6/uL — ABNORMAL LOW (ref 3.77–5.28)
RDW: 14.4 % (ref 11.7–15.4)
WBC: 4.5 10*3/uL (ref 3.4–10.8)

## 2023-07-21 LAB — HEMOGLOBIN A1C
Est. average glucose Bld gHb Est-mCnc: 117 mg/dL
Hgb A1c MFr Bld: 5.7 % — ABNORMAL HIGH (ref 4.8–5.6)

## 2023-07-21 LAB — TSH+FREE T4
Free T4: 1.17 ng/dL (ref 0.82–1.77)
TSH: 1.65 u[IU]/mL (ref 0.450–4.500)

## 2023-08-07 ENCOUNTER — Ambulatory Visit: Payer: Self-pay | Admitting: Family Medicine

## 2023-09-03 ENCOUNTER — Inpatient Hospital Stay: Payer: 59 | Attending: Hematology and Oncology | Admitting: Hematology and Oncology

## 2023-09-03 VITALS — BP 139/55 | HR 81 | Temp 98.0°F | Resp 16 | Ht 62.0 in | Wt 159.2 lb

## 2023-09-03 DIAGNOSIS — C50412 Malignant neoplasm of upper-outer quadrant of left female breast: Secondary | ICD-10-CM | POA: Diagnosis not present

## 2023-09-03 DIAGNOSIS — Z9221 Personal history of antineoplastic chemotherapy: Secondary | ICD-10-CM | POA: Insufficient documentation

## 2023-09-03 DIAGNOSIS — Z853 Personal history of malignant neoplasm of breast: Secondary | ICD-10-CM | POA: Insufficient documentation

## 2023-09-03 DIAGNOSIS — Z171 Estrogen receptor negative status [ER-]: Secondary | ICD-10-CM | POA: Diagnosis not present

## 2023-09-03 DIAGNOSIS — Z923 Personal history of irradiation: Secondary | ICD-10-CM | POA: Insufficient documentation

## 2023-09-03 NOTE — Progress Notes (Signed)
 Patient Care Team: Zarwolo, Gloria, FNP as PCP - General (Family Medicine) Odean Potts, MD as Consulting Physician (Hematology and Oncology) Dewey Rush, MD as Consulting Physician (Radiation Oncology) Fernande Lamar LABOR, DPM as Referring Physician (Podiatry) Crawford, Morna Pickle, NP as Nurse Practitioner (Hematology and Oncology) Mammography, [facility] (Diagnostic Radiology)  DIAGNOSIS:  Encounter Diagnosis  Name Primary?   Malignant neoplasm of upper-outer quadrant of left breast in female, estrogen receptor negative (HCC) Yes    SUMMARY OF ONCOLOGIC HISTORY: Oncology History  Malignant neoplasm of upper-outer quadrant of left breast in female, estrogen receptor negative (HCC)  05/24/2021 Initial Diagnosis   Screening mammogram detected left UOQ mass 1.5 cm at 2 o'clock position, axilla negative, ultrasound biopsy: Grade 3 IDC ER 0%, PR 0%, HER2 equivocal, FISH pending, Ki-67 40%   05/29/2021 Cancer Staging   Staging form: Breast, AJCC 8th Edition - Clinical: Stage IB (cT1c, cN0, cM0, G3, ER-, PR-, HER2: Equivocal) - Signed by Odean Potts, MD on 05/29/2021 Stage prefix: Initial diagnosis Histologic grading system: 3 grade system   06/10/2021 Genetic Testing   Negative hereditary cancer genetic testing: no pathogenic variants detected in [facility] CustomNext-Cancer +RNAinsight Panel.  Report date is Jun 10, 2021.   The CustomNext-Cancer+RNAinsight panel offered by [facility] includes sequencing and rearrangement analysis for the following 47 genes:  APC, ATM, AXIN2, BARD1, BMPR1A, BRCA1, BRCA2, BRIP1, CDH1, CDK4, CDKN2A, CHEK2, DICER1, EPCAM, GREM1, HOXB13, MEN1, MLH1, MSH2, MSH3, MSH6, MUTYH, NBN, NF1, NF2, NTHL1, PALB2, PMS2, POLD1, POLE, PTEN, RAD51C, RAD51D, RECQL, RET, SDHA, SDHAF2, SDHB, SDHC, SDHD, SMAD4, SMARCA4, STK11, TP53, TSC1, TSC2, and VHL.  RNA data is routinely analyzed for use in variant interpretation for all genes.   06/26/2021 Surgery   Left Lumpectomy: Grade 3 IDC  1.5 cm, margins Neg, 0/2 LN Neg, ER 0%, PR 0%, Her 2 Neg, Ki 67: 40%   07/29/2021 - 10/03/2021 Chemotherapy   Patient is on Treatment Plan : BREAST TC q21d     11/05/2021 - 12/02/2021 Radiation Therapy   Adj XRT (Dr.Morrison in [place]) - Left breast  4272 cGy in 267 cGy daily fractions "Boost": 1000 cGy in 250 cGy daily fractions     CHIEF COMPLIANT: Surveillance of breast cancer  HISTORY OF PRESENT ILLNESS:   History of Present Illness Signatera for MRDMargaret CHRISTELLA Fleming is a 73 year old female who presents for a routine follow-up after breast cancer treatment.  She completed radiation therapy and her recent mammogram in April showed no abnormalities with breast density reported as B. She inquires about the continuation of biannual blood work performed at home. She maintains a healthy lifestyle with regular exercise, weight management, and avoidance of alcohol and smoking. She reports no pain or discomfort in her fingers and toes and has no new symptoms.     ALLERGIES:  is allergic to taxotere  [docetaxel ].  MEDICATIONS:  Current Outpatient Medications  Medication Sig Dispense Refill   alendronate  (FOSAMAX ) 70 MG tablet TAKE 1 TABLET BY MOUTH WEEKLY  WITH 8 OZ OF PLAIN WATER 30  MINUTES BEFORE FIRST FOOD, DRINK OR MEDS. STAY UPRIGHT FOR 30  MINS 12 tablet 3   lisinopril -hydrochlorothiazide  (ZESTORETIC ) 20-25 MG tablet TAKE 1 TABLET BY MOUTH DAILY 100 tablet 2   montelukast  (SINGULAIR ) 10 MG tablet TAKE 1 TABLET BY MOUTH AT  BEDTIME 100 tablet 2   naproxen  (NAPROSYN ) 500 MG tablet Take 1 tablet (500 mg total) by mouth 2 (two) times daily with a meal. 30 tablet 0   simvastatin  (ZOCOR ) 20  MG tablet TAKE 1 TABLET BY MOUTH IN THE  EVENING 100 tablet 2   No current facility-administered medications for this visit.    PHYSICAL EXAMINATION: ECOG PERFORMANCE STATUS: 1 - Symptomatic but completely ambulatory  Vitals:   09/03/23 0907  BP: (!) 139/55  Pulse: 81  Resp: 16  Temp: 98 F (36.7  C)  SpO2: 100%   Filed Weights   09/03/23 0907  Weight: 159 lb 3.2 oz (72.2 kg)    Physical Exam MEASUREMENTS: Weight- 159. No palpable lumps or nodules in bilateral breasts or axilla  (exam performed in the presence of a chaperone)  LABORATORY DATA:  I have reviewed the data as listed    Latest Ref Rng & Units 07/20/2023    8:33 AM 03/24/2023    8:11 AM 11/03/2022    8:36 AM  CMP  Glucose 70 - 99 mg/dL 86  92  85   BUN 8 - 27 mg/dL 19  28  15    Creatinine 0.57 - 1.00 mg/dL 9.10  8.83  9.06   Sodium 134 - 144 mmol/L 139  138  139   Potassium 3.5 - 5.2 mmol/L 4.4  4.5  4.5   Chloride 96 - 106 mmol/L 101  101  102   CO2 20 - 29 mmol/L 21  24  26    Calcium 8.7 - 10.3 mg/dL 9.5  9.0  9.7   Total Protein 6.0 - 8.5 g/dL 7.4  7.7  6.7   Total Bilirubin 0.0 - 1.2 mg/dL 1.0  0.4  0.8   Alkaline Phos 44 - 121 IU/L 40  138  40   AST 0 - 40 IU/L 15  42  22   ALT 0 - 32 IU/L 11  30  13      Lab Results  Component Value Date   WBC 4.5 07/20/2023   HGB 10.3 (L) 07/20/2023   HCT 32.1 (L) 07/20/2023   MCV 102 (H) 07/20/2023   PLT 359 07/20/2023   NEUTROABS 1.6 07/20/2023    ASSESSMENT & PLAN:  Malignant neoplasm of upper-outer quadrant of left breast in female, estrogen receptor negative (HCC) 05/24/2021:Screening mammogram detected left UOQ mass 1.5 cm at 2 o'clock position, axilla negative, ultrasound biopsy: Grade 3 IDC ER 0%, PR 0%, HER2 equivocal, FISH pending, Ki-67 40% 06/26/21: Left Lumpectomy: Grade 3 IDC 1.5 cm, margins Neg, 0/2 LN Neg, ER 0%, PR 0%, Her 2 Neg, Ki 67: 40%   Treatment Plan: 1. adjuvant chemotherapy with Taxotere  and Cytoxan  every 3 weeks x4 completed 07/29/2021 2.  Adjuvant radiation completed 12/02/2021 ---------------------------------------------------------------------------------------------------------------- Breast cancer surveillance: Breast exam 09/03/2023: Benign Mammogram 05/18/2023: [facility]: Benign breast density category B Signatera for MRD  monitoring: Negative   She is exercising regularly and feeling amazingly well.  No further issues from prior chemotherapy. Return to clinic in 1 year for follow-up     No orders of the defined types were placed in this encounter.  The patient has a good understanding of the overall plan. she agrees with it. she will call with any problems that may develop before the next visit here. Total time spent: 30 mins including face to face time and time spent for planning, charting and co-ordination of care   Viinay K Verdine Grenfell, MD 09/03/23

## 2023-09-03 NOTE — Assessment & Plan Note (Signed)
 05/24/2021:Screening mammogram detected left UOQ mass 1.5 cm at 2 o'clock position, axilla negative, ultrasound biopsy: Grade 3 IDC ER 0%, PR 0%, HER2 equivocal, FISH pending, Ki-67 40% 06/26/21: Left Lumpectomy: Grade 3 IDC 1.5 cm, margins Neg, 0/2 LN Neg, ER 0%, PR 0%, Her 2 Neg, Ki 67: 40%   Treatment Plan: 1. adjuvant chemotherapy with Taxotere  and Cytoxan  every 3 weeks x4 completed 07/29/2021 2.  Adjuvant radiation completed 12/02/2021 ---------------------------------------------------------------------------------------------------------------- Breast cancer surveillance: Breast exam 09/03/2023: Benign Mammogram 05/18/2023: [facility]: Benign breast density category B   Return to clinic in 1 year for follow-up

## 2023-09-04 ENCOUNTER — Telehealth: Payer: Self-pay

## 2023-09-04 NOTE — Telephone Encounter (Signed)
 Copied from CRM [id number]. Topic: Clinical - Medication Question >> Sep 04, 2023  8:16 AM Everette C wrote: Reason for CRM: The patient would like to be contacted by a member of clinical staff to discuss medication cessation and their cholesterol. Please contact further if/when possible

## 2023-09-08 NOTE — Telephone Encounter (Signed)
 Mailbox full

## 2023-10-18 ENCOUNTER — Other Ambulatory Visit: Payer: Self-pay | Admitting: Family Medicine

## 2023-10-18 DIAGNOSIS — M810 Age-related osteoporosis without current pathological fracture: Secondary | ICD-10-CM

## 2023-11-16 ENCOUNTER — Encounter: Payer: Self-pay | Admitting: Family Medicine

## 2023-11-16 ENCOUNTER — Ambulatory Visit: Admitting: Family Medicine

## 2023-11-16 VITALS — BP 116/74 | HR 86 | Resp 18 | Ht 62.0 in | Wt 160.0 lb

## 2023-11-16 DIAGNOSIS — I10 Essential (primary) hypertension: Secondary | ICD-10-CM | POA: Diagnosis not present

## 2023-11-16 DIAGNOSIS — E7849 Other hyperlipidemia: Secondary | ICD-10-CM

## 2023-11-16 DIAGNOSIS — R7303 Prediabetes: Secondary | ICD-10-CM | POA: Diagnosis not present

## 2023-11-16 DIAGNOSIS — Z23 Encounter for immunization: Secondary | ICD-10-CM

## 2023-11-16 NOTE — Patient Instructions (Signed)
 I appreciate the opportunity to provide care to you today!    Follow up:  4 months  Labs:  next visit  For a Healthier YOU, I Recommend: Reducing your intake of sugar, sodium, carbohydrates, and saturated fats. Increasing your fiber intake by incorporating more whole grains, fruits, and vegetables into your meals. Setting healthy goals with a focus on lowering your consumption of carbs, sugar, and unhealthy fats. Adding variety to your diet by including a wide range of fruits and vegetables. Cutting back on soda and limiting processed foods as much as possible. Staying active: In addition to taking your weight loss medication, aim for at least 150 minutes of moderate-intensity physical activity each week for optimal results.    Please follow up if your symptoms worsen or fail to improve.     Please continue to a heart-healthy diet and increase your physical activities. Try to exercise for at least five days a week.    It was a pleasure to see you and I look forward to continuing to work together on your health and well-being. Please do not hesitate to call the office if you need care or have questions about your care.  In case of emergency, please visit the Emergency Department for urgent care, or contact our clinic at [phone] to schedule an appointment. We're here to help you!   Have a wonderful day and week. With Gratitude, Meade JENEANE Gerlach MSN, FNP-BC, PMHNP-BC

## 2023-11-16 NOTE — Assessment & Plan Note (Signed)
 Lifestyle modifications for prediabetes were discussed, including adopting a heart-healthy diet and increasing physical activity. The patient was encouraged to decrease her intake of high-sugar foods and beverages. She verbalized understanding and is aware of the plan of care.

## 2023-11-16 NOTE — Assessment & Plan Note (Signed)
 Controlled Stable on lisinopril -hydrochlorothiazide  20-25 milligrams daily Asymptomatic in the clinic A low-sodium diet of less than 2300 mg daily is recommended, along with increased physical activity of moderate intensity, aiming for 150 minutes weekly. The patient is encouraged to continue with these lifestyle modifications to help manage their blood pressure effectively.  BP Readings from Last 3 Encounters:  11/16/23 116/74  09/03/23 (!) 139/55  07/16/23 109/64

## 2023-11-16 NOTE — Progress Notes (Signed)
 Established Patient Office Visit  Subjective:  Patient ID: Sheri Fleming, female    DOB: 02-22-1950  Age: 73 y.o. MRN: [id number]  CC:  Chief Complaint  Patient presents with   Hypertension    Follow up     HPI Sheri Fleming is a 73 y.o. female with past medical history of  Hypertension, Asthma, prediabetes and hyperlipidemia presents for f/u of  chronic medical conditions.  For the details of today's visit, please refer to the assessment and plan.     Past Medical History:  Diagnosis Date   Arthritis    Asthma    Cancer (HCC) 05/22/2021   breast cancer   High cholesterol    Hypertension    Port-A-Cath in place 08/12/2021   Sleep apnea     Past Surgical History:  Procedure Laterality Date   ABDOMINAL HYSTERECTOMY     BREAST LUMPECTOMY WITH RADIOACTIVE SEED AND SENTINEL LYMPH NODE BIOPSY Left 06/26/2021   Procedure: LEFT BREAST LUMPECTOMY WITH RADIOACTIVE SEED AND SENTINEL LYMPH NODE BIOPSY;  Surgeon: Curvin Deward MOULD, MD;  Location: [facility] OR;  Service: General;  Laterality: Left;  GEN & PEC BLOCK   FRACTURE SURGERY Left    left lower leg   PORT-A-CATH REMOVAL Right 12/20/2021   Procedure: REMOVAL PORT-A-CATH;  Surgeon: Curvin Deward MOULD, MD;  Location: [facility];  Service: General;  Laterality: Right;   PORTACATH PLACEMENT N/A 06/26/2021   Procedure: PORT PLACEMENT;  Surgeon: Curvin Deward MOULD, MD;  Location: [facility] OR;  Service: General;  Laterality: N/A;    Family History  Problem Relation Age of Onset   Stroke Mother    Hypertension Mother    Lung cancer Father        d. 60s   Vaginal cancer Sister 71   Cancer Brother        unknown type; dx 72s; mets   Hypertension Son    Stroke Son    Kidney failure Son    Hypercholesterolemia Son    Stroke Other    Colon cancer Neg Hx    Breast cancer Neg Hx     Social History   Socioeconomic History   Marital status: Divorced    Spouse name: Not on file   Number of children: 4   Years of education: Not on  file   Highest education level: Not on file  Occupational History   Not on file  Tobacco Use   Smoking status: Never   Smokeless tobacco: Never  Vaping Use   Vaping status: Never Used  Substance and Sexual Activity   Alcohol use: No   Drug use: No   Sexual activity: Not Currently  Other Topics Concern   Not on file  Social History Narrative   Lives with her ex husband. Has 4 children. Retired.    Social Drivers of Corporate investment banker Strain: Low Risk  (03/24/2023)   Overall Financial Resource Strain (CARDIA)    Difficulty of Paying Living Expenses: Not hard at all  Food Insecurity: No Food Insecurity (03/24/2023)   Hunger Vital Sign    Worried About Running Out of Food in the Last Year: Never true    Ran Out of Food in the Last Year: Never true  Transportation Needs: No Transportation Needs (03/24/2023)   PRAPARE - Administrator, Civil Service (Medical): No    Lack of Transportation (Non-Medical): No  Physical Activity: Sufficiently Active (03/24/2023)   Exercise Vital Sign  Days of Exercise per Week: 7 days    Minutes of Exercise per Session: 30 min  Stress: No Stress Concern Present (03/24/2023)   [employer] of Occupational Health - Occupational Stress Questionnaire    Feeling of Stress : Not at all  Social Connections: Moderately Isolated (03/24/2023)   Social Connection and Isolation Panel    Frequency of Communication with Friends and Family: More than three times a week    Frequency of Social Gatherings with Friends and Family: More than three times a week    Attends Religious Services: More than 4 times per year    Active Member of [employer] or Organizations: No    Attends Banker Meetings: Never    Marital Status: Divorced  Catering manager Violence: Not At Risk (03/24/2023)   Humiliation, Afraid, Rape, and Kick questionnaire    Fear of Current or Ex-Partner: No    Emotionally Abused: No    Physically Abused: No    Sexually  Abused: No    Outpatient Medications Prior to Visit  Medication Sig Dispense Refill   alendronate  (FOSAMAX ) 70 MG tablet TAKE 1 TABLET BY MOUTH WEEKLY  WITH 8 OZ OF PLAIN WATER 30  MINUTES BEFORE FIRST FOOD, DRINK OR MEDS. STAY UPRIGHT FOR 30  MINS 12 tablet 3   lisinopril -hydrochlorothiazide  (ZESTORETIC ) 20-25 MG tablet TAKE 1 TABLET BY MOUTH DAILY 100 tablet 2   montelukast  (SINGULAIR ) 10 MG tablet TAKE 1 TABLET BY MOUTH AT  BEDTIME 100 tablet 2   naproxen  (NAPROSYN ) 500 MG tablet Take 1 tablet (500 mg total) by mouth 2 (two) times daily with a meal. 30 tablet 0   simvastatin  (ZOCOR ) 20 MG tablet TAKE 1 TABLET BY MOUTH IN THE  EVENING 100 tablet 2   No facility-administered medications prior to visit.    Allergies  Allergen Reactions   Taxotere  [Docetaxel ] Other (See Comments)    Pt had hypersensitivity rxn to Docetaxel . See progress note from 07/29/21 at 1419. Infusion discontinued.     ROS Review of Systems  Constitutional:  Negative for chills and fever.  Eyes:  Negative for visual disturbance.  Respiratory:  Negative for chest tightness and shortness of breath.   Neurological:  Negative for dizziness and headaches.      Objective:    Physical Exam HENT:     Head: Normocephalic.     Mouth/Throat:     Mouth: Mucous membranes are moist.  Cardiovascular:     Rate and Rhythm: Normal rate.     Heart sounds: Normal heart sounds.  Pulmonary:     Effort: Pulmonary effort is normal.     Breath sounds: Normal breath sounds.  Neurological:     Mental Status: She is alert.     BP 116/74   Pulse 86   Resp 18   Ht 5' 2 (1.575 m)   Wt 160 lb 0.6 oz (72.6 kg)   SpO2 95%   BMI 29.27 kg/m  Wt Readings from Last 3 Encounters:  11/16/23 160 lb 0.6 oz (72.6 kg)  09/03/23 159 lb 3.2 oz (72.2 kg)  07/16/23 159 lb 1.9 oz (72.2 kg)    Lab Results  Component Value Date   TSH 1.650 07/20/2023   Lab Results  Component Value Date   WBC 4.5 07/20/2023   HGB 10.3 (L)  07/20/2023   HCT 32.1 (L) 07/20/2023   MCV 102 (H) 07/20/2023   PLT 359 07/20/2023   Lab Results  Component Value Date   NA 139  07/20/2023   K 4.4 07/20/2023   CO2 21 07/20/2023   GLUCOSE 86 07/20/2023   BUN 19 07/20/2023   CREATININE 0.89 07/20/2023   BILITOT 1.0 07/20/2023   ALKPHOS 40 (L) 07/20/2023   AST 15 07/20/2023   ALT 11 07/20/2023   PROT 7.4 07/20/2023   ALBUMIN 4.7 07/20/2023   CALCIUM 9.5 07/20/2023   ANIONGAP 13 12/18/2021   EGFR 68 07/20/2023   Lab Results  Component Value Date   CHOL 146 07/20/2023   Lab Results  Component Value Date   HDL 62 07/20/2023   Lab Results  Component Value Date   LDLCALC 70 07/20/2023   Lab Results  Component Value Date   TRIG 73 07/20/2023   Lab Results  Component Value Date   CHOLHDL 2.4 07/20/2023   Lab Results  Component Value Date   HGBA1C 5.7 (H) 07/20/2023      Assessment & Plan:  Primary hypertension Assessment & Plan: Controlled Stable on lisinopril -hydrochlorothiazide  20-25 milligrams daily Asymptomatic in the clinic A low-sodium diet of less than 2300 mg daily is recommended, along with increased physical activity of moderate intensity, aiming for 150 minutes weekly. The patient is encouraged to continue with these lifestyle modifications to help manage their blood pressure effectively.  BP Readings from Last 3 Encounters:  11/16/23 116/74  09/03/23 (!) 139/55  07/16/23 109/64      Other hyperlipidemia Assessment & Plan: The patient is encouraged to continue taking simvastatin  20 mg daily. Additionally, I recommend decreasing her intake of greasy, starchy, and fatty foods while increasing physical activity to support her overall health. Lab Results  Component Value Date   CHOL 146 07/20/2023   HDL 62 07/20/2023   LDLCALC 70 07/20/2023   TRIG 73 07/20/2023   CHOLHDL 2.4 07/20/2023      Prediabetes Assessment & Plan: Lifestyle modifications for prediabetes were discussed, including  adopting a heart-healthy diet and increasing physical activity. The patient was encouraged to decrease her intake of high-sugar foods and beverages. She verbalized understanding and is aware of the plan of care.    Note: This chart has been completed using Engineer, civil (consulting) software, and while attempts have been made to ensure accuracy, certain words and phrases may not be transcribed as intended.    Follow-up: No follow-ups on file.   Holger Sokolowski  Z Bacchus, FNP

## 2023-11-16 NOTE — Assessment & Plan Note (Signed)
 The patient is encouraged to continue taking simvastatin  20 mg daily. Additionally, I recommend decreasing her intake of greasy, starchy, and fatty foods while increasing physical activity to support her overall health. Lab Results  Component Value Date   CHOL 146 07/20/2023   HDL 62 07/20/2023   LDLCALC 70 07/20/2023   TRIG 73 07/20/2023   CHOLHDL 2.4 07/20/2023

## 2023-12-07 ENCOUNTER — Other Ambulatory Visit: Payer: Self-pay | Admitting: Family Medicine

## 2023-12-07 DIAGNOSIS — J4520 Mild intermittent asthma, uncomplicated: Secondary | ICD-10-CM

## 2023-12-20 LAB — SIGNATERA
SIGNATERA MTM READOUT: 0 MTM/ml
SIGNATERA TEST RESULT: NEGATIVE

## 2024-01-02 ENCOUNTER — Encounter (HOSPITAL_COMMUNITY): Payer: Self-pay

## 2024-01-02 ENCOUNTER — Emergency Department (HOSPITAL_COMMUNITY)
Admission: EM | Admit: 2024-01-02 | Discharge: 2024-01-02 | Disposition: A | Discharge status: Home / Self Care | Attending: Emergency Medicine | Admitting: Emergency Medicine

## 2024-01-02 DIAGNOSIS — Z7951 Long term (current) use of inhaled steroids: Secondary | ICD-10-CM | POA: Diagnosis not present

## 2024-01-02 DIAGNOSIS — J45909 Unspecified asthma, uncomplicated: Secondary | ICD-10-CM | POA: Insufficient documentation

## 2024-01-02 DIAGNOSIS — J069 Acute upper respiratory infection, unspecified: Secondary | ICD-10-CM | POA: Insufficient documentation

## 2024-01-02 DIAGNOSIS — R059 Cough, unspecified: Secondary | ICD-10-CM | POA: Diagnosis present

## 2024-01-02 MED ORDER — BENZONATATE 100 MG PO CAPS: 200.0000 mg | ORAL_CAPSULE | Freq: Two times a day (BID) | ORAL | 0 refills | Status: DC | PRN

## 2024-01-02 MED ORDER — BENZONATATE 100 MG PO CAPS
200.0000 mg | ORAL_CAPSULE | Freq: Once | ORAL | Status: AC
  Administered 2024-01-02: 200 mg via ORAL
  Filled 2024-01-02: qty 2

## 2024-01-02 MED ORDER — ALBUTEROL SULFATE HFA 108 (90 BASE) MCG/ACT IN AERS: 2.0000 | INHALATION_SPRAY | RESPIRATORY_TRACT | 3 refills | Status: AC | PRN

## 2024-01-02 MED ORDER — PREDNISONE 20 MG PO TABS: 40.0000 mg | ORAL_TABLET | Freq: Every day | ORAL | 0 refills | Status: DC

## 2024-01-02 NOTE — Discharge Instructions (Signed)
 You can take the albuterol  inhaler every 4 hours as needed, Tessalon  every 8 hours as needed for coughing, Flonase  nasal spray for nose symptoms such as runny nose or congestion  Tessalon  is a cough medication that helps reduce the amount of coughing that you are having.  You may take up to 200 mg every 8 hours as needed.  This can be used safely with most or other over-the-counter medications but talk to your pharmacist before taking anything else over-the-counter with it  Albuterol  is an inhaled medication which can help you to breathe better, you should take 2 puffs every 4 hours as needed, this may cause your heart to feel like it is racing, this should be a temporary side effect.   Prednisone  is a steroid that helps to reduce certain types of inflammation and may be used for allergic reactions, some rashes such as poison ivy or dermatitis, for asthma attacks or bronchitis and for certain types of pain.  Please take this medicine exactly as prescribed - 40mg  by mouth daily for 5 days.  This can have certain side effects with some people including feeling like you can't sleep, feeling anxious or feeling like you are on a high.  It should not cause weight gain if only taken for a short time.  Please be aware that this medication may also cause an elevation in your blood sugar if you are a diabetic so if you are a diabetic you will need to keep a very close eye on your blood sugar, make sure that you are eating an extremely low level of carbohydrates and taking your medications exactly as prescribed.  If you should develop severe high blood sugar or start to feel poorly return to the emergency department immediately   I would also recommend that you pick up Coricidin which is a cough and cold medication safe for high blood pressure  Thank you for allowing us  to treat you in the emergency department today.  After reviewing your examination and potential testing that was done it appears that you are safe  to go home.  I would like for you to follow-up with your doctor within the next several days, have them obtain your records and follow-up with them to review all potential tests and results from your visit.  If you should develop severe or worsening symptoms return to the emergency department immediately

## 2024-01-02 NOTE — ED Triage Notes (Signed)
 Pt comes in for SOB and a productive cough with yellow/white phlegm. Pt has tried OTC cough meds. Pt's temper was 99.8 at home.  A&Ox4.

## 2024-01-02 NOTE — ED Notes (Signed)
 Pt completely assessed by EDP and set to discharge. No RN assessment performed.  Pt provided discharge instructions and prescription information. Pt was given the opportunity to ask questions and questions were answered.

## 2024-01-02 NOTE — ED Provider Notes (Signed)
 [name] AT [facility] Provider Note   CSN: [id number] Arrival date & time: 01/02/24  1529     Patient presents with: Shortness of Breath and Cough (productive)   Sheri Fleming is a 73 y.o. female.    Shortness of Breath Associated symptoms: cough   Cough Associated symptoms: shortness of breath    This patient is a 73 year old female, she has a history of asthma going back many years.  She presents with a complaint of having approximately 1 to 2 days of upper respiratory symptoms including runny nose and a bit of a cough.  She has asthma and unfortunately she has felt some increasing shortness of breath with her asthma.  She denies having fevers vomiting or diarrhea, she does not have a sore throat.  She denies being around anybody who has been sick, she states that she wants something to help with the cough and the runny nose.  She has had a little bit of phlegm which she describes as white    Prior to Admission medications   Medication Sig Start Date End Date Taking? Authorizing Provider  albuterol  (VENTOLIN  HFA) 108 (90 Base) MCG/ACT inhaler Inhale 2 puffs into the lungs every 4 (four) hours as needed for wheezing or shortness of breath. 01/02/24  Yes Cleotilde Rogue, MD  benzonatate  (TESSALON ) 100 MG capsule Take 2 capsules (200 mg total) by mouth 2 (two) times daily as needed for cough. 01/02/24  Yes Cleotilde Rogue, MD  montelukast  (SINGULAIR ) 10 MG tablet TAKE 1 TABLET BY MOUTH AT  BEDTIME 12/08/23  Yes Bacchus, Gloria Z, FNP  predniSONE  (DELTASONE ) 20 MG tablet Take 2 tablets (40 mg total) by mouth daily. 01/02/24  Yes Cleotilde Rogue, MD  alendronate  (FOSAMAX ) 70 MG tablet TAKE 1 TABLET BY MOUTH WEEKLY  WITH 8 OZ OF PLAIN WATER 30  MINUTES BEFORE FIRST FOOD, DRINK OR MEDS. STAY UPRIGHT FOR 30  MINS 10/19/23   Bacchus, Gloria Z, FNP  lisinopril -hydrochlorothiazide  (ZESTORETIC ) 20-25 MG tablet TAKE 1 TABLET BY MOUTH DAILY 06/01/23   Bacchus, Gloria Z,  FNP  naproxen  (NAPROSYN ) 500 MG tablet Take 1 tablet (500 mg total) by mouth 2 (two) times daily with a meal. 05/31/22   Cleotilde Rogue, MD  simvastatin  (ZOCOR ) 20 MG tablet TAKE 1 TABLET BY MOUTH IN THE  EVENING 05/11/23   Bacchus, Meade PEDLAR, FNP    Allergies: Taxotere  [docetaxel ]    Review of Systems  Respiratory:  Positive for cough and shortness of breath.   All other systems reviewed and are negative.   Updated Vital Signs BP (!) 161/69 (BP Location: Right Arm)   Pulse 83   Temp 99 F (37.2 C) (Oral)   Resp 20   Ht 1.575 m (5' 2)   Wt 73.4 kg   SpO2 100%   BMI 29.61 kg/m   Physical Exam Vitals and nursing note reviewed.  Constitutional:      General: She is not in acute distress.    Appearance: She is well-developed.  HENT:     Head: Normocephalic and atraumatic.     Mouth/Throat:     Pharynx: No oropharyngeal exudate.  Eyes:     General: No scleral icterus.       Right eye: No discharge.        Left eye: No discharge.     Conjunctiva/sclera: Conjunctivae normal.     Pupils: Pupils are equal, round, and reactive to light.  Neck:     Thyroid : No  thyromegaly.     Vascular: No JVD.  Cardiovascular:     Rate and Rhythm: Normal rate and regular rhythm.     Heart sounds: Normal heart sounds. No murmur heard.    No friction rub. No gallop.  Pulmonary:     Effort: Pulmonary effort is normal. No respiratory distress.     Breath sounds: Normal breath sounds. No wheezing or rales.  Abdominal:     General: Bowel sounds are normal. There is no distension.     Palpations: Abdomen is soft. There is no mass.     Tenderness: There is no abdominal tenderness.  Musculoskeletal:        General: No tenderness. Normal range of motion.     Cervical back: Normal range of motion and neck supple.     Right lower leg: No tenderness. No edema.     Left lower leg: No tenderness. No edema.  Lymphadenopathy:     Cervical: No cervical adenopathy.  Skin:    General: Skin is warm and dry.      Findings: No erythema or rash.  Neurological:     Mental Status: She is alert.     Coordination: Coordination normal.  Psychiatric:        Behavior: Behavior normal.     (all labs ordered are listed, but only abnormal results are displayed) Labs Reviewed - No data to display  EKG: None  Radiology: No results found.   Procedures   Medications Ordered in the ED  benzonatate  (TESSALON ) capsule 200 mg (has no administration in time range)                                    Medical Decision Making Risk Prescription drug management.   The patient lungs are totally clear, her cardiac exam is unremarkable, she is afebrile and she is not hypoxic.  Recommended over-the-counter cough and cold medications such as Coricidin, recommended albuterol  inhaler prednisone  and Tessalon , she was given Tessalon  prior to discharge.  She has no signs of sepsis, no findings of pneumonia, she is not even wheezing at this time.     Final diagnoses:  Viral upper respiratory illness    ED Discharge Orders          Ordered    albuterol  (VENTOLIN  HFA) 108 (90 Base) MCG/ACT inhaler  Every 4 hours PRN        01/02/24 1612    predniSONE  (DELTASONE ) 20 MG tablet  Daily        01/02/24 1612    benzonatate  (TESSALON ) 100 MG capsule  2 times daily PRN        01/02/24 1612               Cleotilde Rogue, MD 01/02/24 1614

## 2024-01-08 ENCOUNTER — Encounter (HOSPITAL_COMMUNITY): Payer: Self-pay | Admitting: Emergency Medicine

## 2024-01-08 ENCOUNTER — Emergency Department (HOSPITAL_COMMUNITY)

## 2024-01-08 ENCOUNTER — Telehealth: Payer: Self-pay | Admitting: Family Medicine

## 2024-01-08 ENCOUNTER — Emergency Department (HOSPITAL_COMMUNITY): Admission: EM | Admit: 2024-01-08 | Discharge: 2024-01-08 | Disposition: A | Discharge status: Home / Self Care

## 2024-01-08 DIAGNOSIS — R051 Acute cough: Secondary | ICD-10-CM

## 2024-01-08 MED ORDER — BENZONATATE 200 MG PO CAPS: 200.0000 mg | ORAL_CAPSULE | Freq: Three times a day (TID) | ORAL | 0 refills | Status: DC | PRN

## 2024-01-08 MED ORDER — FLUTICASONE PROPIONATE 50 MCG/ACT NA SUSP: 2.0000 | Freq: Every day | NASAL | 1 refills | Status: DC

## 2024-01-08 NOTE — Telephone Encounter (Signed)
 Called pt 12/5 . Mailbox is full

## 2024-01-08 NOTE — ED Triage Notes (Signed)
 Pt here from home with c/o continued cough , no fevers , was seen for same about 1 week ago , pt is on an ace inhibitor but has been years

## 2024-01-08 NOTE — Discharge Instructions (Addendum)
 Try the nasal spray as directed.  I have refilled your Tessalon  to help with your cough.  Your cough may be coming from your blood pressure medication.  This is a common side effect.  If your symptoms are not improving in the next few days, I recommend that you follow-up with your primary care provider for recheck.  Return to the emergency department if you develop any new or worsening symptoms

## 2024-01-08 NOTE — ED Provider Notes (Signed)
 [place] EMERGENCY DEPARTMENT AT [facility] Provider Note   CSN: [id number] Arrival date & time: 01/08/24  0945     Patient presents with: Cough   Sheri Fleming is a 73 y.o. female.  {Add pertinent medical, surgical, social history, OB history to HPI:32947}  Cough Associated symptoms: rhinorrhea   Associated symptoms: no chest pain, no chills, no fever, no headaches, no myalgias, no shortness of breath and no sore throat        Sheri Fleming is a 73 y.o. female past medical history of asthma, hypertension, breast cancer, prediabetes who presents to the Emergency Department complaining of persistent cough.  She was seen here 1 week ago for same.  She completed a course of prednisone , Tessalon  pearls and was given albuterol  inhaler.  She states that she ran out of the Tessalon  Perles while she was taking them she states that her cough improved.  She has also had some nasal congestion and rhinorrhea.  She describes a tickle  that triggers her cough.  States her cough is worse at night, mostly nonproductive.  She denies any hemoptysis, chest pain or shortness of breath.  No fever or chills.  Prior to Admission medications   Medication Sig Start Date End Date Taking? Authorizing Provider  albuterol  (VENTOLIN  HFA) 108 (90 Base) MCG/ACT inhaler Inhale 2 puffs into the lungs every 4 (four) hours as needed for wheezing or shortness of breath. 01/02/24   Cleotilde Rogue, MD  alendronate  (FOSAMAX ) 70 MG tablet TAKE 1 TABLET BY MOUTH WEEKLY  WITH 8 OZ OF PLAIN WATER 30  MINUTES BEFORE FIRST FOOD, DRINK OR MEDS. STAY UPRIGHT FOR 30  MINS 10/19/23   Bacchus, Meade PEDLAR, FNP  benzonatate  (TESSALON ) 100 MG capsule Take 2 capsules (200 mg total) by mouth 2 (two) times daily as needed for cough. 01/02/24   Cleotilde Rogue, MD  lisinopril -hydrochlorothiazide  (ZESTORETIC ) 20-25 MG tablet TAKE 1 TABLET BY MOUTH DAILY 06/01/23   Bacchus, Gloria Z, FNP  montelukast  (SINGULAIR ) 10 MG tablet TAKE 1  TABLET BY MOUTH AT  BEDTIME 12/08/23   Bacchus, Gloria Z, FNP  naproxen  (NAPROSYN ) 500 MG tablet Take 1 tablet (500 mg total) by mouth 2 (two) times daily with a meal. 05/31/22   Cleotilde Rogue, MD  predniSONE  (DELTASONE ) 20 MG tablet Take 2 tablets (40 mg total) by mouth daily. 01/02/24   Cleotilde Rogue, MD  simvastatin  (ZOCOR ) 20 MG tablet TAKE 1 TABLET BY MOUTH IN THE  EVENING 05/11/23   Bacchus, Meade PEDLAR, FNP    Allergies: Taxotere  [docetaxel ]    Review of Systems  Constitutional:  Negative for appetite change, chills and fever.  HENT:  Positive for congestion and rhinorrhea. Negative for sinus pressure, sinus pain and sore throat.   Respiratory:  Positive for cough. Negative for shortness of breath.   Cardiovascular:  Negative for chest pain.  Gastrointestinal:  Negative for abdominal pain, nausea and vomiting.  Musculoskeletal:  Negative for arthralgias, myalgias, neck pain and neck stiffness.  Neurological:  Negative for dizziness, weakness, numbness and headaches.  Psychiatric/Behavioral:  Negative for confusion.     Updated Vital Signs BP (!) 145/81   Pulse 92   Temp 98.1 F (36.7 C)   Resp 20   SpO2 99%   Physical Exam Vitals and nursing note reviewed.  Constitutional:      General: She is not in acute distress.    Appearance: Normal appearance. She is not toxic-appearing.  HENT:     Right Ear:  Tympanic membrane and ear canal normal.     Left Ear: Tympanic membrane and ear canal normal.     Mouth/Throat:     Mouth: Mucous membranes are moist.     Pharynx: Oropharynx is clear. No posterior oropharyngeal erythema.  Cardiovascular:     Rate and Rhythm: Normal rate and regular rhythm.     Pulses: Normal pulses.  Pulmonary:     Effort: Pulmonary effort is normal.  Abdominal:     Palpations: Abdomen is soft.     Tenderness: There is no abdominal tenderness.  Musculoskeletal:     Cervical back: Normal range of motion.     Right lower leg: No edema.     Left lower leg: No  edema.  Lymphadenopathy:     Cervical: No cervical adenopathy.  Skin:    General: Skin is warm.     Capillary Refill: Capillary refill takes less than 2 seconds.  Neurological:     General: No focal deficit present.     Mental Status: She is alert.     Sensory: No sensory deficit.     Motor: No weakness.     (all labs ordered are listed, but only abnormal results are displayed) Labs Reviewed - No data to display  EKG: None  Radiology: DG Chest Portable 1 View Result Date: 01/08/2024 CLINICAL DATA:  Cough EXAM: PORTABLE CHEST 1 VIEW COMPARISON:  Chest radiograph dated 06/26/2021 FINDINGS: Normal lung volumes. No focal consolidations. No pleural effusion or pneumothorax. The heart size and mediastinal contours are within normal limits. No acute osseous abnormality. Left axillary surgical clips. IMPRESSION: No active disease. Electronically Signed   By: Limin  Xu M.D.   On: 01/08/2024 11:42    {Document cardiac monitor, telemetry assessment procedure when appropriate:32947} Procedures   Medications Ordered in the ED - No data to display    {Click here for ABCD2, HEART and other calculators REFRESH Note before signing:1}                              Medical Decision Making   Patient was seen here on 01/02/2024 for similar symptoms.  She was treated with prednisone , Tessalon  Perles and albuterol  MDI.  She completed a course of Tessalon  and prednisone  without improvement.  She states that she ran out of Tessalon  but the medication was helping her cough.  Cough is mostly nonproductive and worse at night when lying down. She denies any fever, chills, peripheral edema.   Pt is very well appearing.  Her vitals are reassuring.  Diff dx includes persistent viral process, post nasal drip, cough secondary to ACE inhibitor, PNA, she denies CP, SOB and vitals area reassuring, so I feel that ACS or PE is less likely   Amount and/or Complexity of Data Reviewed Radiology: ordered.    Details:  CXR without acute cardiopulmonary process Discussion of management or test interpretation with external provider(s):  Patient very well-appearing.  Vital signs reassuring.  I do not feel additional prednisone  is indicated at this time.  No fever or productive cough to suggest need for antibiotics.  Her cough may be related to her ACE inhibitor use versus postnasal drip.  Will have her start Flonase  and I will refill her Tessalon .  I recommended close outpatient follow-up with her PCP as she may need to change her antihypertensive medications if her cough is not improving with recommended therapy     {Document critical care time when appropriate  Document review of labs and clinical decision tools ie CHADS2VASC2, etc  Document your independent review of radiology images and any outside records  Document your discussion with family members, caretakers and with consultants  Document social determinants of health affecting pt's care  Document your decision making why or why not admission, treatments were needed:32947:::1}   Final diagnoses:  None    ED Discharge Orders     None

## 2024-01-08 NOTE — Telephone Encounter (Signed)
 Copied from CRM [id number]. Topic: Appointments - Scheduling Inquiry for Clinic >> Jan 08, 2024  2:23 PM Larissa S wrote: Reason for CRM: Patient requesting a ER f/u appointment. Schedule shows no availability for any provider until Jan. 2nd. Patient prefers to see her PCP.  Please contact for scheduling.

## 2024-01-12 ENCOUNTER — Other Ambulatory Visit: Payer: Self-pay | Admitting: Family Medicine

## 2024-01-12 DIAGNOSIS — E785 Hyperlipidemia, unspecified: Secondary | ICD-10-CM

## 2024-01-14 ENCOUNTER — Emergency Department (HOSPITAL_COMMUNITY): Admission: EM | Admit: 2024-01-14 | Discharge: 2024-01-14 | Disposition: A | Discharge status: Home / Self Care

## 2024-01-14 ENCOUNTER — Other Ambulatory Visit: Payer: Self-pay

## 2024-01-14 ENCOUNTER — Encounter (HOSPITAL_COMMUNITY): Payer: Self-pay

## 2024-01-14 DIAGNOSIS — I10 Essential (primary) hypertension: Secondary | ICD-10-CM | POA: Diagnosis not present

## 2024-01-14 DIAGNOSIS — Z79899 Other long term (current) drug therapy: Secondary | ICD-10-CM | POA: Diagnosis not present

## 2024-01-14 DIAGNOSIS — Z853 Personal history of malignant neoplasm of breast: Secondary | ICD-10-CM | POA: Insufficient documentation

## 2024-01-14 DIAGNOSIS — R051 Acute cough: Secondary | ICD-10-CM | POA: Insufficient documentation

## 2024-01-14 DIAGNOSIS — J45909 Unspecified asthma, uncomplicated: Secondary | ICD-10-CM | POA: Diagnosis not present

## 2024-01-14 MED ORDER — DOXYCYCLINE HYCLATE 100 MG PO CAPS: 100.0000 mg | ORAL_CAPSULE | Freq: Two times a day (BID) | ORAL | 0 refills | Status: DC

## 2024-01-14 MED ORDER — GUAIFENESIN-CODEINE 100-10 MG/5ML PO SOLN: 10.0000 mL | Freq: Four times a day (QID) | ORAL | 0 refills | Status: DC | PRN

## 2024-01-14 NOTE — ED Provider Notes (Signed)
 [place] EMERGENCY DEPARTMENT AT [facility] Provider Note   CSN: [id number] Arrival date & time: 01/14/24  1449     Patient presents with: Cough   Sheri Fleming is a 73 y.o. female.    Cough       Sheri Fleming is a 73 y.o. female past medical history of prediabetes, asthma, hypertension, breast cancer and followed by oncology who presents to the Emergency Department complaining of persistent cough.  She is had multiple ER visits for the same.  She states that she has tried to follow-up with her primary care but has been unable to get a appointment.  She has had testing for flu and had a chest x-ray 6 days ago without evidence of pneumonia.  She has been taking Tessalon  Perles which helped initially but no longer helpful.  Coughing is keeping her awake at night.  She denies any new or worsening symptoms.  No chest pain or shortness of breath.  She denies any peripheral edema fever or chills.  No hemoptysis.    Prior to Admission medications  Medication Sig Start Date End Date Taking? Authorizing Provider  albuterol  (VENTOLIN  HFA) 108 (90 Base) MCG/ACT inhaler Inhale 2 puffs into the lungs every 4 (four) hours as needed for wheezing or shortness of breath. 01/02/24   Cleotilde Rogue, MD  alendronate  (FOSAMAX ) 70 MG tablet TAKE 1 TABLET BY MOUTH WEEKLY  WITH 8 OZ OF PLAIN WATER 30  MINUTES BEFORE FIRST FOOD, DRINK OR MEDS. STAY UPRIGHT FOR 30  MINS 10/19/23   Bacchus, Meade PEDLAR, FNP  benzonatate  (TESSALON ) 200 MG capsule Take 1 capsule (200 mg total) by mouth 3 (three) times daily as needed. Swallow whole, do not chew 01/08/24   Axell Trigueros, PA-C  fluticasone  (FLONASE ) 50 MCG/ACT nasal spray Place 2 sprays into both nostrils daily. 01/08/24   Jamin Panther, PA-C  lisinopril -hydrochlorothiazide  (ZESTORETIC ) 20-25 MG tablet TAKE 1 TABLET BY MOUTH DAILY 06/01/23   Bacchus, Gloria Z, FNP  montelukast  (SINGULAIR ) 10 MG tablet TAKE 1 TABLET BY MOUTH AT  BEDTIME 12/08/23    Bacchus, Gloria Z, FNP  naproxen  (NAPROSYN ) 500 MG tablet Take 1 tablet (500 mg total) by mouth 2 (two) times daily with a meal. 05/31/22   Cleotilde Rogue, MD  predniSONE  (DELTASONE ) 20 MG tablet Take 2 tablets (40 mg total) by mouth daily. 01/02/24   Cleotilde Rogue, MD  simvastatin  (ZOCOR ) 20 MG tablet TAKE 1 TABLET BY MOUTH IN THE  EVENING 01/13/24   Bacchus, Meade PEDLAR, FNP    Allergies: Taxotere  [docetaxel ]    Review of Systems  Respiratory:  Positive for cough.   All other systems reviewed and are negative.   Updated Vital Signs BP 133/70 (BP Location: Right Arm)   Pulse 94   Temp 99.2 F (37.3 C) (Oral)   Resp 20   Ht 5' 2 (1.575 m)   Wt 73.4 kg   SpO2 97%   BMI 29.61 kg/m   Physical Exam Vitals and nursing note reviewed.  Constitutional:      General: She is not in acute distress.    Appearance: Normal appearance. She is not ill-appearing or toxic-appearing.  HENT:     Mouth/Throat:     Mouth: Mucous membranes are moist.     Pharynx: Oropharynx is clear.  Cardiovascular:     Rate and Rhythm: Normal rate and regular rhythm.     Pulses: Normal pulses.  Pulmonary:     Effort: Pulmonary effort is normal. No  respiratory distress.     Breath sounds: No wheezing, rhonchi or rales.  Abdominal:     Palpations: Abdomen is soft.     Tenderness: There is no abdominal tenderness.  Musculoskeletal:        General: Normal range of motion.     Cervical back: No rigidity.     Right lower leg: No edema.     Left lower leg: No edema.  Lymphadenopathy:     Cervical: No cervical adenopathy.  Skin:    General: Skin is warm.     Capillary Refill: Capillary refill takes less than 2 seconds.  Neurological:     General: No focal deficit present.     Sensory: No sensory deficit.     Motor: No weakness.     (all labs ordered are listed, but only abnormal results are displayed) Labs Reviewed - No data to display  EKG: None  Radiology: No results found.   Procedures    Medications Ordered in the ED - No data to display                                  Medical Decision Making   Patient has been evaluated here several times recently for persistent cough.  She returns today was seen 6 days ago for same.  Had a chest x-ray on her previous visit without evidence of consolidations.  She was treated with albuterol  MDI and Tessalon  Perles.  She also recently finished a course of steroid.  Initially the Perles were helping her cough but she states her cough has returned.  She denies any new or worsening symptoms fever chills chest pain or shortness of breath.  No peripheral edema  On my exam, patient is very well-appearing.  Her vital signs are reassuring no tachycardia tachypnea or hypoxia.  She does not have any chest pain or shortness of breath.  Her lung sounds are clear to auscultation bilaterally.  Is ambulatory in the department with steady gait and able to speak in full and complete sentences without respiratory distress.   Patient has been having symptoms since the latter part of November, at this point, I think it would be reasonable to treat with a course of antibiotics.  She is also on ACE inhibitor which could be the cause of her persistent cough versus a viral URI, I have low clinical suspicion for ACS PE CHF exacerbation.    Amount and/or Complexity of Data Reviewed Discussion of management or test interpretation with external provider(s):   She had recent chest x-ray, declines COVID flu testing today.  She does not appear toxic on my exam.  Lung sounds are clear to auscultation bilaterally.  I feel that she is appropriate for discharge home, will try short course of guaifenesin  with codeine  and abx with the understanding that she will follow-up soon with her PCP as she may need to have her blood pressure medication changed  Risk OTC drugs. Prescription drug management.        Final diagnoses:  Acute cough    ED Discharge Orders      None          Herlinda Milling, PA-C 01/14/24 1802    Kammerer, Megan L, DO 01/19/24 [phone]

## 2024-01-14 NOTE — Discharge Instructions (Signed)
 Continue to use your albuterol  inhaler as directed.  Tylenol  if needed for fever.  You have been prescribed antibiotics and cough syrup.  Please take as directed.  Cough syrup can cause drowsiness so do not operate machinery or drive while taking the medication.  Please follow-up with your primary care provider for recheck.

## 2024-01-14 NOTE — ED Triage Notes (Signed)
 Pt arrived via POV c/o on-going persistent cough that Pt reports is worse at night. Pt reports she does not want to be tested for the Flu while here today. Pt reports he tessalon  pearls helped a little, but now her cough has worsened. Pt requesting a breathing treatment as well while she is here today. Pt has audible wheezing in Triage.

## 2024-01-17 ENCOUNTER — Other Ambulatory Visit: Payer: Self-pay

## 2024-01-17 ENCOUNTER — Emergency Department (HOSPITAL_COMMUNITY)
Admission: EM | Admit: 2024-01-17 | Discharge: 2024-01-17 | Disposition: A | Discharge status: Home / Self Care | Attending: Emergency Medicine | Admitting: Emergency Medicine

## 2024-01-17 ENCOUNTER — Emergency Department (HOSPITAL_COMMUNITY)

## 2024-01-17 DIAGNOSIS — R059 Cough, unspecified: Secondary | ICD-10-CM | POA: Diagnosis present

## 2024-01-17 DIAGNOSIS — R062 Wheezing: Secondary | ICD-10-CM | POA: Insufficient documentation

## 2024-01-17 DIAGNOSIS — Z79899 Other long term (current) drug therapy: Secondary | ICD-10-CM | POA: Insufficient documentation

## 2024-01-17 DIAGNOSIS — R0602 Shortness of breath: Secondary | ICD-10-CM | POA: Diagnosis present

## 2024-01-17 DIAGNOSIS — I10 Essential (primary) hypertension: Secondary | ICD-10-CM | POA: Diagnosis not present

## 2024-01-17 DIAGNOSIS — Z8673 Personal history of transient ischemic attack (TIA), and cerebral infarction without residual deficits: Secondary | ICD-10-CM | POA: Diagnosis not present

## 2024-01-17 DIAGNOSIS — J069 Acute upper respiratory infection, unspecified: Secondary | ICD-10-CM

## 2024-01-17 DIAGNOSIS — Z0000 Encounter for general adult medical examination without abnormal findings: Secondary | ICD-10-CM | POA: Diagnosis present

## 2024-01-17 LAB — CBG MONITORING, ED: Glucose-Capillary: 102 mg/dL — ABNORMAL HIGH (ref 70–99)

## 2024-01-17 LAB — RESP PANEL BY RT-PCR (RSV, FLU A&B, COVID)  RVPGX2
Influenza A by PCR: NEGATIVE
Influenza B by PCR: NEGATIVE
Resp Syncytial Virus by PCR: NEGATIVE
SARS Coronavirus 2 by RT PCR: NEGATIVE

## 2024-01-17 MED ORDER — IPRATROPIUM-ALBUTEROL 0.5-2.5 (3) MG/3ML IN SOLN
3.0000 mL | Freq: Once | RESPIRATORY_TRACT | Status: AC
  Administered 2024-01-17: 3 mL via RESPIRATORY_TRACT
  Filled 2024-01-17: qty 3

## 2024-01-17 MED ORDER — METHYLPREDNISOLONE 4 MG PO TBPK: ORAL_TABLET | ORAL | 0 refills | Status: DC

## 2024-01-17 NOTE — ED Triage Notes (Signed)
 Pt arrived and stated she has had this cough and it hasn't went away and been going on for awhile.

## 2024-01-17 NOTE — Discharge Instructions (Signed)
 Follow-up closely with your primary care doctor on an outpatient basis.  Return to emergency department immediately for any new or worsening symptoms.

## 2024-01-17 NOTE — ED Provider Notes (Signed)
 [place] EMERGENCY DEPARTMENT AT [facility] Provider Note   CSN: [id number] Arrival date & time: 01/17/24  9181     Patient presents with: Cough   Sheri Fleming is a 73 y.o. female.   Patient is a 74 year old female who presents emergency department the chief complaint of ongoing cough which has been present for approximate the past 2 weeks.  This is her fourth visit to the emergency department for the same complaint.  She notes that she has had no associated chest pain or shortness of breath.  She denies any increased swelling to her lower extremities.  She has had no abdominal pain, nausea, vomiting, diarrhea.  Her cough has been mildly productive in nature.  She does note that the cough is worse at night.  Her most recent visit to the emergency department was 3 days ago during which time she was placed on doxycycline  and guaifenesin  with codeine .  Patient denies any associated fever or chills.   Cough      Prior to Admission medications  Medication Sig Start Date End Date Taking? Authorizing Provider  albuterol  (VENTOLIN  HFA) 108 (90 Base) MCG/ACT inhaler Inhale 2 puffs into the lungs every 4 (four) hours as needed for wheezing or shortness of breath. 01/02/24   Cleotilde Rogue, MD  alendronate  (FOSAMAX ) 70 MG tablet TAKE 1 TABLET BY MOUTH WEEKLY  WITH 8 OZ OF PLAIN WATER 30  MINUTES BEFORE FIRST FOOD, DRINK OR MEDS. STAY UPRIGHT FOR 30  MINS 10/19/23   Bacchus, Meade PEDLAR, FNP  benzonatate  (TESSALON ) 200 MG capsule Take 1 capsule (200 mg total) by mouth 3 (three) times daily as needed. Swallow whole, do not chew 01/08/24   Triplett, Tammy, PA-C  doxycycline  (VIBRAMYCIN ) 100 MG capsule Take 1 capsule (100 mg total) by mouth 2 (two) times daily. 01/14/24   Triplett, Tammy, PA-C  fluticasone  (FLONASE ) 50 MCG/ACT nasal spray Place 2 sprays into both nostrils daily. 01/08/24   Triplett, Tammy, PA-C  guaiFENesin -codeine  100-10 MG/5ML syrup Take 10 mLs by mouth every 6 (six) hours  as needed for cough. May cause drowsiness 01/14/24   Triplett, Tammy, PA-C  lisinopril -hydrochlorothiazide  (ZESTORETIC ) 20-25 MG tablet TAKE 1 TABLET BY MOUTH DAILY 06/01/23   Bacchus, Meade PEDLAR, FNP  montelukast  (SINGULAIR ) 10 MG tablet TAKE 1 TABLET BY MOUTH AT  BEDTIME 12/08/23   Bacchus, Gloria Z, FNP  naproxen  (NAPROSYN ) 500 MG tablet Take 1 tablet (500 mg total) by mouth 2 (two) times daily with a meal. 05/31/22   Cleotilde Rogue, MD  predniSONE  (DELTASONE ) 20 MG tablet Take 2 tablets (40 mg total) by mouth daily. 01/02/24   Cleotilde Rogue, MD  simvastatin  (ZOCOR ) 20 MG tablet TAKE 1 TABLET BY MOUTH IN THE  EVENING 01/13/24   Bacchus, Meade PEDLAR, FNP    Allergies: Taxotere  [docetaxel ]    Review of Systems  Respiratory:  Positive for cough.   All other systems reviewed and are negative.   Updated Vital Signs BP 115/61   Pulse 88   Temp 97.9 F (36.6 C) (Oral)   Resp 18   Ht 5' 2 (1.575 m)   Wt 73 kg   SpO2 95%   BMI 29.44 kg/m   Physical Exam Vitals and nursing note reviewed.  Constitutional:      General: She is not in acute distress.    Appearance: Normal appearance. She is not ill-appearing.  HENT:     Head: Normocephalic and atraumatic.     Nose: Nose normal.  Mouth/Throat:     Mouth: Mucous membranes are moist.  Eyes:     Extraocular Movements: Extraocular movements intact.     Conjunctiva/sclera: Conjunctivae normal.     Pupils: Pupils are equal, round, and reactive to light.  Cardiovascular:     Rate and Rhythm: Normal rate and regular rhythm.     Pulses: Normal pulses.     Heart sounds: Normal heart sounds. No murmur heard.    No gallop.  Pulmonary:     Effort: Pulmonary effort is normal. No respiratory distress.     Breath sounds: No stridor. Wheezing present. No rhonchi or rales.  Abdominal:     General: Abdomen is flat. Bowel sounds are normal. There is no distension.     Palpations: Abdomen is soft.     Tenderness: There is no abdominal tenderness.  There is no guarding.  Musculoskeletal:        General: Normal range of motion.     Cervical back: Normal range of motion and neck supple. No rigidity or tenderness.     Right lower leg: No edema.     Left lower leg: No edema.  Skin:    General: Skin is warm and dry.     Findings: No rash.  Neurological:     General: No focal deficit present.     Mental Status: She is alert and oriented to person, place, and time. Mental status is at baseline.     Cranial Nerves: No cranial nerve deficit.     Sensory: No sensory deficit.     Motor: No weakness.     Coordination: Coordination normal.     Gait: Gait normal.  Psychiatric:        Mood and Affect: Mood normal.        Behavior: Behavior normal.        Thought Content: Thought content normal.        Judgment: Judgment normal.     (all labs ordered are listed, but only abnormal results are displayed) Labs Reviewed  RESP PANEL BY RT-PCR (RSV, FLU A&B, COVID)  [id number]    EKG: None  Radiology: No results found.   Procedures   Medications Ordered in the ED  ipratropium-albuterol  (DUONEB) 0.5-2.5 (3) MG/3ML nebulizer solution 3 mL (has no administration in time range)                                    Medical Decision Making Patient is doing well at this time and is stable for discharge home.  Discussed with patient that x-ray demonstrates no signs of acute consolidation and do not suspect pneumonia.  She is already currently on doxycycline  and guaifenesin  with codeine  at this point.  Will add on a course of Medrol  as well.  She was given a breathing treatment in the emergency department and does feel improved at this time.  Vital signs are stable with no indication for sepsis and no associated hypoxia.  Do not suspect any further workup is warranted at this time.  She has no indication for CHF.  Close follow-up with PCP was discussed as well as strict turn precautions for any new or worsening symptoms.  Patient voiced understanding  and had no additional questions.  Amount and/or Complexity of Data Reviewed Radiology: ordered.  Risk Prescription drug management.        Final diagnoses:  None    ED Discharge Orders  None          Daralene Lonni JONETTA DEVONNA 01/17/24 1014    Suzette Pac, MD 01/18/24 1136

## 2024-01-21 ENCOUNTER — Ambulatory Visit: Admitting: Family Medicine

## 2024-02-01 ENCOUNTER — Ambulatory Visit
Admission: EM | Admit: 2024-02-01 | Discharge: 2024-02-01 | Disposition: A | Discharge status: Home / Self Care | Attending: Nurse Practitioner | Admitting: Nurse Practitioner

## 2024-02-01 DIAGNOSIS — Z8709 Personal history of other diseases of the respiratory system: Secondary | ICD-10-CM | POA: Diagnosis not present

## 2024-02-01 DIAGNOSIS — R059 Cough, unspecified: Secondary | ICD-10-CM | POA: Diagnosis not present

## 2024-02-01 MED ORDER — GUAIFENESIN 100 MG/5ML PO LIQD: 10.0000 mL | Freq: Four times a day (QID) | ORAL | 0 refills | Status: AC | PRN

## 2024-02-01 NOTE — ED Triage Notes (Signed)
 Pt reports cough and congestion, was seen and treated for cough on 01/14/2024 and 01/17/2024 at [facility] denies fever the entire time states cough will subside for afew days then come back states she has had COVID and Flu testing both negative and has done chest x-rays. Hx of asthma. Has been using inhaler that was given.

## 2024-02-01 NOTE — Discharge Instructions (Addendum)
 Take medication as prescribed. You may take over-the-counter Tylenol  as needed for pain, fever, or general discomfort. Continue use of your albuterol  inhaler as needed for wheezing or shortness of breath. Recommend the use of a humidifier in your bedroom at nighttime during sleep and sleeping elevated on pillows while symptoms persist. Increase fluids and allow for plenty of rest. As discussed, your cough may last from days to weeks.  If you are generally feeling well, but continued to have a persistent nagging cough, continue over-the-counter cough and cold medications, cough drops, and fluids.  Seek care if you develop fever, wheezing, difficulty breathing, or other concerns. Follow-up as needed.

## 2024-02-01 NOTE — ED Provider Notes (Signed)
 " [facility]    CSN: [id number] Arrival date & time: 02/01/24  0857      History   Chief Complaint No chief complaint on file.   HPI Sheri Fleming is a 73 y.o. female.   The history is provided by the patient.   Patient presents for complaints of cough that is been present for the past 4 days.  Patient reports she recently was treated with steroids and antibiotics around the second week of this month.  Previous COVID and flu testing were negative, along with a negative chest x-ray.  She states symptoms improved, but over the past 4 days, cough has returned.  Patient denies fever, chills, headache, ear pain, nasal congestion, runny nose, chest pain, abdominal pain, nausea, vomiting, diarrhea, or rash.  She states that she does have an underlying history of asthma and is noted that she wheezes at night.  States that she has not taken any medications since her symptoms return. Past Medical History:  Diagnosis Date   Arthritis    Asthma    Cancer (HCC) 05/22/2021   breast cancer   High cholesterol    Hypertension    Port-A-Cath in place 08/12/2021   Sleep apnea     Patient Active Problem List   Diagnosis Date Noted   Muscle strain 03/16/2023   Prediabetes 10/27/2022   Leg cramps 10/27/2022   Obesity (BMI 30-39.9) 09/20/2021   Encounter for general adult medical examination with abnormal findings 06/11/2021   Malignant neoplasm of upper-outer quadrant of left breast in female, estrogen receptor negative (HCC) 05/24/2021   Hypertension 05/21/2021   Asthma 05/21/2021   Osteoporosis 05/21/2021   Hyperlipidemia 05/21/2021    Past Surgical History:  Procedure Laterality Date   ABDOMINAL HYSTERECTOMY     BREAST LUMPECTOMY WITH RADIOACTIVE SEED AND SENTINEL LYMPH NODE BIOPSY Left 06/26/2021   Procedure: LEFT BREAST LUMPECTOMY WITH RADIOACTIVE SEED AND SENTINEL LYMPH NODE BIOPSY;  Surgeon: Curvin Deward MOULD, MD;  Location: [facility];  Service: General;  Laterality: Left;   GEN & PEC BLOCK   FRACTURE SURGERY Left    left lower leg   PORT-A-CATH REMOVAL Right 12/20/2021   Procedure: REMOVAL PORT-A-CATH;  Surgeon: Curvin Deward MOULD, MD;  Location: [facility];  Service: General;  Laterality: Right;   PORTACATH PLACEMENT N/A 06/26/2021   Procedure: PORT PLACEMENT;  Surgeon: Curvin Deward MOULD, MD;  Location: [facility] OR;  Service: General;  Laterality: N/A;    OB History     Gravida  4   Para  4   Term  4   Preterm      AB      Living  4      SAB      IAB      Ectopic      Multiple      Live Births               Home Medications    Prior to Admission medications  Medication Sig Start Date End Date Taking? Authorizing Provider  albuterol  (VENTOLIN  HFA) 108 (90 Base) MCG/ACT inhaler Inhale 2 puffs into the lungs every 4 (four) hours as needed for wheezing or shortness of breath. 01/02/24   Cleotilde Rogue, MD  alendronate  (FOSAMAX ) 70 MG tablet TAKE 1 TABLET BY MOUTH WEEKLY  WITH 8 OZ OF PLAIN WATER 30  MINUTES BEFORE FIRST FOOD, DRINK OR MEDS. STAY UPRIGHT FOR 30  MINS 10/19/23   Bacchus, Meade PEDLAR, FNP  fluticasone  (  FLONASE ) 50 MCG/ACT nasal spray Place 2 sprays into both nostrils daily. 01/08/24   Triplett, Tammy, PA-C  lisinopril -hydrochlorothiazide  (ZESTORETIC ) 20-25 MG tablet TAKE 1 TABLET BY MOUTH DAILY 06/01/23   Edman Meade PEDLAR, FNP  montelukast  (SINGULAIR ) 10 MG tablet TAKE 1 TABLET BY MOUTH AT  BEDTIME 12/08/23   Bacchus, Meade PEDLAR, FNP  naproxen  (NAPROSYN ) 500 MG tablet Take 1 tablet (500 mg total) by mouth 2 (two) times daily with a meal. 05/31/22   Cleotilde Rogue, MD  simvastatin  (ZOCOR ) 20 MG tablet TAKE 1 TABLET BY MOUTH IN THE  EVENING 01/13/24   Bacchus, Meade PEDLAR, FNP    Family History Family History  Problem Relation Age of Onset   Stroke Mother    Hypertension Mother    Lung cancer Father        d. 84s   Vaginal cancer Sister 39   Cancer Brother        unknown type; dx 53s; mets   Hypertension Son    Stroke Son     Kidney failure Son    Hypercholesterolemia Son    Stroke Other    Colon cancer Neg Hx    Breast cancer Neg Hx     Social History Social History[1]   Allergies   Taxotere  [docetaxel ]   Review of Systems Review of Systems Per HPI  Physical Exam Triage Vital Signs ED Triage Vitals  Encounter Vitals Group     BP 02/01/24 0931 (!) 148/81     Girls Systolic BP Percentile --      Girls Diastolic BP Percentile --      Boys Systolic BP Percentile --      Boys Diastolic BP Percentile --      Pulse Rate 02/01/24 0931 94     Resp 02/01/24 0931 20     Temp 02/01/24 0931 98.3 F (36.8 C)     Temp Source 02/01/24 0931 Oral     SpO2 02/01/24 0931 95 %     Weight --      Height --      Head Circumference --      Peak Flow --      Pain Score 02/01/24 0935 0     Pain Loc --      Pain Education --      Exclude from Growth Chart --    No data found.  Updated Vital Signs BP (!) 148/81 (BP Location: Right Arm)   Pulse 94   Temp 98.3 F (36.8 C) (Oral)   Resp 20   SpO2 95%   Visual Acuity Right Eye Distance:   Left Eye Distance:   Bilateral Distance:    Right Eye Near:   Left Eye Near:    Bilateral Near:     Physical Exam Vitals and nursing note reviewed.  Constitutional:      General: She is not in acute distress.    Appearance: Normal appearance.  HENT:     Head: Normocephalic.     Right Ear: Tympanic membrane, ear canal and external ear normal.     Left Ear: Tympanic membrane, ear canal and external ear normal.     Nose: Nose normal.     Right Turbinates: Not enlarged or swollen.     Left Turbinates: Not enlarged or swollen.     Right Sinus: No maxillary sinus tenderness or frontal sinus tenderness.     Left Sinus: No maxillary sinus tenderness or frontal sinus tenderness.     Mouth/Throat:  Lips: Pink.     Mouth: Mucous membranes are moist.     Pharynx: Uvula midline. No pharyngeal swelling, oropharyngeal exudate, posterior oropharyngeal erythema, uvula  swelling or postnasal drip.  Eyes:     Extraocular Movements: Extraocular movements intact.     Conjunctiva/sclera: Conjunctivae normal.     Pupils: Pupils are equal, round, and reactive to light.  Cardiovascular:     Rate and Rhythm: Normal rate and regular rhythm.     Pulses: Normal pulses.     Heart sounds: Normal heart sounds.  Pulmonary:     Effort: Pulmonary effort is normal. No respiratory distress.     Breath sounds: Normal breath sounds. No stridor. No wheezing, rhonchi or rales.  Abdominal:     General: Bowel sounds are normal.     Palpations: Abdomen is soft.  Musculoskeletal:     Cervical back: Normal range of motion.  Skin:    General: Skin is warm and dry.  Neurological:     General: No focal deficit present.     Mental Status: She is alert and oriented to person, place, and time.  Psychiatric:        Mood and Affect: Mood normal.        Behavior: Behavior normal.      UC Treatments / Results  Labs (all labs ordered are listed, but only abnormal results are displayed) Labs Reviewed - No data to display  EKG   Radiology No results found.  Procedures Procedures (including critical care time)  Medications Ordered in UC Medications - No data to display  Initial Impression / Assessment and Plan / UC Course  I have reviewed the triage vital signs and the nursing notes.  Pertinent labs & imaging results that were available during my care of the patient were reviewed by me and considered in my medical decision making (see chart for details).  On exam, the patient's lung sounds are clear throughout, room air sats are at 95%.  She is well-appearing, is in no acute distress, and vital signs are stable. Patient with history of asthma, no wheezing, crackles, or rhonchi noted on exam, will defer further imaging at this time.  Differential diagnoses include postviral cough versus new viral infection.  She does not exhibit any wheezing or shortness of breath on exam,  will defer use of corticosteroids at this time.  Will provide symptomatic treatment with guaifenesin  100 mg.  Supportive care recommendations were provided and discussed with the patient to include fluids, rest, over-the-counter analgesics, and use of a humidifier during sleep.  Discussed indications with the patient regarding follow-up.  Patient was in agreement with this plan of care and verbalizes understanding.  All questions were answered.  Patient stable for discharge.   Final Clinical Impressions(s) / UC Diagnoses   Final diagnoses:  None   Discharge Instructions   None    ED Prescriptions   None    PDMP not reviewed this encounter.     [1]  Social History Tobacco Use   Smoking status: Never   Smokeless tobacco: Never  Vaping Use   Vaping status: Never Used  Substance Use Topics   Alcohol use: No   Drug use: No     Gilmer Etta PARAS, NP 02/01/24 1002  "

## 2024-02-04 ENCOUNTER — Encounter (HOSPITAL_COMMUNITY): Payer: Self-pay

## 2024-02-04 ENCOUNTER — Other Ambulatory Visit: Payer: Self-pay

## 2024-02-04 ENCOUNTER — Emergency Department (HOSPITAL_COMMUNITY)
Admission: EM | Admit: 2024-02-04 | Discharge: 2024-02-04 | Disposition: A | Discharge status: Home / Self Care | Attending: Emergency Medicine | Admitting: Emergency Medicine

## 2024-02-04 DIAGNOSIS — Z79899 Other long term (current) drug therapy: Secondary | ICD-10-CM | POA: Insufficient documentation

## 2024-02-04 DIAGNOSIS — Z853 Personal history of malignant neoplasm of breast: Secondary | ICD-10-CM | POA: Diagnosis not present

## 2024-02-04 DIAGNOSIS — R059 Cough, unspecified: Secondary | ICD-10-CM | POA: Diagnosis present

## 2024-02-04 DIAGNOSIS — R062 Wheezing: Secondary | ICD-10-CM | POA: Diagnosis not present

## 2024-02-04 DIAGNOSIS — I10 Essential (primary) hypertension: Secondary | ICD-10-CM | POA: Insufficient documentation

## 2024-02-04 DIAGNOSIS — Z7951 Long term (current) use of inhaled steroids: Secondary | ICD-10-CM | POA: Insufficient documentation

## 2024-02-04 DIAGNOSIS — J45901 Unspecified asthma with (acute) exacerbation: Secondary | ICD-10-CM

## 2024-02-04 MED ORDER — PREDNISONE 20 MG PO TABS: 40.0000 mg | ORAL_TABLET | Freq: Every day | ORAL | 0 refills | Status: AC

## 2024-02-04 MED ORDER — PULMICORT FLEXHALER 90 MCG/ACT IN AEPB: 1.0000 | INHALATION_SPRAY | Freq: Two times a day (BID) | RESPIRATORY_TRACT | 0 refills | Status: DC

## 2024-02-04 MED ORDER — PREDNISONE 20 MG PO TABS
40.0000 mg | ORAL_TABLET | Freq: Once | ORAL | Status: AC
  Administered 2024-02-04: 40 mg via ORAL
  Filled 2024-02-04: qty 2

## 2024-02-04 MED ORDER — IPRATROPIUM-ALBUTEROL 0.5-2.5 (3) MG/3ML IN SOLN
3.0000 mL | Freq: Once | RESPIRATORY_TRACT | Status: AC
  Administered 2024-02-04: 3 mL via RESPIRATORY_TRACT
  Filled 2024-02-04: qty 3

## 2024-02-04 NOTE — Discharge Instructions (Addendum)
 You are seen today for continued cough and wheezing.  This is likely due to your asthma.  Since you are having repeated visits for persistent cough and wheezing that only are helped with steroids, we are to treat with a brief course of steroids and start you on a daily steroid inhaler, this can be increased by your primary care doctor as needed but should help prevent your symptoms.  This medication is not a rescue inhaler for when you feel short of breath or are wheezing.  You did still use your albuterol  rescue inhaler as needed for the symptoms.  Referred you for further evaluation with pulmonology.

## 2024-02-04 NOTE — ED Triage Notes (Signed)
 Pt arrived via POV c/o recurrent cough. Pt reports being seen recently for same and  reports the only medication that helps is when she was prescribed steroids. Pt reports cough syrup and antibiotics do not help.

## 2024-02-04 NOTE — ED Notes (Signed)
 Patient completed nebulizer treatment, reports she is breathing better after the treatment.

## 2024-02-04 NOTE — ED Provider Notes (Signed)
 " [place] [facility] Provider Note   CSN: [id number] Arrival date & time: 02/04/24  1142     Patient presents with: Cough   Sheri Fleming is a 74 y.o. female.  She is history of asthma, hypertension, osteoporosis, breast cancer.  Presents ER today for persistent cough, which is ongoing problem since approximately November 28 or 29.  She states the other times she has had improvement in her symptoms as when she is prescribed steroids.  She has an inhaler with temporary relief.  She has had 4 ED visits and an urgent care visit since the initial onset.  Was recently she went to urgent care on 12/29, and was not have any wheezing at that time, was not prescribed steroids was given guaifenesin .  States cough is present generally clear to white sputum, she denies fevers or chills or chest pain or shortness of breath.    Cough      Prior to Admission medications  Medication Sig Start Date End Date Taking? Authorizing Provider  albuterol  (VENTOLIN  HFA) 108 (90 Base) MCG/ACT inhaler Inhale 2 puffs into the lungs every 4 (four) hours as needed for wheezing or shortness of breath. 01/02/24   Cleotilde Rogue, MD  alendronate  (FOSAMAX ) 70 MG tablet TAKE 1 TABLET BY MOUTH WEEKLY  WITH 8 OZ OF PLAIN WATER 30  MINUTES BEFORE FIRST FOOD, DRINK OR MEDS. STAY UPRIGHT FOR 30  MINS 10/19/23   Bacchus, Meade PEDLAR, FNP  fluticasone  (FLONASE ) 50 MCG/ACT nasal spray Place 2 sprays into both nostrils daily. 01/08/24   Triplett, Tammy, PA-C  guaiFENesin  (ROBITUSSIN) 100 MG/5ML liquid Take 10 mLs by mouth every 6 (six) hours as needed for cough or to loosen phlegm. 02/01/24   Leath-Warren, Etta PARAS, NP  lisinopril -hydrochlorothiazide  (ZESTORETIC ) 20-25 MG tablet TAKE 1 TABLET BY MOUTH DAILY 06/01/23   Bacchus, Gloria Z, FNP  montelukast  (SINGULAIR ) 10 MG tablet TAKE 1 TABLET BY MOUTH AT  BEDTIME 12/08/23   Bacchus, Gloria Z, FNP  naproxen  (NAPROSYN ) 500 MG tablet Take 1 tablet (500 mg  total) by mouth 2 (two) times daily with a meal. 05/31/22   Cleotilde Rogue, MD  simvastatin  (ZOCOR ) 20 MG tablet TAKE 1 TABLET BY MOUTH IN THE  EVENING 01/13/24   Bacchus, Meade PEDLAR, FNP    Allergies: Taxotere  [docetaxel ]    Review of Systems  Respiratory:  Positive for cough.     Updated Vital Signs BP 132/71 (BP Location: Right Arm)   Pulse 86   Temp 97.8 F (36.6 C) (Temporal)   Resp 18   Ht 5' 2 (1.575 m)   Wt 73 kg   SpO2 97%   BMI 29.44 kg/m   Physical Exam Vitals and nursing note reviewed.  Constitutional:      General: She is not in acute distress.    Appearance: She is well-developed.  HENT:     Head: Normocephalic and atraumatic.     Right Ear: Tympanic membrane normal.     Left Ear: Tympanic membrane normal.     Nose: Nose normal.     Mouth/Throat:     Mouth: Mucous membranes are moist.  Eyes:     Extraocular Movements: Extraocular movements intact.     Conjunctiva/sclera: Conjunctivae normal.     Pupils: Pupils are equal, round, and reactive to light.  Cardiovascular:     Rate and Rhythm: Normal rate and regular rhythm.     Heart sounds: No murmur heard. Pulmonary:  Effort: Pulmonary effort is normal. No respiratory distress.     Breath sounds: Wheezing present.     Comments: Mild diffuse expiratory wheezing. Abdominal:     Palpations: Abdomen is soft.     Tenderness: There is no abdominal tenderness.  Musculoskeletal:        General: No swelling.     Cervical back: Neck supple.  Skin:    General: Skin is warm and dry.     Capillary Refill: Capillary refill takes less than 2 seconds.  Neurological:     General: No focal deficit present.     Mental Status: She is alert and oriented to person, place, and time.  Psychiatric:        Mood and Affect: Mood normal.     (all labs ordered are listed, but only abnormal results are displayed) Labs Reviewed - No data to display  EKG: None  Radiology: No results found.   Procedures    Medications Ordered in the ED - No data to display                                  Medical Decision Making Differential diagnosis includes but not limited to asthma exacerbation, pneumonia, viral URI, bronchitis, PE, CHF, other  ED course: Side effects-year-old, history of breast cancer in remission, history of asthma but on no daily controller medications has been having persistent cough that is only occasionally pressure of clear to white sputum.  She states when she takes steroids it completely goes away but after several days it seems to come back.  She has had recent negative COVID and flu testing, she has had negative chest x-rays during the past few weeks.  She has normal vital signs but is noted to have some diffuse expiratory wheezing on exam.  She is not having chest pain or shortness of breath and oxygen is 100% on room air she is not tachycardic or tachypneic.  I do not suspect PE, she has no signs of volume overload or history of heart failure to suggest CHF as cause of her cough. Patient was given albuterol  nebulizer treatment and has improvement of her was also given prednisone .  Discussed with patient we will give her short course of prednisone  for her acute symptoms but want her to start budesonide  daily.  Initially the plan was to just have her follow-up closely with PCP, though she states she has not been able to get in due to the PCP being on her new leave.  I feel it would be judicious to start her on Pulmicort  and have her follow-up with pulmonology.  We discussed with patient that the Pulmicort  is not rescue inhaler she should continue using her albuterol  rescue inhaler for acute symptoms but should take the budesonide  twice a day to help control her symptoms.  Risk Prescription drug management.        Final diagnoses:  None    ED Discharge Orders     None          Suellen Sherran DELENA DEVONNA 02/04/24 1336    Yolande Lamar BROCKS, MD 02/12/24 1001  "

## 2024-02-21 ENCOUNTER — Other Ambulatory Visit: Payer: Self-pay | Admitting: Family Medicine

## 2024-03-01 ENCOUNTER — Telehealth: Payer: Self-pay | Admitting: Internal Medicine

## 2024-03-01 NOTE — Telephone Encounter (Signed)
 Copied from CRM #[id number]. Topic: General - Other >> Mar 01, 2024 12:26 PM Benton O wrote: Reason for CRM: patient is wanting to know is the parking lot clear of ice for her appointment tomorrow at 8:45 am patient is 74 years old and does not want to fall . Tried calling the clinic access line but no response . Please reach out to patient with this information please  [phone]  Called and spoke with patient and informer her our parking lot was clear, but if the snow/ice melted today, it may re-freeze and there may be some ice in the parking lot tomorrow morning.  She voiced her understanding and stated she will be here tomorrow

## 2024-03-02 ENCOUNTER — Encounter: Payer: Self-pay | Admitting: Internal Medicine

## 2024-03-02 ENCOUNTER — Ambulatory Visit: Admitting: Internal Medicine

## 2024-03-02 VITALS — BP 126/74 | HR 93 | Ht 62.0 in | Wt 155.0 lb

## 2024-03-02 DIAGNOSIS — I10 Essential (primary) hypertension: Secondary | ICD-10-CM | POA: Diagnosis not present

## 2024-03-02 DIAGNOSIS — J4520 Mild intermittent asthma, uncomplicated: Secondary | ICD-10-CM | POA: Diagnosis not present

## 2024-03-02 MED ORDER — VALSARTAN-HYDROCHLOROTHIAZIDE 160-12.5 MG PO TABS: 1.0000 | ORAL_TABLET | Freq: Every day | ORAL | 11 refills | Status: AC

## 2024-03-02 NOTE — Assessment & Plan Note (Addendum)
 D/c ACEi 03/02/2024 due to raspy voice and severe flares of asthma dating back 20 plus years   In the best review of chronic cough to date ( NEJM 2016 375 [id number]) ,  ACEi are now felt to cause cough in up to  20% of pts which is a 4 fold increase from previous reports and does not include the variety of non-specific complaints we see in pulmonary clinic in pts on ACEi but previously attributed to another dx like  Copd/asthma and  include PNDS, throat and chest congestion, bronchitis, unexplained dyspnea and noct strangling sensations, and hoarseness, but also  atypical /refractory GERD symptoms like dysphagia and bad heartburn   The only way I know  to prove this is not an ACEi Case is a trial off ACEi x a minimum of 6 weeks then regroup.   >>> try valsartan  160-12.5 one daily   F/u in 6 weeks with all meds in hand using a trust but verify approach to confirm accurate Medication  Reconciliation The principal here is that until we are certain that the  patients are doing what we've asked, it makes no sense to ask them to do more.          Each maintenance medication was reviewed in detail including emphasizing most importantly the difference between maintenance and prns and under what circumstances the prns are to be triggered using an action plan format where appropriate.  Total time for H and P, chart review, counseling, reviewing hfa device(s) and generating customized AVS unique to this office visit / same day charting = 62 min   For patient with   refractory recurrent respiratory  symptoms of uncertain etiology

## 2024-03-02 NOTE — Patient Instructions (Addendum)
 Stop lisinopril  and start Valsartan  160 -12.5 mg  one daily in it's place    Work on inhaler technique:  relax and gently blow all the way out then take a nice smooth full deep breath back in, triggering the inhaler at same time you start breathing in.  Hold breath in for at least  5 seconds if you can.   Use your albuterol  as a rescue medication to be used if you can't catch your breath by resting, slowing your pace,  or doing a relaxed purse lip breathing pattern.  - The less you use it, the better it will work when you need it. - Ok to use up to 2 puffs  every 4 hours if you must but call for  appointment if use goes up over your usual need - Don't leave home without it !!  (think of it like a spare tire or starter fluid for your car)   Please schedule a follow up office visit in 6 weeks, call sooner if needed with all medications /inhalers/ solutions in hand so we can verify exactly what you are taking. This includes all medications from all doctors and over the counters

## 2024-03-02 NOTE — Progress Notes (Signed)
 "   Sheri Fleming, female    DOB: 01/23/1951    MRN: [id number]   Brief patient profile:  30  yobf  never smoker asthma as child better by HS  referred to pulmonary clinic in [place]  03/02/2024 by EDP at [facility]  for severe intermittent asthma onset around 2007 while on ACEi  Pt not previously seen by [facility] service.     History of Present Illness  03/02/2024  Pulmonary/ 1st office eval/ [facility] Lank / [place] [facility] / still on ACEi Chief Complaint  Patient presents with   Establish Care    Asthma // caught cold but that is better now  Dyspnea:  denies  limiting sob and only having symptoms with colds but when she has them they are getting worse and worse and req ER eval and rec for neb which she say she can't afford  Cough: raspy voice  Sleep: flat bed with 3 pillows  SABA use: not now  02: none     No obvious day to day or daytime pattern/variability or assoc excess/ purulent sputum or mucus plugs or hemoptysis or cp or chest tightness, subjective wheeze or overt sinus or hb symptoms.    Also denies any obvious fluctuation of symptoms with weather or environmental changes or other aggravating or alleviating factors except as outlined above   No unusual exposure hx or h/o childhood pna or knowledge of premature birth.  Current Allergies, Complete Past Medical History, Past Surgical History, Family History, and Social History were reviewed in [employer] record.  ROS  The following are not active complaints unless bolded Hoarseness, sore throat, dysphagia, dental problems, itching, sneezing,  nasal congestion or discharge of excess mucus or purulent secretions, ear ache,   fever, chills, sweats, unintended wt loss or wt gain, classically pleuritic or exertional cp,  orthopnea pnd or arm/hand swelling  or leg swelling, presyncope, palpitations, abdominal pain, anorexia, nausea, vomiting, diarrhea  or change in bowel habits or change in bladder habits, change in stools  or change in urine, dysuria, hematuria,  rash, arthralgias, visual complaints, headache, numbness, weakness or ataxia or problems with walking or coordination,  change in mood or  memory.            Outpatient Medications Prior to Visit  Medication Sig Dispense Refill   albuterol  (VENTOLIN  HFA) 108 (90 Base) MCG/ACT inhaler Inhale 2 puffs into the lungs every 4 (four) hours as needed for wheezing or shortness of breath. 1 each 3   alendronate  (FOSAMAX ) 70 MG tablet TAKE 1 TABLET BY MOUTH WEEKLY  WITH 8 OZ OF PLAIN WATER 30  MINUTES BEFORE FIRST FOOD, DRINK OR MEDS. STAY UPRIGHT FOR 30  MINS 12 tablet 3   lisinopril -hydrochlorothiazide  (ZESTORETIC ) 20-25 MG tablet TAKE 1 TABLET BY MOUTH DAILY 100 tablet 2   montelukast  (SINGULAIR ) 10 MG tablet TAKE 1 TABLET BY MOUTH AT  BEDTIME 100 tablet 2   simvastatin  (ZOCOR ) 20 MG tablet TAKE 1 TABLET BY MOUTH IN THE  EVENING 100 tablet 2   Budesonide  (PULMICORT  FLEXHALER) 90 MCG/ACT inhaler Inhale 1 puff into the lungs 2 (two) times daily. (Patient not taking: Reported on 03/02/2024) 1 each 0   fluticasone  (FLONASE ) 50 MCG/ACT nasal spray Place 2 sprays into both nostrils daily. (Patient not taking: Reported on 03/02/2024) 11.1 mL 1   guaiFENesin  (ROBITUSSIN) 100 MG/5ML liquid Take 10 mLs by mouth every 6 (six) hours as needed for cough or to loosen phlegm. (Patient not taking: Reported on  03/02/2024) 300 mL 0   naproxen  (NAPROSYN ) 500 MG tablet Take 1 tablet (500 mg total) by mouth 2 (two) times daily with a meal. (Patient not taking: Reported on 03/02/2024) 30 tablet 0   No facility-administered medications prior to visit.    Past Medical History:  Diagnosis Date   Arthritis    Asthma    Cancer (HCC) 05/22/2021   breast cancer   High cholesterol    Hypertension    Port-A-Cath in place 08/12/2021   Sleep apnea       Objective:     BP 126/74   Pulse 93   Ht 5' 2 (1.575 m)   Wt 155 lb (70.3 kg)   SpO2 98% Comment: ra  BMI 28.35 kg/m    SpO2: 98 % (ra)  very pleasant amb bf raspy voice   HEENT : Oropharynx  clear/ edentulous  Nasal turbinates nl     NECK :  without  apparent JVD/ palpable Nodes/TM    LUNGS: no acc muscle use,  Nl contour chest which is clear to A and P bilaterally without cough on insp or exp maneuvers   CV:  RRR  no s3 or murmur or increase in P2, and no edema   ABD:  soft and nontender   MS:  Gait nl   ext warm without deformities Or obvious joint restrictions  calf tenderness, cyanosis or clubbing    SKIN: warm and dry without lesions    NEURO:  alert, approp, nl sensorium with  no motor or cerebellar deficits apparent.       Assessment   Assessment & Plan Mild intermittent asthma without complication Onset in childhood on shots/ gone by HS  - recurrent around 2007 intermittent and severe while on ACEi > d/c 03/02/2024  - Eos 0.1  07/20/23  - 03/02/2024  After extensive coaching inhaler device,  effectiveness =   50% hfa   DDX of  difficult airways management almost all start with A and  include Adherence, Ace Inhibitors, Acid Reflux, Active Sinus Disease, Alpha 1 Antitripsin deficiency, Anxiety masquerading as Airways dz,  ABPA,  Allergy(esp in young), Aspiration (esp in elderly), Adverse effects of meds,  Active smoking or vaping, A bunch of PE's (a small clot burden can't cause this syndrome unless there is already severe underlying pulm or vascular dz with poor reserve) plus two Bs  = Bronchiectasis and Beta blocker use..and one C= CHF   Adherence is always the initial prime suspect and is a multilayered concern that requires a trust but verify approach in every patient - starting with knowing how to use medications, especially inhalers, correctly, keeping up with refills and understanding the fundamental difference between maintenance and prns vs those medications only taken for a very short course and then stopped and not refilled.  - see hfa teaching and instructions  ACEi adverse  effects also  at the  top of the usual list of suspects and the only way to rule it out is a trial off > see a/p    Acid reflux a concern on fosamax  but will try off ACEi first   ? Allergy /ABPA > doubt with low eos and h/o colds not assoc with seasonal rhinitis makes this less likely an atopic issue.   I really suspect the pattern of severe intermittent episodes of refractory cough and wheeze can be eliminated by either stopping the acei or treating occult reflux or both.         Essential  hypertension D/c ACEi 03/02/2024 due to raspy voice and severe flares of asthma dating back 20 plus years   In the best review of chronic cough to date ( NEJM 2016 375 [id number]) ,  ACEi are now felt to cause cough in up to  20% of pts which is a 4 fold increase from previous reports and does not include the variety of non-specific complaints we see in pulmonary clinic in pts on ACEi but previously attributed to another dx like  Copd/asthma and  include PNDS, throat and chest congestion, bronchitis, unexplained dyspnea and noct strangling sensations, and hoarseness, but also  atypical /refractory GERD symptoms like dysphagia and bad heartburn   The only way I know  to prove this is not an ACEi Case is a trial off ACEi x a minimum of 6 weeks then regroup.   >>> try valsartan  160-12.5 one daily   F/u in 6 weeks with all meds in hand using a trust but verify approach to confirm accurate Medication  Reconciliation The principal here is that until we are certain that the  patients are doing what we've asked, it makes no sense to ask them to do more.          Each maintenance medication was reviewed in detail including emphasizing most importantly the difference between maintenance and prns and under what circumstances the prns are to be triggered using an action plan format where appropriate.  Total time for H and P, chart review, counseling, reviewing hfa device(s) and generating customized AVS  unique to this office visit / same day charting = 62 min   For patient with   refractory recurrent respiratory  symptoms of uncertain etiology          AVS  Patient Instructions  Stop lisinopril  and start Valsartan  160 -12.5 mg  one daily it's place    Work on inhaler technique:  relax and gently blow all the way out then take a nice smooth full deep breath back in, triggering the inhaler at same time you start breathing in.  Hold breath in for at least  5 seconds if you can.   Use your albuterol  as a rescue medication to be used if you can't catch your breath by resting, slowing your pace,  or doing a relaxed purse lip breathing pattern.  - The less you use it, the better it will work when you need it. - Ok to use up to 2 puffs  every 4 hours if you must but call for  appointment if use goes up over your usual need - Don't leave home without it !!  (think of it like a spare tire or starter fluid for your car)   Please schedule a follow up office visit in 6 weeks, call sooner if needed with all medications /inhalers/ solutions in hand so we can verify exactly what you are taking. This includes all medications from all doctors and over the counters       Ozell America, MD 03/02/2024      "

## 2024-03-02 NOTE — Assessment & Plan Note (Addendum)
 Onset in childhood on shots/ gone by HS  - recurrent around 2007 intermittent and severe while on ACEi > d/c 03/02/2024  - Eos 0.1  07/20/23  - 03/02/2024  After extensive coaching inhaler device,  effectiveness =   50% hfa   DDX of  difficult airways management almost all start with A and  include Adherence, Ace Inhibitors, Acid Reflux, Active Sinus Disease, Alpha 1 Antitripsin deficiency, Anxiety masquerading as Airways dz,  ABPA,  Allergy(esp in young), Aspiration (esp in elderly), Adverse effects of meds,  Active smoking or vaping, A bunch of PE's (a small clot burden can't cause this syndrome unless there is already severe underlying pulm or vascular dz with poor reserve) plus two Bs  = Bronchiectasis and Beta blocker use..and one C= CHF   Adherence is always the initial prime suspect and is a multilayered concern that requires a trust but verify approach in every patient - starting with knowing how to use medications, especially inhalers, correctly, keeping up with refills and understanding the fundamental difference between maintenance and prns vs those medications only taken for a very short course and then stopped and not refilled.  - see hfa teaching and instructions  ACEi adverse effects also  at the  top of the usual list of suspects and the only way to rule it out is a trial off > see a/p    Acid reflux a concern on fosamax  but will try off ACEi first   ? Allergy /ABPA > doubt with low eos and h/o colds not assoc with seasonal rhinitis makes this less likely an atopic issue.   I really suspect the pattern of severe intermittent episodes of refractory cough and wheeze can be eliminated by either stopping the acei or treating occult reflux or both.

## 2024-03-21 ENCOUNTER — Ambulatory Visit: Admitting: Family Medicine

## 2024-03-23 ENCOUNTER — Ambulatory Visit: Payer: Self-pay | Admitting: Nurse Practitioner

## 2024-03-28 ENCOUNTER — Ambulatory Visit: Payer: 59

## 2024-04-20 ENCOUNTER — Ambulatory Visit: Admitting: Internal Medicine

## 2024-09-05 ENCOUNTER — Ambulatory Visit: Admitting: Hematology and Oncology
